# Patient Record
Sex: Female | Born: 1940
Health system: Southern US, Community
[De-identification: ages and names within clinical notes are randomized; demographics above are authoritative.]

## PROBLEM LIST (undated history)

## (undated) DIAGNOSIS — R3129 Other microscopic hematuria: Secondary | ICD-10-CM

## (undated) DIAGNOSIS — M353 Polymyalgia rheumatica: Secondary | ICD-10-CM

## (undated) DIAGNOSIS — K449 Diaphragmatic hernia without obstruction or gangrene: Secondary | ICD-10-CM

## (undated) DIAGNOSIS — R9431 Abnormal electrocardiogram [ECG] [EKG]: Secondary | ICD-10-CM

## (undated) DIAGNOSIS — K219 Gastro-esophageal reflux disease without esophagitis: Secondary | ICD-10-CM

## (undated) DIAGNOSIS — E559 Vitamin D deficiency, unspecified: Secondary | ICD-10-CM

## (undated) DIAGNOSIS — K760 Fatty (change of) liver, not elsewhere classified: Secondary | ICD-10-CM

## (undated) DIAGNOSIS — R9389 Abnormal findings on diagnostic imaging of other specified body structures: Secondary | ICD-10-CM

## (undated) DIAGNOSIS — R42 Dizziness and giddiness: Secondary | ICD-10-CM

## (undated) DIAGNOSIS — G8929 Other chronic pain: Secondary | ICD-10-CM

## (undated) DIAGNOSIS — Z1211 Encounter for screening for malignant neoplasm of colon: Secondary | ICD-10-CM

## (undated) DIAGNOSIS — M797 Fibromyalgia: Secondary | ICD-10-CM

## (undated) DIAGNOSIS — E538 Deficiency of other specified B group vitamins: Secondary | ICD-10-CM

## (undated) DIAGNOSIS — I10 Essential (primary) hypertension: Secondary | ICD-10-CM

## (undated) DIAGNOSIS — Z8719 Personal history of other diseases of the digestive system: Secondary | ICD-10-CM

## (undated) DIAGNOSIS — M47812 Spondylosis without myelopathy or radiculopathy, cervical region: Secondary | ICD-10-CM

## (undated) DIAGNOSIS — H269 Unspecified cataract: Secondary | ICD-10-CM

## (undated) DIAGNOSIS — M791 Myalgia, unspecified site: Secondary | ICD-10-CM

## (undated) DIAGNOSIS — M810 Age-related osteoporosis without current pathological fracture: Secondary | ICD-10-CM

## (undated) DIAGNOSIS — N8110 Cystocele, unspecified: Secondary | ICD-10-CM

## (undated) DIAGNOSIS — L501 Idiopathic urticaria: Secondary | ICD-10-CM

## (undated) DIAGNOSIS — M19012 Primary osteoarthritis, left shoulder: Secondary | ICD-10-CM

## (undated) DIAGNOSIS — Z87440 Personal history of urinary (tract) infections: Secondary | ICD-10-CM

## (undated) DIAGNOSIS — G629 Polyneuropathy, unspecified: Secondary | ICD-10-CM

## (undated) DIAGNOSIS — F419 Anxiety disorder, unspecified: Secondary | ICD-10-CM

## (undated) HISTORY — DX: Deficiency of other specified B group vitamins: E53.8

## (undated) HISTORY — DX: Age-related osteoporosis without current pathological fracture: M81.0

## (undated) HISTORY — DX: Gastro-esophageal reflux disease without esophagitis: K21.9

## (undated) HISTORY — DX: Polymyalgia rheumatica: M35.3

## (undated) HISTORY — DX: Vitamin D deficiency, unspecified: E55.9

## (undated) HISTORY — DX: Myalgia, unspecified site: M79.10

## (undated) HISTORY — DX: Polyneuropathy, unspecified: G62.9

## (undated) HISTORY — DX: Unspecified cataract: H26.9

## (undated) HISTORY — PX: BACK SURGERY: SHX140

## (undated) HISTORY — PX: EYE SURGERY: SHX253

## (undated) HISTORY — DX: Cystocele, unspecified: N81.10

## (undated) HISTORY — DX: Other microscopic hematuria: R31.29

## (undated) HISTORY — DX: Anxiety disorder, unspecified: F41.9

## (undated) HISTORY — PX: CHOLECYSTECTOMY: SHX55

## (undated) HISTORY — DX: Idiopathic urticaria: L50.1

## (undated) HISTORY — DX: Encounter for screening for malignant neoplasm of colon: Z12.11

## (undated) HISTORY — DX: Dizziness and giddiness: R42

## (undated) HISTORY — DX: Abnormal electrocardiogram (ECG) (EKG): R94.31

## (undated) HISTORY — DX: Other chronic pain: G89.29

## (undated) HISTORY — DX: Primary osteoarthritis, left shoulder: M19.012

## (undated) HISTORY — DX: Spondylosis without myelopathy or radiculopathy, cervical region: M47.812

## (undated) HISTORY — DX: Essential (primary) hypertension: I10

## (undated) HISTORY — PX: COLONOSCOPY: SHX174

---

## 2001-12-13 ENCOUNTER — Ambulatory Visit (HOSPITAL_COMMUNITY): Admission: RE | Admit: 2001-12-13 | Discharge: 2001-12-13 | Payer: Self-pay | Admitting: Family Medicine

## 2001-12-13 ENCOUNTER — Encounter: Payer: Self-pay | Admitting: Family Medicine

## 2002-01-03 ENCOUNTER — Emergency Department (HOSPITAL_COMMUNITY): Admission: EM | Admit: 2002-01-03 | Discharge: 2002-01-03 | Payer: Self-pay | Admitting: Emergency Medicine

## 2002-03-20 ENCOUNTER — Ambulatory Visit (HOSPITAL_COMMUNITY): Admission: RE | Admit: 2002-03-20 | Discharge: 2002-03-20 | Payer: Self-pay | Admitting: Internal Medicine

## 2002-03-20 ENCOUNTER — Encounter: Payer: Self-pay | Admitting: Internal Medicine

## 2002-12-05 ENCOUNTER — Encounter: Payer: Self-pay | Admitting: Family Medicine

## 2002-12-05 ENCOUNTER — Ambulatory Visit (HOSPITAL_COMMUNITY): Admission: RE | Admit: 2002-12-05 | Discharge: 2002-12-05 | Payer: Self-pay | Admitting: Family Medicine

## 2002-12-06 ENCOUNTER — Emergency Department (HOSPITAL_COMMUNITY): Admission: EM | Admit: 2002-12-06 | Discharge: 2002-12-06 | Payer: Self-pay | Admitting: Emergency Medicine

## 2003-07-10 ENCOUNTER — Ambulatory Visit (HOSPITAL_COMMUNITY): Admission: RE | Admit: 2003-07-10 | Discharge: 2003-07-10 | Payer: Self-pay | Admitting: Family Medicine

## 2003-07-10 ENCOUNTER — Encounter: Payer: Self-pay | Admitting: Family Medicine

## 2003-12-18 ENCOUNTER — Ambulatory Visit (HOSPITAL_COMMUNITY): Admission: RE | Admit: 2003-12-18 | Discharge: 2003-12-18 | Payer: Self-pay | Admitting: Family Medicine

## 2004-11-18 ENCOUNTER — Ambulatory Visit (HOSPITAL_COMMUNITY): Admission: RE | Admit: 2004-11-18 | Discharge: 2004-11-18 | Payer: Self-pay | Admitting: Family Medicine

## 2004-11-25 HISTORY — PX: HYSTEROSCOPY: SHX211

## 2004-12-16 ENCOUNTER — Ambulatory Visit (HOSPITAL_COMMUNITY): Admission: RE | Admit: 2004-12-16 | Discharge: 2004-12-16 | Payer: Self-pay | Admitting: Neurosurgery

## 2004-12-29 ENCOUNTER — Ambulatory Visit (HOSPITAL_COMMUNITY): Admission: RE | Admit: 2004-12-29 | Discharge: 2004-12-29 | Payer: Self-pay | Admitting: Family Medicine

## 2004-12-30 ENCOUNTER — Encounter (HOSPITAL_COMMUNITY): Admission: RE | Admit: 2004-12-30 | Discharge: 2005-01-29 | Payer: Self-pay | Admitting: Neurosurgery

## 2005-03-05 ENCOUNTER — Ambulatory Visit (HOSPITAL_COMMUNITY): Admission: RE | Admit: 2005-03-05 | Discharge: 2005-03-05 | Payer: Self-pay | Admitting: Neurological Surgery

## 2005-12-30 ENCOUNTER — Ambulatory Visit (HOSPITAL_COMMUNITY): Admission: RE | Admit: 2005-12-30 | Discharge: 2005-12-30 | Payer: Self-pay | Admitting: Family Medicine

## 2006-02-05 ENCOUNTER — Emergency Department (HOSPITAL_COMMUNITY): Admission: EM | Admit: 2006-02-05 | Discharge: 2006-02-05 | Payer: Self-pay | Admitting: Emergency Medicine

## 2006-06-30 ENCOUNTER — Encounter (HOSPITAL_COMMUNITY): Admission: RE | Admit: 2006-06-30 | Discharge: 2006-07-23 | Payer: Self-pay | Admitting: Podiatry

## 2007-01-17 ENCOUNTER — Ambulatory Visit (HOSPITAL_COMMUNITY): Admission: RE | Admit: 2007-01-17 | Discharge: 2007-01-17 | Payer: Self-pay | Admitting: Family Medicine

## 2007-01-20 ENCOUNTER — Ambulatory Visit (HOSPITAL_COMMUNITY): Admission: RE | Admit: 2007-01-20 | Discharge: 2007-01-20 | Payer: Self-pay | Admitting: Family Medicine

## 2007-08-03 ENCOUNTER — Ambulatory Visit (HOSPITAL_COMMUNITY): Admission: RE | Admit: 2007-08-03 | Discharge: 2007-08-03 | Payer: Self-pay | Admitting: Family Medicine

## 2007-12-18 ENCOUNTER — Ambulatory Visit (HOSPITAL_COMMUNITY): Admission: RE | Admit: 2007-12-18 | Discharge: 2007-12-18 | Payer: Self-pay | Admitting: Family Medicine

## 2008-05-02 ENCOUNTER — Emergency Department (HOSPITAL_COMMUNITY): Admission: EM | Admit: 2008-05-02 | Discharge: 2008-05-02 | Payer: Self-pay | Admitting: Emergency Medicine

## 2008-05-13 ENCOUNTER — Ambulatory Visit: Payer: Self-pay | Admitting: Orthopedic Surgery

## 2008-05-13 DIAGNOSIS — M12279 Villonodular synovitis (pigmented), unspecified ankle and foot: Secondary | ICD-10-CM | POA: Insufficient documentation

## 2008-05-13 DIAGNOSIS — M659 Synovitis and tenosynovitis, unspecified: Secondary | ICD-10-CM

## 2008-06-13 ENCOUNTER — Ambulatory Visit (HOSPITAL_COMMUNITY): Admission: RE | Admit: 2008-06-13 | Discharge: 2008-06-13 | Payer: Self-pay | Admitting: Pediatrics

## 2009-04-22 ENCOUNTER — Ambulatory Visit (HOSPITAL_COMMUNITY): Admission: RE | Admit: 2009-04-22 | Discharge: 2009-04-22 | Payer: Self-pay | Admitting: Pediatrics

## 2009-04-25 ENCOUNTER — Ambulatory Visit (HOSPITAL_COMMUNITY): Admission: RE | Admit: 2009-04-25 | Discharge: 2009-04-25 | Payer: Self-pay | Admitting: Pediatrics

## 2009-07-23 ENCOUNTER — Ambulatory Visit (HOSPITAL_COMMUNITY): Admission: RE | Admit: 2009-07-23 | Discharge: 2009-07-23 | Payer: Self-pay | Admitting: Pediatrics

## 2009-09-10 ENCOUNTER — Ambulatory Visit (HOSPITAL_COMMUNITY): Admission: RE | Admit: 2009-09-10 | Discharge: 2009-09-10 | Payer: Self-pay | Admitting: Pediatrics

## 2010-04-12 ENCOUNTER — Emergency Department (HOSPITAL_COMMUNITY): Admission: EM | Admit: 2010-04-12 | Discharge: 2010-04-12 | Payer: Self-pay | Admitting: Emergency Medicine

## 2010-04-30 ENCOUNTER — Emergency Department (HOSPITAL_COMMUNITY): Admission: EM | Admit: 2010-04-30 | Discharge: 2010-04-30 | Payer: Self-pay | Admitting: Emergency Medicine

## 2010-08-19 ENCOUNTER — Ambulatory Visit (HOSPITAL_COMMUNITY): Admission: RE | Admit: 2010-08-19 | Discharge: 2010-08-19 | Payer: Self-pay | Admitting: Family Medicine

## 2011-01-06 LAB — BASIC METABOLIC PANEL
Calcium: 9.5 mg/dL (ref 8.4–10.5)
Creatinine, Ser: 0.6 mg/dL (ref 0.4–1.2)
GFR calc Af Amer: >60 mL/min (ref >60)
GFR calc non Af Amer: >60 mL/min (ref >60)
Glucose, Bld: 89 mg/dL (ref 70–99)
Sodium: 139 mEq/L (ref 135–145)

## 2011-01-06 LAB — D-DIMER, QUANTITATIVE: D-Dimer, Quant: 0.37 ug/mL-FEU (ref 0.00–0.48)

## 2011-01-06 LAB — CBC
Hemoglobin: 13.1 g/dL (ref 12.0–15.0)
MCHC: 34.4 g/dL (ref 30.0–36.0)
Platelets: 213 10*3/uL (ref 150–400)
RBC: 4.08 MIL/uL (ref 3.87–5.11)

## 2011-01-10 LAB — URINE CULTURE: Colony Count: >=100000

## 2011-01-10 LAB — DIFFERENTIAL
Basophils Absolute: 0 10*3/uL (ref 0.0–0.1)
Basophils Relative: 1 % (ref 0–1)
Eosinophils Absolute: 0.3 10*3/uL (ref 0.0–0.7)
Monocytes Absolute: 0.7 10*3/uL (ref 0.1–1.0)
Neutro Abs: 3.2 10*3/uL (ref 1.7–7.7)

## 2011-01-10 LAB — CBC
MCV: 93.8 fL (ref 78.0–100.0)
Platelets: 223 10*3/uL (ref 150–400)
RBC: 3.86 MIL/uL — ABNORMAL LOW (ref 3.87–5.11)
RDW: 12.6 % (ref 11.5–15.5)
WBC: 6.4 10*3/uL (ref 4.0–10.5)

## 2011-01-10 LAB — BASIC METABOLIC PANEL
BUN: 9 mg/dL (ref 6–23)
Chloride: 107 mEq/L (ref 96–112)
Creatinine, Ser: 0.61 mg/dL (ref 0.4–1.2)
GFR calc Af Amer: >60 mL/min (ref >60)
GFR calc non Af Amer: >60 mL/min (ref >60)

## 2011-01-10 LAB — URINALYSIS, ROUTINE W REFLEX MICROSCOPIC
Bilirubin Urine: NEGATIVE
Ketones, ur: NEGATIVE mg/dL
Nitrite: POSITIVE — AB
Protein, ur: 100 mg/dL — AB
Specific Gravity, Urine: >1.03 — ABNORMAL HIGH (ref 1.005–1.030)
Urobilinogen, UA: 0.2 mg/dL (ref 0.0–1.0)

## 2011-10-15 ENCOUNTER — Emergency Department (HOSPITAL_COMMUNITY): Payer: Medicare Other

## 2011-10-15 ENCOUNTER — Encounter: Payer: Self-pay | Admitting: Emergency Medicine

## 2011-10-15 ENCOUNTER — Inpatient Hospital Stay (HOSPITAL_COMMUNITY)
Admission: EM | Admit: 2011-10-15 | Discharge: 2011-10-18 | DRG: 690 | Disposition: A | Discharge status: Home / Self Care | Payer: Medicare Other | Attending: Internal Medicine | Admitting: Internal Medicine

## 2011-10-15 DIAGNOSIS — R945 Abnormal results of liver function studies: Secondary | ICD-10-CM | POA: Diagnosis present

## 2011-10-15 DIAGNOSIS — M659 Synovitis and tenosynovitis, unspecified: Secondary | ICD-10-CM

## 2011-10-15 DIAGNOSIS — R9389 Abnormal findings on diagnostic imaging of other specified body structures: Secondary | ICD-10-CM

## 2011-10-15 DIAGNOSIS — N72 Inflammatory disease of cervix uteri: Secondary | ICD-10-CM | POA: Diagnosis present

## 2011-10-15 DIAGNOSIS — M12279 Villonodular synovitis (pigmented), unspecified ankle and foot: Secondary | ICD-10-CM

## 2011-10-15 DIAGNOSIS — R110 Nausea: Secondary | ICD-10-CM

## 2011-10-15 DIAGNOSIS — M797 Fibromyalgia: Secondary | ICD-10-CM

## 2011-10-15 DIAGNOSIS — D72829 Elevated white blood cell count, unspecified: Secondary | ICD-10-CM | POA: Diagnosis not present

## 2011-10-15 DIAGNOSIS — N10 Acute tubulo-interstitial nephritis: Principal | ICD-10-CM | POA: Diagnosis present

## 2011-10-15 DIAGNOSIS — N12 Tubulo-interstitial nephritis, not specified as acute or chronic: Secondary | ICD-10-CM

## 2011-10-15 DIAGNOSIS — D72819 Decreased white blood cell count, unspecified: Secondary | ICD-10-CM | POA: Diagnosis present

## 2011-10-15 DIAGNOSIS — E876 Hypokalemia: Secondary | ICD-10-CM

## 2011-10-15 DIAGNOSIS — IMO0001 Reserved for inherently not codable concepts without codable children: Secondary | ICD-10-CM | POA: Diagnosis present

## 2011-10-15 DIAGNOSIS — D696 Thrombocytopenia, unspecified: Secondary | ICD-10-CM

## 2011-10-15 HISTORY — DX: Fibromyalgia: M79.7

## 2011-10-15 HISTORY — DX: Abnormal findings on diagnostic imaging of other specified body structures: R93.89

## 2011-10-15 LAB — COMPREHENSIVE METABOLIC PANEL
AST: 23 U/L (ref 0–37)
Albumin: 3.8 g/dL (ref 3.5–5.2)
Alkaline Phosphatase: 119 U/L — ABNORMAL HIGH (ref 39–117)
Chloride: 102 mEq/L (ref 96–112)
Creatinine, Ser: 0.81 mg/dL (ref 0.50–1.10)
Potassium: 3.3 mEq/L — ABNORMAL LOW (ref 3.5–5.1)
Total Bilirubin: 0.5 mg/dL (ref 0.3–1.2)
Total Protein: 7.6 g/dL (ref 6.0–8.3)

## 2011-10-15 LAB — URINE MICROSCOPIC-ADD ON

## 2011-10-15 LAB — URINALYSIS, ROUTINE W REFLEX MICROSCOPIC
Glucose, UA: 100 mg/dL — AB
Ketones, ur: NEGATIVE mg/dL
Nitrite: POSITIVE — AB
Specific Gravity, Urine: <1.005 — ABNORMAL LOW (ref 1.005–1.030)
pH: 5 (ref 5.0–8.0)

## 2011-10-15 LAB — CBC
MCHC: 33.7 g/dL (ref 30.0–36.0)
Platelets: 177 10*3/uL (ref 150–400)
RDW: 12.7 % (ref 11.5–15.5)
WBC: 1.6 10*3/uL — ABNORMAL LOW (ref 4.0–10.5)

## 2011-10-15 LAB — DIFFERENTIAL
Basophils Absolute: 0 10*3/uL (ref 0.0–0.1)
Basophils Relative: 0 % (ref 0–1)
Lymphocytes Relative: 23 % (ref 12–46)
Neutro Abs: 1.2 10*3/uL — ABNORMAL LOW (ref 1.7–7.7)

## 2011-10-15 MED ORDER — ONDANSETRON HCL 4 MG/2ML IJ SOLN: 4.0000 mg | Freq: Three times a day (TID) | INTRAMUSCULAR | Status: DC | PRN

## 2011-10-15 MED ORDER — SODIUM CHLORIDE 0.9 % IV SOLN
Freq: Once | INTRAVENOUS | Status: AC
  Administered 2011-10-15: 1000 mL via INTRAVENOUS

## 2011-10-15 MED ORDER — ACETAMINOPHEN 325 MG PO TABS
650.0000 mg | ORAL_TABLET | Freq: Four times a day (QID) | ORAL | Status: DC | PRN
  Administered 2011-10-16: 650 mg via ORAL
  Filled 2011-10-15: qty 2

## 2011-10-15 MED ORDER — HYDROMORPHONE HCL PF 1 MG/ML IJ SOLN
1.0000 mg | INTRAMUSCULAR | Status: DC | PRN
Start: 2011-10-15 — End: 2011-10-17
  Administered 2011-10-15: 1 mg via INTRAVENOUS
  Filled 2011-10-15: qty 1

## 2011-10-15 MED ORDER — ACETAMINOPHEN 650 MG RE SUPP: 650.0000 mg | Freq: Four times a day (QID) | RECTAL | Status: DC | PRN

## 2011-10-15 MED ORDER — CIPROFLOXACIN IN D5W 400 MG/200ML IV SOLN
400.0000 mg | Freq: Once | INTRAVENOUS | Status: AC
  Administered 2011-10-15: 400 mg via INTRAVENOUS
  Filled 2011-10-15: qty 200

## 2011-10-15 MED ORDER — ONDANSETRON HCL 4 MG/2ML IJ SOLN
4.0000 mg | Freq: Once | INTRAMUSCULAR | Status: AC
  Administered 2011-10-15: 4 mg via INTRAVENOUS
  Filled 2011-10-15: qty 2

## 2011-10-15 MED ORDER — DULOXETINE HCL 60 MG PO CPEP
60.0000 mg | ORAL_CAPSULE | Freq: Every day | ORAL | Status: DC
  Administered 2011-10-16 – 2011-10-18 (×3): 60 mg via ORAL
  Filled 2011-10-15 (×3): qty 1

## 2011-10-15 MED ORDER — SODIUM CHLORIDE 0.9 % IV SOLN
INTRAVENOUS | Status: AC
  Administered 2011-10-15: 1000 mL via INTRAVENOUS

## 2011-10-15 MED ORDER — CIPROFLOXACIN IN D5W 400 MG/200ML IV SOLN
400.0000 mg | Freq: Two times a day (BID) | INTRAVENOUS | Status: DC
  Administered 2011-10-16 – 2011-10-17 (×3): 400 mg via INTRAVENOUS
  Filled 2011-10-15 (×5): qty 200

## 2011-10-15 MED ORDER — ALBUTEROL SULFATE (5 MG/ML) 0.5% IN NEBU: 2.5000 mg | INHALATION_SOLUTION | RESPIRATORY_TRACT | Status: DC | PRN

## 2011-10-15 MED ORDER — POTASSIUM CHLORIDE IN NACL 20-0.9 MEQ/L-% IV SOLN
INTRAVENOUS | Status: DC
  Administered 2011-10-15: 21:00:00 via INTRAVENOUS
  Administered 2011-10-16: 950 mL via INTRAVENOUS
  Administered 2011-10-17: 01:00:00 via INTRAVENOUS
  Administered 2011-10-17: 950 mL via INTRAVENOUS
  Administered 2011-10-18: 100 mL via INTRAVENOUS

## 2011-10-15 MED ORDER — HYDROMORPHONE HCL PF 1 MG/ML IJ SOLN
1.0000 mg | Freq: Once | INTRAMUSCULAR | Status: AC
  Administered 2011-10-15: 1 mg via INTRAVENOUS
  Filled 2011-10-15: qty 1

## 2011-10-15 MED ORDER — HYDROMORPHONE HCL PF 1 MG/ML IJ SOLN: 1.0000 mg | INTRAMUSCULAR | Status: DC | PRN

## 2011-10-15 MED ORDER — ONDANSETRON HCL 4 MG PO TABS: 4.0000 mg | ORAL_TABLET | Freq: Four times a day (QID) | ORAL | Status: DC | PRN

## 2011-10-15 MED ORDER — ONDANSETRON HCL 4 MG/2ML IJ SOLN
4.0000 mg | Freq: Four times a day (QID) | INTRAMUSCULAR | Status: DC | PRN
  Administered 2011-10-16 – 2011-10-17 (×2): 4 mg via INTRAVENOUS
  Filled 2011-10-15 (×2): qty 2

## 2011-10-15 MED ORDER — OXYCODONE HCL 5 MG PO TABS
5.0000 mg | ORAL_TABLET | ORAL | Status: DC | PRN
Start: 2011-10-15 — End: 2011-10-18

## 2011-10-15 MED ORDER — ENOXAPARIN SODIUM 40 MG/0.4ML ~~LOC~~ SOLN
40.0000 mg | SUBCUTANEOUS | Status: DC
  Administered 2011-10-15 – 2011-10-17 (×3): 40 mg via SUBCUTANEOUS
  Filled 2011-10-15 (×3): qty 0.4

## 2011-10-15 NOTE — ED Notes (Signed)
Pt had placed on bedpan and back off; clean gown given

## 2011-10-15 NOTE — H&P (Signed)
Karen Delacruz is an 70 y.o. female.    PCP: Dr. Milford Cage  Chief Complaint: Back pain and chills  HPI: This is a 70 year old, Caucasian female, with a past medical history of fibromyalgia, who was in her usual state of health till this morning when she went to the bathroom, had a lot of pressure-like sensation in the lower abdomen and could not urinate except for a few drops. Did not have any burning sensation, per se. Did not see any blood in the bladder in the urine. She also has some back pain, which she describes in the middle of the back the lower back. She went back to her bedrooms and sat on a chair. And, then she had sudden onset of chills. She felt very cold and started shaking all over. She had some nausea and episode of emesis. Denies taking her temperature at home. She tried taking one of the pyridium tablets with no relief. Denies any headache. Denies any diarrhea. She had her last urinary tract infection about a year ago. When her symptoms got worse today she called her husband and they decided to come in to the hospital. Had a Pap smear 2 years ago, which was normal. She does not have OB/GYN.   Prior to Admission medications   Medication Sig Start Date End Date Taking? Authorizing Provider  DULoxetine (CYMBALTA) 60 MG capsule Take 60 mg by mouth daily.     Yes Historical Provider, MD  naproxen sodium (ANAPROX) 220 MG tablet Take 220 mg by mouth every 8 (eight) hours as needed. For pain    Yes Historical Provider, MD    Allergies:  Allergies  Allergen Reactions  . Codeine Other (See Comments)    Feels funny  . Penicillins Rash  . Sulfonamide Derivatives Rash    Past Medical History  Diagnosis Date  . Fibromyalgia 10/15/2011    Past Surgical History  Procedure Date  . Back surgery   . Cholecystectomy     Social History:  reports that she has never smoked. She does not have any smokeless tobacco history on file. She reports that she does not drink alcohol or use illicit  drugs.  Family History:  Family History  Problem Relation Age of Onset  . Cancer Mother     Review of Systems - History obtained from the patient General ROS: positive for  - chills and fatigue Psychological ROS: negative Ophthalmic ROS: negative Allergy and Immunology ROS: negative Hematological and Lymphatic ROS: negative Endocrine ROS: negative Respiratory ROS: no cough, shortness of breath, or wheezing Cardiovascular ROS: negative Gastrointestinal ROS: positive for - nausea/vomiting Genito-Urinary ROS: as in hpi Musculoskeletal ROS: positive for - pain in back - both, both sides Neurological ROS: negative Dermatological ROS: negative  Physical Examination Blood pressure 104/50, pulse 93, temperature 99 F (37.2 C), temperature source Oral, resp. rate 18, height 5\' 8"  (1.727 m), weight 77.111 kg (170 lb), SpO2 94.00%.  General appearance: alert, cooperative, appears stated age and no distress Head: Normocephalic, without obvious abnormality, atraumatic Eyes: conjunctivae/corneas clear. PERRL, EOM's intact. Fundi benign. Throat: dry mm Neck: no adenopathy, no carotid bruit, no JVD, supple, symmetrical, trachea midline and thyroid not enlarged, symmetric, no tenderness/mass/nodules Back: Left CVA tenderness Resp: clear to auscultation bilaterally Cardio: regular rate and rhythm, S1, S2 normal, no murmur, click, rub or gallop GI: soft, mildly tender in lower abdomen, no rebound, normal BS, no masses or organomegaly Extremities: extremities normal, atraumatic, no cyanosis or edema Pulses: 2+ and symmetric Skin:  Skin color, texture, turgor normal. No rashes or lesions Lymph nodes: Cervical, supraclavicular, and axillary nodes normal. Neurologic: Alert and oriented X 3, normal strength and tone. Normal symmetric reflexes. Normal coordination and gait  Results for orders placed during the hospital encounter of 10/15/11 (from the past 48 hour(s))  CBC     Status: Abnormal    Collection Time   10/15/11  3:23 PM      Component Value Range Comment   WBC 1.6 (*) 4.0 - 10.5 (K/uL)    RBC 4.35  3.87 - 5.11 (MIL/uL)    Hemoglobin 13.7  12.0 - 15.0 (g/dL)    HCT 16.1  09.6 - 04.5 (%)    MCV 93.6  78.0 - 100.0 (fL)    MCH 31.5  26.0 - 34.0 (pg)    MCHC 33.7  30.0 - 36.0 (g/dL)    RDW 40.9  81.1 - 91.4 (%)    Platelets 177  150 - 400 (K/uL)   DIFFERENTIAL     Status: Abnormal   Collection Time   10/15/11  3:23 PM      Component Value Range Comment   Neutrophils Relative 77  43 - 77 (%)    Neutro Abs 1.2 (*) 1.7 - 7.7 (K/uL)    Lymphocytes Relative 23  12 - 46 (%)    Lymphs Abs 0.4 (*) 0.7 - 4.0 (K/uL)    Monocytes Relative 0 (*) 3 - 12 (%)    Monocytes Absolute 0.0 (*) 0.1 - 1.0 (K/uL)    Eosinophils Relative 1  0 - 5 (%)    Eosinophils Absolute 0.0  0.0 - 0.7 (K/uL)    Basophils Relative 0  0 - 1 (%)    Basophils Absolute 0.0  0.0 - 0.1 (K/uL)    WBC Morphology MILD LEFT SHIFT (1-5% METAS, OCC MYELO, OCC BANDS)     COMPREHENSIVE METABOLIC PANEL     Status: Abnormal   Collection Time   10/15/11  3:23 PM      Component Value Range Comment   Sodium 139  135 - 145 (mEq/L)    Potassium 3.3 (*) 3.5 - 5.1 (mEq/L)    Chloride 102  96 - 112 (mEq/L)    CO2 23  19 - 32 (mEq/L)    Glucose, Bld 84  70 - 99 (mg/dL)    BUN 14  6 - 23 (mg/dL)    Creatinine, Ser 7.82  0.50 - 1.10 (mg/dL)    Calcium 9.9  8.4 - 10.5 (mg/dL)    Total Protein 7.6  6.0 - 8.3 (g/dL)    Albumin 3.8  3.5 - 5.2 (g/dL)    AST 23  0 - 37 (U/L)    ALT 16  0 - 35 (U/L)    Alkaline Phosphatase 119 (*) 39 - 117 (U/L)    Total Bilirubin 0.5  0.3 - 1.2 (mg/dL)    GFR calc non Af Amer 72 (*) >90 (mL/min)    GFR calc Af Amer 84 (*) >90 (mL/min)   LIPASE, BLOOD     Status: Normal   Collection Time   10/15/11  3:23 PM      Component Value Range Comment   Lipase 44  11 - 59 (U/L)   URINALYSIS, ROUTINE W REFLEX MICROSCOPIC     Status: Abnormal   Collection Time   10/15/11  5:11 PM      Component  Value Range Comment   Color, Urine ORANGE (*) YELLOW  BIOCHEMICALS MAY BE AFFECTED  BY COLOR   APPearance HAZY (*) CLEAR     Specific Gravity, Urine <1.005 (*) 1.005 - 1.030     pH 5.0  5.0 - 8.0     Glucose, UA 100 (*) NEGATIVE (mg/dL)    Hgb urine dipstick TRACE (*) NEGATIVE     Bilirubin Urine SMALL (*) NEGATIVE     Ketones, ur NEGATIVE  NEGATIVE (mg/dL)    Protein, ur 30 (*) NEGATIVE (mg/dL)    Urobilinogen, UA 4.0 (*) 0.0 - 1.0 (mg/dL)    Nitrite POSITIVE (*) NEGATIVE     Leukocytes, UA MODERATE (*) NEGATIVE    URINE MICROSCOPIC-ADD ON     Status: Abnormal   Collection Time   10/15/11  5:11 PM      Component Value Range Comment   Squamous Epithelial / LPF FEW (*) RARE     WBC, UA TOO NUMEROUS TO COUNT  <3 (WBC/hpf)    RBC / HPF 0-2  <3 (RBC/hpf)    Bacteria, UA FEW (*) RARE     Ct Abdomen Pelvis Wo Contrast  10/15/2011  *RADIOLOGY REPORT*  Clinical Data: Back pain and urinary retention  CT ABDOMEN AND PELVIS WITHOUT CONTRAST  Technique:  Multidetector CT imaging of the abdomen and pelvis was performed following the standard protocol without intravenous contrast.  Comparison: April 25, 2009  Findings: No hydronephrosis or renal calculus.  No definite ureteral calculus.  Diffuse hepatic steatosis with relative sparing adjacent to the gallbladder fossa.  Post cholecystectomy.  Spleen, pancreas, kidneys, adrenal glands are within normal limits.  Stable large hiatal hernia.  Normal appendix.  Unremarkable bladder.  Adnexa are within normal limits. There is a thickened low density in the endometrial canal of the uterus towards the cervix of unknown significance.  No free fluid.  IMPRESSION: No evidence of urinary obstruction or urinary calculus.  Possible endometrial pathology in the lower uterus.  Ultrasound is recommended to further characterize.  Original Report Authenticated By: Donavan Burnet, M.D.     Assessment/Plan  Principal Problem:  *Acute pyelonephritis Active Problems:   Leukopenia  Hypokalemia  Fibromyalgia   #1 acute pyelonephritis with dehydration: This will be treated with ciprofloxacin intravenously. Urine cultures will be followed up on. Patient will be hydrated.  #2 abnormal uterus on CT: Pelvic ultrasound will be ordered. Patient does not report any kind of vaginal bleeding or discharge.  #3 leukopenia: This could be from the infection. Blood counts be followed up closely. TSH, B12 levels will be checked.  #4 mild hypokalemia: This will be repleted intravenously for now.  #5 history of fibromyalgia: Continue with Cymbalta.  DVT, prophylaxis will be initiated. Patient is a full code.  Further management decisions will depend on results of further testing and patient's response to treatment.  [facility]  [facility] Pager [phone]  10/15/2011, 7:39 PM

## 2011-10-15 NOTE — ED Provider Notes (Signed)
Scribed for Geoffery Lyons, MD, the patient was seen in room APA11/[facility]11 . This chart was scribed by Ellie Lunch.   CSN: [id number]  Arrival date & time 10/15/11  1401   First MD Initiated Contact with Patient 10/15/11 1500      Chief Complaint  Patient presents with  . Back Pain  . Urinary Retention  . Nausea    (Consider location/radiation/quality/duration/timing/severity/associated sxs/prior treatment) HPI Pt seen at 3:04 PM Karen Delacruz is a 70 y.o. female who presents to the Emergency Department complaining of 2 hours of sudden onset, severe, bilatral lower back pain. This problem is new. Pain does not radiate. Pt was sitting when pain began. Pt denies any injury or trauma. Pt denies any bowel or bladder incontinence. Pt denies a h/o similar sx. C/o associated chills. Pain worsens with movement. Pt has a h/o back pain and has had 2 prior surgeries, but denies the current pain is similar. No h/o of kidney stones.   PCP Dr. Anastasia Fiedler History reviewed. No pertinent past medical history.  Past Surgical History  Procedure Date  . Back surgery     History reviewed. No pertinent family history.  History  Substance Use Topics  . Smoking status: Not on file  . Smokeless tobacco: Not on file  . Alcohol Use: No     Review of Systems 10 Systems reviewed and are negative for acute change except as noted in the HPI.   Allergies  Codeine and Sulfonamide derivatives  Home Medications   Current Outpatient Rx  Name Route Sig Dispense Refill  . NAPROXEN SODIUM 220 MG PO TABS Oral Take 220 mg by mouth every 8 (eight) hours as needed. For pain       BP 147/103  Pulse 102  Temp(Src) 97.9 F (36.6 C) (Oral)  Resp 18  Ht 5\' 8"  (1.727 m)  Wt 170 lb (77.111 kg)  BMI 25.85 kg/m2  SpO2 95%  Physical Exam  Nursing note and vitals reviewed. Constitutional: She is oriented to person, place, and time. She appears well-developed and well-nourished.       Appears uncomfortable    HENT:  Head: Normocephalic and atraumatic.  Eyes: Conjunctivae are normal. No scleral icterus.  Neck: Neck supple.  Cardiovascular: Normal rate, regular rhythm and normal heart sounds.   Pulmonary/Chest: Effort normal and breath sounds normal. No respiratory distress.  Abdominal: Soft. There is tenderness. There is no rebound and no guarding.       abdominal tenderness present in all 4 quadrants with no rebound or gaurding. No CVA tenderness.  Musculoskeletal: Normal range of motion.        Tender lower lumbar spine.   Neurological: She is alert and oriented to person, place, and time. She has normal strength.  Reflex Scores:      Patellar reflexes are 1+ on the right side and 1+ on the left side.      Strength symmetrical and normal BLE  Skin: Skin is warm and dry.    ED Course  Procedures (including critical care time) DIAGNOSTIC STUDIES: Oxygen Saturation is 95% on RA, adequate by my interpretation.    COORDINATION OF CARE:   Labs Reviewed  CBC - Abnormal; Notable for the following:    WBC 1.6 (*)    All other components within normal limits  DIFFERENTIAL - Abnormal; Notable for the following:    Neutro Abs 1.2 (*)    Lymphs Abs 0.4 (*)    Monocytes Relative 0 (*)  Monocytes Absolute 0.0 (*)    All other components within normal limits  COMPREHENSIVE METABOLIC PANEL - Abnormal; Notable for the following:    Potassium 3.3 (*)    Alkaline Phosphatase 119 (*)    GFR calc non Af Amer 72 (*)    GFR calc Af Amer 84 (*)    All other components within normal limits  LIPASE, BLOOD  URINALYSIS, ROUTINE W REFLEX MICROSCOPIC   Ct Abdomen Pelvis Wo Contrast  10/15/2011  *RADIOLOGY REPORT*  Clinical Data: Back pain and urinary retention  CT ABDOMEN AND PELVIS WITHOUT CONTRAST  Technique:  Multidetector CT imaging of the abdomen and pelvis was performed following the standard protocol without intravenous contrast.  Comparison: April 25, 2009  Findings: No hydronephrosis or renal  calculus.  No definite ureteral calculus.  Diffuse hepatic steatosis with relative sparing adjacent to the gallbladder fossa.  Post cholecystectomy.  Spleen, pancreas, kidneys, adrenal glands are within normal limits.  Stable large hiatal hernia.  Normal appendix.  Unremarkable bladder.  Adnexa are within normal limits. There is a thickened low density in the endometrial canal of the uterus towards the cervix of unknown significance.  No free fluid.  IMPRESSION: No evidence of urinary obstruction or urinary calculus.  Possible endometrial pathology in the lower uterus.  Ultrasound is recommended to further characterize.  Original Report Authenticated By: Donavan Burnet, M.D.    3:08 PM Discussed plan to CT abdomen, run labs,  and treat pain.   ED MEDICATIONS Medications  HYDROmorphone (DILAUDID) injection 1 mg (1 mg Intravenous Given 10/15/11 1530)    No diagnosis found.    MDM  Patient appeared acutely ill upon arrival.  She is improving with meds and fluids.  Workup shows ct that is no acute process, WBC of 1.6, and UA showing uti.  Will consult medicine for admission.    I personally performed the services described in this documentation, which was scribed in my presence. The recorded information has been reviewed and considered.         Geoffery Lyons, MD 10/15/11 [phone]

## 2011-10-15 NOTE — ED Notes (Signed)
Pt c/o urinary retention, back pain and nausea.

## 2011-10-15 NOTE — ED Notes (Signed)
Pt c/o lower back pain that started 2 hours PTA and became unable to urinate.

## 2011-10-16 ENCOUNTER — Encounter (HOSPITAL_COMMUNITY): Payer: Self-pay | Admitting: Internal Medicine

## 2011-10-16 ENCOUNTER — Inpatient Hospital Stay (HOSPITAL_COMMUNITY): Payer: Medicare Other

## 2011-10-16 DIAGNOSIS — D72829 Elevated white blood cell count, unspecified: Secondary | ICD-10-CM | POA: Diagnosis not present

## 2011-10-16 DIAGNOSIS — D696 Thrombocytopenia, unspecified: Secondary | ICD-10-CM | POA: Diagnosis not present

## 2011-10-16 DIAGNOSIS — R9389 Abnormal findings on diagnostic imaging of other specified body structures: Secondary | ICD-10-CM | POA: Diagnosis present

## 2011-10-16 HISTORY — DX: Abnormal findings on diagnostic imaging of other specified body structures: R93.89

## 2011-10-16 LAB — CBC
Hemoglobin: 12.1 g/dL (ref 12.0–15.0)
MCV: 94.4 fL (ref 78.0–100.0)
Platelets: 138 10*3/uL — ABNORMAL LOW (ref 150–400)
RBC: 3.78 MIL/uL — ABNORMAL LOW (ref 3.87–5.11)
WBC: 15.9 10*3/uL — ABNORMAL HIGH (ref 4.0–10.5)

## 2011-10-16 LAB — COMPREHENSIVE METABOLIC PANEL
Albumin: 2.9 g/dL — ABNORMAL LOW (ref 3.5–5.2)
BUN: 16 mg/dL (ref 6–23)
Calcium: 8 mg/dL — ABNORMAL LOW (ref 8.4–10.5)
Chloride: 106 mEq/L (ref 96–112)
Creatinine, Ser: 0.72 mg/dL (ref 0.50–1.10)
GFR calc non Af Amer: 86 mL/min — ABNORMAL LOW (ref >90)
Total Bilirubin: 0.4 mg/dL (ref 0.3–1.2)

## 2011-10-16 LAB — VITAMIN B12: Vitamin B-12: 159 pg/mL — ABNORMAL LOW (ref 211–911)

## 2011-10-16 MED ORDER — CIPROFLOXACIN IN D5W 400 MG/200ML IV SOLN
INTRAVENOUS | Status: AC
  Filled 2011-10-16: qty 200

## 2011-10-16 MED ORDER — SODIUM CHLORIDE 0.9 % IJ SOLN
INTRAMUSCULAR | Status: AC
  Administered 2011-10-16: 10 mL
  Filled 2011-10-16: qty 3

## 2011-10-16 MED ORDER — PROMETHAZINE HCL 25 MG/ML IJ SOLN
12.5000 mg | Freq: Once | INTRAMUSCULAR | Status: AC
Start: 2011-10-16 — End: 2011-10-16
  Administered 2011-10-16: 12.5 mg via INTRAVENOUS
  Filled 2011-10-16: qty 1

## 2011-10-16 NOTE — Progress Notes (Signed)
Pt continue to complain of nausea despite Zofran given and crackers with ginger ale.  Writer made MD aware and received new orders and followed.  Pt made aware of new orders.

## 2011-10-16 NOTE — Progress Notes (Signed)
Subjective: Her nausea and vomiting have subsided. She has less low back pain.  Objective: Vital signs in last 24 hours: Filed Vitals:   10/15/11 1832 10/15/11 2101 10/16/11 0224 10/16/11 0548  BP: 104/50 112/70 104/65 103/66  Pulse: 93 85 97 87  Temp: 99 F (37.2 C) 98.9 F (37.2 C) 98.8 F (37.1 C) 98.5 F (36.9 C)  TempSrc: Oral Oral Oral Oral  Resp:  20 16 16   Height:  5\' 8"  (1.727 m)    Weight:  81 kg (178 lb 9.2 oz)  83 kg (182 lb 15.7 oz)  SpO2: 94% 92% 91% 94%    Intake/Output Summary (Last 24 hours) at 10/16/11 1039 Last data filed at 10/16/11 0252  Gross per 24 hour  Intake      0 ml  Output    275 ml  Net   -275 ml    Weight change:   Physical exam: Lungs: Clear to auscultation bilaterally. Heart: S1, S2, with no murmurs rubs or gallops. Abdomen: Obese, positive bowel sounds, nontender, nondistended. No flank tenderness. Extremities: No pedal edema.  Lab Results: Basic Metabolic Panel:  Basename 10/16/11 0645 10/15/11 1523  NA 137 139  K 4.1 3.3*  CL 106 102  CO2 25 23  GLUCOSE 135* 84  BUN 16 14  CREATININE 0.72 0.81  CALCIUM 8.0* 9.9  MG -- --  PHOS -- --   Liver Function Tests:  [facility] 10/16/11 0645 10/15/11 1523  AST 117* 23  ALT 81* 16  ALKPHOS 95 119*  BILITOT 0.4 0.5  PROT 6.2 7.6  ALBUMIN 2.9* 3.8    Basename 10/15/11 1523  LIPASE 44  AMYLASE --   No results found for this basename: AMMONIA:2 in the last 72 hours CBC:  Basename 10/16/11 0645 10/15/11 1523  WBC 15.9* 1.6*  NEUTROABS -- 1.2*  HGB 12.1 13.7  HCT 35.7* 40.7  MCV 94.4 93.6  PLT 138* 177   Cardiac Enzymes: No results found for this basename: CKTOTAL:3,CKMB:3,CKMBINDEX:3,TROPONINI:3 in the last 72 hours BNP: No components found with this basename: POCBNP:3 D-Dimer: No results found for this basename: DDIMER:2 in the last 72 hours CBG: No results found for this basename: GLUCAP:6 in the last 72 hours Hemoglobin A1C: No results found for this basename:  HGBA1C in the last 72 hours Fasting Lipid Panel: No results found for this basename: CHOL,HDL,LDLCALC,TRIG,CHOLHDL,LDLDIRECT in the last 72 hours Thyroid Function Tests: No results found for this basename: TSH,T4TOTAL,FREET4,T3FREE,THYROIDAB in the last 72 hours Anemia Panel: No results found for this basename: VITAMINB12,FOLATE,FERRITIN,TIBC,IRON,RETICCTPCT in the last 72 hours Coagulation: No results found for this basename: LABPROT:2,INR:2 in the last 72 hours Urine Drug Screen: Drugs of Abuse  No results found for this basename: labopia, cocainscrnur, labbenz, amphetmu, thcu, labbarb    Alcohol Level: No results found for this basename: ETH:2 in the last 72 hours   Micro: No results found for this or any previous visit (from the past 240 hour(s)).  Studies/Results: Ct Abdomen Pelvis Wo Contrast  10/15/2011  *RADIOLOGY REPORT*  Clinical Data: Back pain and urinary retention  CT ABDOMEN AND PELVIS WITHOUT CONTRAST  Technique:  Multidetector CT imaging of the abdomen and pelvis was performed following the standard protocol without intravenous contrast.  Comparison: April 25, 2009  Findings: No hydronephrosis or renal calculus.  No definite ureteral calculus.  Diffuse hepatic steatosis with relative sparing adjacent to the gallbladder fossa.  Post cholecystectomy.  Spleen, pancreas, kidneys, adrenal glands are within normal limits.  Stable large hiatal hernia.  Normal appendix.  Unremarkable bladder.  Adnexa are within normal limits. There is a thickened low density in the endometrial canal of the uterus towards the cervix of unknown significance.  No free fluid.  IMPRESSION: No evidence of urinary obstruction or urinary calculus.  Possible endometrial pathology in the lower uterus.  Ultrasound is recommended to further characterize.  Original Report Authenticated By: Donavan Burnet, M.D.    Medications: I have reviewed the patient's current medications.  Assessment: Principal Problem:   *Acute pyelonephritis Active Problems:  Leukopenia  Hypokalemia  Fibromyalgia  Leukocytosis  Thrombocytopenia  Elevated LFTs  Thickened endometrium  1. Acute pyelonephritis. Urine culture is pending. CT scan revealed no evidence of pyelonephritis, however, clinically she does have. She is on IV Cipro and she is feeling better.  Thickened endometrium seen on the CT scan. A pelvic ultrasound has been ordered and the result is pending.  Elevated liver transaminases. She has a history of cholecystectomy. Her liver transaminases will be followed. Of note, they were within normal limits on admission but have increased today. She has no right upper quadrant tenderness.  Initial leukopenia and now leukocytosis. These laboratory results are suspect. Her white blood cell count will be followed.  Thrombocytopenia. Her platelet count was within normal limits on admission and today it is lower. It will be followed.  Hypokalemia, repleted.  Plan:  Continue supportive treatment, IV fluids, etc.  We'll check the results of TSH, vitamin B 12, and RBC folate pending. We'll also check the results of the urine culture when completed.  Will order appropriate labs in the morning.   LOS: 1 day   Harriette Tovey 10/16/2011, 10:39 AM

## 2011-10-17 DIAGNOSIS — R110 Nausea: Secondary | ICD-10-CM | POA: Diagnosis present

## 2011-10-17 LAB — DIFFERENTIAL
Eosinophils Relative: 1 % (ref 0–5)
Lymphocytes Relative: 8 % — ABNORMAL LOW (ref 12–46)
Lymphs Abs: 0.9 10*3/uL (ref 0.7–4.0)
Monocytes Absolute: 0.5 10*3/uL (ref 0.1–1.0)
Monocytes Relative: 5 % (ref 3–12)

## 2011-10-17 LAB — HEPATIC FUNCTION PANEL
Albumin: 2.9 g/dL — ABNORMAL LOW (ref 3.5–5.2)
Indirect Bilirubin: 0.5 mg/dL (ref 0.3–0.9)
Total Protein: 6.4 g/dL (ref 6.0–8.3)

## 2011-10-17 LAB — CBC
HCT: 37.2 % (ref 36.0–46.0)
Hemoglobin: 12.8 g/dL (ref 12.0–15.0)
MCV: 93.7 fL (ref 78.0–100.0)
RBC: 3.97 MIL/uL (ref 3.87–5.11)
WBC: 11 10*3/uL — ABNORMAL HIGH (ref 4.0–10.5)

## 2011-10-17 LAB — BASIC METABOLIC PANEL
CO2: 23 mEq/L (ref 19–32)
Chloride: 104 mEq/L (ref 96–112)
Sodium: 134 mEq/L — ABNORMAL LOW (ref 135–145)

## 2011-10-17 MED ORDER — HYDROMORPHONE HCL PF 1 MG/ML IJ SOLN
0.5000 mg | INTRAMUSCULAR | Status: DC | PRN
Start: 2011-10-17 — End: 2011-10-18

## 2011-10-17 MED ORDER — DEXTROSE 5 % IV SOLN
1.0000 g | INTRAVENOUS | Status: DC
  Administered 2011-10-17 – 2011-10-18 (×2): 1 g via INTRAVENOUS
  Filled 2011-10-17 (×4): qty 10

## 2011-10-17 NOTE — Progress Notes (Signed)
Writer called lab regarding urine culture and was told to call [facility] where urine cultures are sent and read.  Per lab tech there was no record of urine culture done and stated they don't understand why it was not completed.  Md made aware.

## 2011-10-17 NOTE — Progress Notes (Signed)
Chart reviewed.  Subjective: Still nauseated. Flank pain improved. No other complaints.  Objective: Vital signs in last 24 hours: Filed Vitals:   10/16/11 2227 10/17/11 0203 10/17/11 0500 10/17/11 0800  BP: 148/90 114/71 170/107 142/88  Pulse: 81 79 83 73  Temp: 97.9 F (36.6 C) 98.5 F (36.9 C) 98.2 F (36.8 C)   TempSrc: Oral Oral Oral   Resp: 20 16 16 18   Height:      Weight:   84 kg (185 lb 3 oz)   SpO2: 92% 92% 92%    Weight change: 6.888 kg (15 lb 3 oz)  Intake/Output Summary (Last 24 hours) at 10/17/11 1055 Last data filed at 10/17/11 0900  Gross per 24 hour  Intake 3142.83 ml  Output   1050 ml  Net 2092.83 ml   Physical Exam: General: Weak appearing. HEENT dry mucous membranes Lungs clear to auscultation bilaterally without wheeze rhonchi or rales Cardiovascular regular rate rhythm without murmurs gallops rubs Abdomen soft nontender nondistended Back minimal left CVA tenderness Extremities no clubbing cyanosis or edema  Lab Results: Basic Metabolic Panel:  Lab 10/17/11 4098 10/16/11 0645  NA 134* 137  K 3.4* 4.1  CL 104 106  CO2 23 25  GLUCOSE 115* 135*  BUN 11 16  CREATININE 0.61 0.72  CALCIUM 8.6 8.0*  MG -- --  PHOS -- --   Liver Function Tests:  Lab 10/17/11 0630 10/16/11 0645  AST 58* 117*  ALT 62* 81*  ALKPHOS 90 95  BILITOT 0.6 0.4  PROT 6.4 6.2  ALBUMIN 2.9* 2.9*    Lab 10/15/11 1523  LIPASE 44  AMYLASE --   No results found for this basename: AMMONIA:2 in the last 168 hours CBC:  Lab 10/17/11 0630 10/16/11 0645 10/15/11 1523  WBC 11.0* 15.9* --  NEUTROABS 9.6* -- 1.2*  HGB 12.8 12.1 --  HCT 37.2 35.7* --  MCV 93.7 94.4 --  PLT 126* 138* --   Cardiac Enzymes: No results found for this basename: CKTOTAL:3,CKMB:3,CKMBINDEX:3,TROPONINI:3 in the last 168 hours BNP: No components found with this basename: POCBNP:3 D-Dimer: No results found for this basename: DDIMER:2 in the last 168 hours CBG: No results found for this  basename: GLUCAP:6 in the last 168 hours Hemoglobin A1C: No results found for this basename: HGBA1C in the last 168 hours Fasting Lipid Panel: No results found for this basename: CHOL,HDL,LDLCALC,TRIG,CHOLHDL,LDLDIRECT in the last 119 hours Thyroid Function Tests:  Lab 10/16/11 0645  TSH 0.665  T4TOTAL --  FREET4 --  T3FREE --  THYROIDAB --   Coagulation: No results found for this basename: LABPROT:4,INR:4 in the last 168 hours Anemia Panel:  Lab 10/16/11 0645  VITAMINB12 159*  FOLATE --  FERRITIN --  TIBC --  IRON --  RETICCTPCT --    Micro Results: Pending  Studies/Results: Ct Abdomen Pelvis Wo Contrast  10/15/2011  *RADIOLOGY REPORT*  Clinical Data: Back pain and urinary retention  CT ABDOMEN AND PELVIS WITHOUT CONTRAST  Technique:  Multidetector CT imaging of the abdomen and pelvis was performed following the standard protocol without intravenous contrast.  Comparison: April 25, 2009  Findings: No hydronephrosis or renal calculus.  No definite ureteral calculus.  Diffuse hepatic steatosis with relative sparing adjacent to the gallbladder fossa.  Post cholecystectomy.  Spleen, pancreas, kidneys, adrenal glands are within normal limits.  Stable large hiatal hernia.  Normal appendix.  Unremarkable bladder.  Adnexa are within normal limits. There is a thickened low density in the endometrial canal of the uterus  towards the cervix of unknown significance.  No free fluid.  IMPRESSION: No evidence of urinary obstruction or urinary calculus.  Possible endometrial pathology in the lower uterus.  Ultrasound is recommended to further characterize.  Original Report Authenticated By: Donavan Burnet, M.D.   US Transvaginal Non-ob  10/16/2011  *RADIOLOGY REPORT*  Clinical Data: Possible endometrial pathology is suggested on previous CT  TRANSABDOMINAL AND TRANSVAGINAL ULTRASOUND OF PELVIS Technique:  Both transabdominal and transvaginal ultrasound examinations of the pelvis were performed.  Transabdominal technique was performed for global imaging of the pelvis including uterus, ovaries, adnexal regions, and pelvic cul-de-sac.  Comparison: CT 10/15/2011   It was necessary to proceed with endovaginal exam following the transabdominal exam to visualize the endometrium to advantage.  Findings:  Uterus: 2.7 x 4.8 x 5.3 cm.  There is an 8 mm Nabothian cyst.  Endometrium: 4.2 mm in thickness, unremarkable  Right ovary:  Not visualized  Left ovary: Not visualized  Other findings: There is a trace amount of free pelvic fluid, probably physiologic.  IMPRESSION:  1.  8 mm Nabothian cyst which may account for the low density region seen on prior CT. 2.  No evidence of endometrial pathology. 3.  Ovaries not visualized.  Original Report Authenticated By: Osa Craver, M.D.   US Pelvis Complete  10/16/2011  *RADIOLOGY REPORT*  Clinical Data: Possible endometrial pathology is suggested on previous CT  TRANSABDOMINAL AND TRANSVAGINAL ULTRASOUND OF PELVIS Technique:  Both transabdominal and transvaginal ultrasound examinations of the pelvis were performed. Transabdominal technique was performed for global imaging of the pelvis including uterus, ovaries, adnexal regions, and pelvic cul-de-sac.  Comparison: CT 10/15/2011   It was necessary to proceed with endovaginal exam following the transabdominal exam to visualize the endometrium to advantage.  Findings:  Uterus: 2.7 x 4.8 x 5.3 cm.  There is an 8 mm Nabothian cyst.  Endometrium: 4.2 mm in thickness, unremarkable  Right ovary:  Not visualized  Left ovary: Not visualized  Other findings: There is a trace amount of free pelvic fluid, probably physiologic.  IMPRESSION:  1.  8 mm Nabothian cyst which may account for the low density region seen on prior CT. 2.  No evidence of endometrial pathology. 3.  Ovaries not visualized.  Original Report Authenticated By: Thora Lance III, M.D.   Scheduled Meds:   . cefTRIAXone (ROCEPHIN)  IV  1 g Intravenous  Q24H  . DULoxetine  60 mg Oral Daily  . enoxaparin  40 mg Subcutaneous Q24H  . promethazine  12.5 mg Intravenous Once  . sodium chloride      . DISCONTD: ciprofloxacin  400 mg Intravenous Q12H   Continuous Infusions:   . 0.9 % NaCl with KCl 20 mEq / L 70 mL/hr at 10/17/11 0121   PRN Meds:.acetaminophen, albuterol, HYDROmorphone, ondansetron (ZOFRAN) IV, ondansetron, oxyCODONE, DISCONTD: acetaminophen, DISCONTD: HYDROmorphone Assessment/Plan: Principal Problem:  *Acute pyelonephritis Active Problems:  Hypokalemia  Nausea  Leukopenia  Fibromyalgia  Thrombocytopenia  Elevated LFTs, improved, and do to acute illness. Abnormal CT of the pelvis is from a nabothian cyst, not endometrial thickening.  Patient reports that her nausea started after she was admitted. It is possible that Cipro was causing her nausea. Urine culture results are still pending. I've asked the nurse to check on this. I will change her to Rocephin to see if her nausea abates. She appears dehydrated on physical examination. Increase IV fluids. Replete potassium IV.   LOS: 2 days   Evyn Putzier L 10/17/2011, 10:55  AM

## 2011-10-18 LAB — FOLATE RBC: RBC Folate: 435 ng/mL (ref >=366)

## 2011-10-18 MED ORDER — CEFUROXIME AXETIL 500 MG PO TABS: 500.0000 mg | ORAL_TABLET | Freq: Two times a day (BID) | ORAL | Status: AC

## 2011-10-18 MED ORDER — DULOXETINE HCL 60 MG PO CPEP: 60.0000 mg | ORAL_CAPSULE | Freq: Every day | ORAL | Status: DC

## 2011-10-18 NOTE — Progress Notes (Signed)
The patient is alert and oriented and condition stable. Discharged to home with husband. Iv discontinued, taught re medication and disease process, appointments, and ways to prevent disease.  Escorted via wheel chair and verbalized understanding.

## 2011-10-18 NOTE — Discharge Summary (Signed)
Physician Discharge Summary  Patient ID: Karen Delacruz MRN: [id number] DOB/AGE: [birth year] 70 y.o.  Admit date: 10/15/2011 Discharge date: 10/18/2011  Discharge Diagnoses:  Principal Problem:  *Acute pyelonephritis Active Problems:  Hypokalemia  Nausea  Leukopenia, resolved  Fibromyalgia  Thrombocytopenia due to infection  Elevated LFTs due to infection  Nabothian cyst   Current Discharge Medication List    START taking these medications   Details  cefUROXime (CEFTIN) 500 MG tablet Take 1 tablet (500 mg total) by mouth 2 (two) times daily. Qty: 20 tablet, Refills: 0      CONTINUE these medications which have CHANGED   Details  DULoxetine (CYMBALTA) 60 MG capsule Take 1 capsule (60 mg total) by mouth daily. Qty: 30 capsule, Refills: 0      CONTINUE these medications which have NOT CHANGED   Details  naproxen sodium (ANAPROX) 220 MG tablet Take 220 mg by mouth every 8 (eight) hours as needed. For pain         Discharge Orders    Future Orders Please Complete By Expires   Diet general      Increase activity slowly      Discharge instructions      Comments:   Drink plenty of liquids.      Follow-up Information    Follow up with Ara Kussmaul, MD. (If symptoms worsen)          Disposition:  home  Discharged Condition: Stable  Consults:   none  Labs:   Results for orders placed during the hospital encounter of 10/15/11 (from the past 48 hour(s))  HEPATIC FUNCTION PANEL     Status: Abnormal   Collection Time   10/17/11  6:30 AM      Component Value Range Comment   Total Protein 6.4  6.0 - 8.3 (g/dL)    Albumin 2.9 (*) 3.5 - 5.2 (g/dL)    AST 58 (*) 0 - 37 (U/L)    ALT 62 (*) 0 - 35 (U/L)    Alkaline Phosphatase 90  39 - 117 (U/L)    Total Bilirubin 0.6  0.3 - 1.2 (mg/dL)    Bilirubin, Direct 0.1  0.0 - 0.3 (mg/dL)    Indirect Bilirubin 0.5  0.3 - 0.9 (mg/dL)   BASIC METABOLIC PANEL     Status: Abnormal   Collection Time   10/17/11  6:30 AM    Component Value Range Comment   Sodium 134 (*) 135 - 145 (mEq/L)    Potassium 3.4 (*) 3.5 - 5.1 (mEq/L) DELTA CHECK NOTED   Chloride 104  96 - 112 (mEq/L)    CO2 23  19 - 32 (mEq/L)    Glucose, Bld 115 (*) 70 - 99 (mg/dL)    BUN 11  6 - 23 (mg/dL)    Creatinine, Ser 7.82  0.50 - 1.10 (mg/dL)    Calcium 8.6  8.4 - 10.5 (mg/dL)    GFR calc non Af Amer >90  >90 (mL/min)    GFR calc Af Amer >90  >90 (mL/min)   CBC     Status: Abnormal   Collection Time   10/17/11  6:30 AM      Component Value Range Comment   WBC 11.0 (*) 4.0 - 10.5 (K/uL)    RBC 3.97  3.87 - 5.11 (MIL/uL)    Hemoglobin 12.8  12.0 - 15.0 (g/dL)    HCT 95.6  21.3 - 08.6 (%)    MCV 93.7  78.0 - 100.0 (fL)  MCH 32.2  26.0 - 34.0 (pg)    MCHC 34.4  30.0 - 36.0 (g/dL)    RDW 40.9  81.1 - 91.4 (%)    Platelets 126 (*) 150 - 400 (K/uL)   DIFFERENTIAL     Status: Abnormal   Collection Time   10/17/11  6:30 AM      Component Value Range Comment   Neutrophils Relative 87 (*) 43 - 77 (%)    Neutro Abs 9.6 (*) 1.7 - 7.7 (K/uL)    Lymphocytes Relative 8 (*) 12 - 46 (%)    Lymphs Abs 0.9  0.7 - 4.0 (K/uL)    Monocytes Relative 5  3 - 12 (%)    Monocytes Absolute 0.5  0.1 - 1.0 (K/uL)    Eosinophils Relative 1  0 - 5 (%)    Eosinophils Absolute 0.1  0.0 - 0.7 (K/uL)    Basophils Relative 0  0 - 1 (%)    Basophils Absolute 0.0  0.0 - 0.1 (K/uL)    Urine culture pending  Diagnostics:  Ct Abdomen Pelvis Wo Contrast  10/15/2011  *RADIOLOGY REPORT*  Clinical Data: Back pain and urinary retention  CT ABDOMEN AND PELVIS WITHOUT CONTRAST  Technique:  Multidetector CT imaging of the abdomen and pelvis was performed following the standard protocol without intravenous contrast.  Comparison: April 25, 2009  Findings: No hydronephrosis or renal calculus.  No definite ureteral calculus.  Diffuse hepatic steatosis with relative sparing adjacent to the gallbladder fossa.  Post cholecystectomy.  Spleen, pancreas, kidneys, adrenal glands are  within normal limits.  Stable large hiatal hernia.  Normal appendix.  Unremarkable bladder.  Adnexa are within normal limits. There is a thickened low density in the endometrial canal of the uterus towards the cervix of unknown significance.  No free fluid.  IMPRESSION: No evidence of urinary obstruction or urinary calculus.  Possible endometrial pathology in the lower uterus.  Ultrasound is recommended to further characterize.  Original Report Authenticated By: Donavan Burnet, M.D.   US Transvaginal Non-ob  10/16/2011  *RADIOLOGY REPORT*  Clinical Data: Possible endometrial pathology is suggested on previous CT  TRANSABDOMINAL AND TRANSVAGINAL ULTRASOUND OF PELVIS Technique:  Both transabdominal and transvaginal ultrasound examinations of the pelvis were performed. Transabdominal technique was performed for global imaging of the pelvis including uterus, ovaries, adnexal regions, and pelvic cul-de-sac.  Comparison: CT 10/15/2011   It was necessary to proceed with endovaginal exam following the transabdominal exam to visualize the endometrium to advantage.  Findings:  Uterus: 2.7 x 4.8 x 5.3 cm.  There is an 8 mm Nabothian cyst.  Endometrium: 4.2 mm in thickness, unremarkable  Right ovary:  Not visualized  Left ovary: Not visualized  Other findings: There is a trace amount of free pelvic fluid, probably physiologic.  IMPRESSION:  1.  8 mm Nabothian cyst which may account for the low density region seen on prior CT. 2.  No evidence of endometrial pathology. 3.  Ovaries not visualized.  Original Report Authenticated By: Osa Craver, M.D.   US Pelvis Complete  10/16/2011  *RADIOLOGY REPORT*  Clinical Data: Possible endometrial pathology is suggested on previous CT  TRANSABDOMINAL AND TRANSVAGINAL ULTRASOUND OF PELVIS Technique:  Both transabdominal and transvaginal ultrasound examinations of the pelvis were performed. Transabdominal technique was performed for global imaging of the pelvis including  uterus, ovaries, adnexal regions, and pelvic cul-de-sac.  Comparison: CT 10/15/2011   It was necessary to proceed with endovaginal exam following the transabdominal exam to  visualize the endometrium to advantage.  Findings:  Uterus: 2.7 x 4.8 x 5.3 cm.  There is an 8 mm Nabothian cyst.  Endometrium: 4.2 mm in thickness, unremarkable  Right ovary:  Not visualized  Left ovary: Not visualized  Other findings: There is a trace amount of free pelvic fluid, probably physiologic.  IMPRESSION:  1.  8 mm Nabothian cyst which may account for the low density region seen on prior CT. 2.  No evidence of endometrial pathology. 3.  Ovaries not visualized.  Original Report Authenticated By: Osa Craver, M.D.   Full Code   Hospital Course: See H&P for complete admission details. The patient is a pleasant 70 year old white female who presented to the emergency room with flank pain and chills. She had nausea vomiting. In the emergency room, she had a blood pressure of 104/50. Pulse 93. Temperature 90.9 degrees. She had dry mucous membranes. She had left CVA tenderness. Her abdomen was soft with mild lower abdominal tenderness. Her white blood cell count was 1600. With a mild left shift. Urinalysis was consistent with infection. Culture was ordered on admission but is still pending. CT of the abdomen and pelvis done in the emergency room showed no evidence of urinary obstruction or calculus. Possible endometrial pathology in the lower uterus. The patient was admitted and started on Cipro and IV fluids for pyelonephritis. She continued to have nausea. Her antibiotics were switched to ceftriaxone and her nausea resolved. I suspect her persistent nausea was somewhat related to the Cipro. At the time of discharge, she was euvolemic. Her back pain had resolved. She had no further nausea and she was tolerating a regular diet. Her platelet count, white blood cell count, and liver function tests were abnormal related to the  infection. These should normalize and have already started improving.  Discharge Exam:  Blood pressure 153/92, pulse 75, temperature 97.9 F (36.6 C), temperature source Oral, resp. rate 20, height 5\' 8"  (1.727 m), weight 84 kg (185 lb 3 oz), SpO2 93.00%.  General: Appears much more comfortable. HEENT moist mucous membranes Lungs clear to auscultation bilaterally without wheeze rhonchi or rales Cardiovascular regular rate rhythm without murmurs gallops rubs Abdomen soft nontender nondistended Extremities no clubbing cyanosis or edema   Signed: Issiah Huffaker L 10/18/2011, 12:45 PM

## 2011-10-19 LAB — URINE CULTURE: Culture  Setup Time: 201212230310

## 2011-11-15 ENCOUNTER — Other Ambulatory Visit (HOSPITAL_COMMUNITY): Payer: Self-pay | Admitting: Obstetrics and Gynecology

## 2011-11-15 DIAGNOSIS — Z139 Encounter for screening, unspecified: Secondary | ICD-10-CM

## 2011-11-18 ENCOUNTER — Ambulatory Visit (HOSPITAL_COMMUNITY)
Admission: RE | Admit: 2011-11-18 | Discharge: 2011-11-18 | Disposition: A | Discharge status: Home / Self Care | Payer: Medicare Other | Source: Ambulatory Visit | Attending: Obstetrics and Gynecology | Admitting: Obstetrics and Gynecology

## 2011-11-18 DIAGNOSIS — Z139 Encounter for screening, unspecified: Secondary | ICD-10-CM

## 2011-11-18 DIAGNOSIS — Z1231 Encounter for screening mammogram for malignant neoplasm of breast: Secondary | ICD-10-CM | POA: Insufficient documentation

## 2011-11-23 ENCOUNTER — Other Ambulatory Visit: Payer: Self-pay | Admitting: Obstetrics and Gynecology

## 2011-11-23 DIAGNOSIS — R928 Other abnormal and inconclusive findings on diagnostic imaging of breast: Secondary | ICD-10-CM

## 2011-12-08 ENCOUNTER — Ambulatory Visit (HOSPITAL_COMMUNITY)
Admission: RE | Admit: 2011-12-08 | Discharge: 2011-12-08 | Payer: Medicare Other | Source: Ambulatory Visit | Attending: Obstetrics and Gynecology | Admitting: Obstetrics and Gynecology

## 2011-12-15 ENCOUNTER — Ambulatory Visit (HOSPITAL_COMMUNITY)
Admission: RE | Admit: 2011-12-15 | Discharge: 2011-12-15 | Disposition: A | Discharge status: Home / Self Care | Payer: Medicare Other | Source: Ambulatory Visit | Attending: Obstetrics and Gynecology | Admitting: Obstetrics and Gynecology

## 2011-12-15 DIAGNOSIS — R928 Other abnormal and inconclusive findings on diagnostic imaging of breast: Secondary | ICD-10-CM | POA: Insufficient documentation

## 2012-02-01 ENCOUNTER — Other Ambulatory Visit: Payer: Self-pay | Admitting: Urology

## 2012-02-21 ENCOUNTER — Encounter (HOSPITAL_COMMUNITY): Payer: Self-pay | Admitting: Pharmacy Technician

## 2012-02-23 ENCOUNTER — Encounter (HOSPITAL_COMMUNITY): Payer: Self-pay

## 2012-02-23 ENCOUNTER — Encounter (HOSPITAL_COMMUNITY)
Admission: RE | Admit: 2012-02-23 | Discharge: 2012-02-23 | Disposition: A | Discharge status: Home / Self Care | Payer: Medicare Other | Source: Ambulatory Visit | Attending: Urology | Admitting: Urology

## 2012-02-23 DIAGNOSIS — R3129 Other microscopic hematuria: Secondary | ICD-10-CM

## 2012-02-23 HISTORY — DX: Other microscopic hematuria: R31.29

## 2012-02-23 HISTORY — DX: Personal history of urinary (tract) infections: Z87.440

## 2012-02-23 LAB — CBC
HCT: 38 % (ref 36.0–46.0)
MCHC: 33.7 g/dL (ref 30.0–36.0)
MCV: 93.6 fL (ref 78.0–100.0)
RDW: 12.9 % (ref 11.5–15.5)

## 2012-02-23 NOTE — Patient Instructions (Signed)
20 Karen Delacruz  02/23/2012   Your procedure is scheduled on:  03/01/12  Report to SHORT STAY DEPT  at 12:45 PM.  Call this number if you have problems the morning of surgery: [phone]   Remember:   Do not eat food or drink liquids AFTER MIDNIGHT  May have clear liquids UNTIL 6 HOURS BEFORE SURGERY (9:15 AM )  Clear liquids include soda, tea, black coffee, apple or grape juice, broth.  Take these medicines the morning of surgery with A SIP OF WATER: CYMBALTA   Do not wear jewelry, make-up or nail polish.  Do not wear lotions, powders, or perfumes.   Do not shave legs or underarms 48 hrs. before surgery (men may shave face)  Do not bring valuables to the hospital.  Contacts, dentures or bridgework may not be worn into surgery.  Leave suitcase in the car. After surgery it may be brought to your room.  For patients admitted to the hospital, checkout time is 11:00 AM the day of discharge.   Patients discharged the day of surgery will not be allowed to drive home.    Special Instructions:   Please read over the following fact sheets that you were given: MRSA  Information               SHOWER WITH BETASEPT THE NIGHT BEFORE SURGERY AND THE MORNING OF SURGERY

## 2012-02-24 NOTE — Pre-Procedure Instructions (Signed)
RECIEVED CALL FROM [facility] AT OFFICE CONCERNING PT'S ALLERGY TO PENICILLIN DR WOODRUFF WOULD LIKE CIPRO 400 MG IV PREOP AND TO DISCONTINUED ORDER FOR ANCEF.

## 2012-03-01 ENCOUNTER — Encounter (HOSPITAL_COMMUNITY): Payer: Self-pay | Admitting: *Deleted

## 2012-03-01 ENCOUNTER — Ambulatory Visit (HOSPITAL_COMMUNITY)
Admission: RE | Admit: 2012-03-01 | Discharge: 2012-03-01 | Disposition: A | Discharge status: Home / Self Care | Payer: Medicare Other | Source: Ambulatory Visit | Attending: Urology | Admitting: Urology

## 2012-03-01 ENCOUNTER — Ambulatory Visit (HOSPITAL_COMMUNITY): Payer: Medicare Other | Admitting: *Deleted

## 2012-03-01 ENCOUNTER — Encounter (HOSPITAL_COMMUNITY)
Admission: RE | Disposition: A | Discharge status: Home / Self Care | Payer: Self-pay | Source: Ambulatory Visit | Attending: Urology

## 2012-03-01 DIAGNOSIS — Z01812 Encounter for preprocedural laboratory examination: Secondary | ICD-10-CM | POA: Insufficient documentation

## 2012-03-01 DIAGNOSIS — N329 Bladder disorder, unspecified: Secondary | ICD-10-CM | POA: Insufficient documentation

## 2012-03-01 DIAGNOSIS — R300 Dysuria: Secondary | ICD-10-CM | POA: Insufficient documentation

## 2012-03-01 DIAGNOSIS — IMO0001 Reserved for inherently not codable concepts without codable children: Secondary | ICD-10-CM | POA: Insufficient documentation

## 2012-03-01 SURGERY — CYSTOURETEROSCOPY, WITH RETROGRADE PYELOGRAM AND STENT INSERTION
Anesthesia: General | Site: Ureter | Laterality: Left | Wound class: Clean Contaminated

## 2012-03-01 MED ORDER — BELLADONNA ALKALOIDS-OPIUM 16.2-60 MG RE SUPP
RECTAL | Status: AC
  Filled 2012-03-01: qty 1

## 2012-03-01 MED ORDER — LIDOCAINE HCL 2 % EX GEL
CUTANEOUS | Status: AC
  Filled 2012-03-01: qty 10

## 2012-03-01 MED ORDER — OXYCHLOROSENE SODIUM POWD
Status: AC
  Filled 2012-03-01: qty 2

## 2012-03-01 MED ORDER — FENTANYL CITRATE 0.05 MG/ML IJ SOLN
INTRAMUSCULAR | Status: DC | PRN
  Administered 2012-03-01: 25 ug via INTRAVENOUS
  Administered 2012-03-01: 50 ug via INTRAVENOUS

## 2012-03-01 MED ORDER — HYDROCODONE-ACETAMINOPHEN 5-325 MG PO TABS: 1.0000 | ORAL_TABLET | ORAL | Status: AC | PRN

## 2012-03-01 MED ORDER — LIDOCAINE HCL 2 % EX GEL
CUTANEOUS | Status: DC | PRN
  Administered 2012-03-01: 1 via URETHRAL

## 2012-03-01 MED ORDER — INDIGOTINDISULFONATE SODIUM 8 MG/ML IJ SOLN
INTRAMUSCULAR | Status: AC
  Filled 2012-03-01: qty 5

## 2012-03-01 MED ORDER — FENTANYL CITRATE 0.05 MG/ML IJ SOLN: 25.0000 ug | INTRAMUSCULAR | Status: DC | PRN

## 2012-03-01 MED ORDER — PROPOFOL 10 MG/ML IV EMUL
INTRAVENOUS | Status: DC | PRN
  Administered 2012-03-01: 150 mg via INTRAVENOUS

## 2012-03-01 MED ORDER — IOHEXOL 300 MG/ML  SOLN
INTRAMUSCULAR | Status: AC
  Filled 2012-03-01: qty 1

## 2012-03-01 MED ORDER — LACTATED RINGERS IV SOLN
INTRAVENOUS | Status: DC | PRN
  Administered 2012-03-01: 18:00:00 via INTRAVENOUS

## 2012-03-01 MED ORDER — HYOSCYAMINE SULFATE 0.125 MG PO TABS: 0.1250 mg | ORAL_TABLET | ORAL | Status: DC | PRN

## 2012-03-01 MED ORDER — LACTATED RINGERS IV SOLN: INTRAVENOUS | Status: DC

## 2012-03-01 MED ORDER — PROMETHAZINE HCL 25 MG/ML IJ SOLN: 6.2500 mg | INTRAMUSCULAR | Status: DC | PRN

## 2012-03-01 MED ORDER — CIPROFLOXACIN IN D5W 400 MG/200ML IV SOLN
400.0000 mg | Freq: Two times a day (BID) | INTRAVENOUS | Status: DC
  Administered 2012-03-01: 400 mg via INTRAVENOUS

## 2012-03-01 MED ORDER — PHENAZOPYRIDINE HCL 100 MG PO TABS: 100.0000 mg | ORAL_TABLET | Freq: Three times a day (TID) | ORAL | Status: AC | PRN

## 2012-03-01 MED ORDER — IOHEXOL 300 MG/ML  SOLN
INTRAMUSCULAR | Status: DC | PRN
  Administered 2012-03-01: 20 mL via INTRAVENOUS

## 2012-03-01 MED ORDER — CIPROFLOXACIN IN D5W 400 MG/200ML IV SOLN
INTRAVENOUS | Status: AC
  Filled 2012-03-01: qty 200

## 2012-03-01 MED ORDER — SENNOSIDES-DOCUSATE SODIUM 8.6-50 MG PO TABS: 1.0000 | ORAL_TABLET | Freq: Two times a day (BID) | ORAL | Status: DC

## 2012-03-01 MED ORDER — LIDOCAINE HCL 1 % IJ SOLN
INTRAMUSCULAR | Status: DC | PRN
  Administered 2012-03-01: 50 mg via INTRADERMAL

## 2012-03-01 MED ORDER — SODIUM CHLORIDE 0.9 % IR SOLN
Status: DC | PRN
  Administered 2012-03-01: 1000 mL

## 2012-03-01 MED ORDER — ONDANSETRON HCL 4 MG/2ML IJ SOLN
INTRAMUSCULAR | Status: DC | PRN
  Administered 2012-03-01: 4 mg via INTRAVENOUS

## 2012-03-01 MED ORDER — BELLADONNA ALKALOIDS-OPIUM 16.2-60 MG RE SUPP
RECTAL | Status: DC | PRN
  Administered 2012-03-01: 1 via RECTAL

## 2012-03-01 SURGICAL SUPPLY — 27 items
BAG URINE DRAINAGE (UROLOGICAL SUPPLIES) ×2 IMPLANT
BAG URO CATCHER STRL LF (DRAPE) ×3 IMPLANT
CATH FOLEY 2WAY SLVR  5CC 18FR (CATHETERS) ×1
CATH FOLEY 2WAY SLVR 5CC 18FR (CATHETERS) ×1 IMPLANT
CATH ROBINSON RED A/P 16FR (CATHETERS) IMPLANT
CATH URET 5FR 28IN OPEN ENDED (CATHETERS) ×2 IMPLANT
CLOTH BEACON ORANGE TIMEOUT ST (SAFETY) ×3 IMPLANT
DRAPE CAMERA CLOSED 9X96 (DRAPES) ×3 IMPLANT
ELECT REM PT RETURN 9FT ADLT (ELECTROSURGICAL) ×3
ELECTRODE REM PT RTRN 9FT ADLT (ELECTROSURGICAL) ×2 IMPLANT
GLOVE BIOGEL PI IND STRL 7.5 (GLOVE) ×2 IMPLANT
GLOVE BIOGEL PI INDICATOR 7.5 (GLOVE) ×1
GLOVE ECLIPSE 7.5 STRL STRAW (GLOVE) ×3 IMPLANT
GOWN PREVENTION PLUS XLARGE (GOWN DISPOSABLE) ×6 IMPLANT
GOWN STRL NON-REIN LRG LVL3 (GOWN DISPOSABLE) ×3 IMPLANT
GUIDEWIRE ANG ZIPWIRE 038X150 (WIRE) ×2 IMPLANT
GUIDEWIRE STR DUAL SENSOR (WIRE) ×4 IMPLANT
IV NS 1000ML (IV SOLUTION) ×3
IV NS 1000ML BAXH (IV SOLUTION) ×1 IMPLANT
MANIFOLD NEPTUNE II (INSTRUMENTS) ×3 IMPLANT
NDL SAFETY ECLIPSE 18X1.5 (NEEDLE) IMPLANT
NEEDLE HYPO 18GX1.5 SHARP (NEEDLE)
NEEDLE HYPO 22GX1.5 SAFETY (NEEDLE) IMPLANT
PACK CYSTO (CUSTOM PROCEDURE TRAY) ×3 IMPLANT
STENT CONTOUR 6FRX26X.038 (STENTS) ×2 IMPLANT
TUBING CONNECTING 10 (TUBING) ×3 IMPLANT
WATER STERILE IRR 3000ML UROMA (IV SOLUTION) ×3 IMPLANT

## 2012-03-01 NOTE — Anesthesia Preprocedure Evaluation (Signed)
Anesthesia Evaluation  Patient identified by MRN, date of birth, ID band Patient awake    Reviewed: Allergy & Precautions, H&P , NPO status , Patient's Chart, lab work & pertinent test results  History of Anesthesia Complications Negative for: history of anesthetic complications  Airway Mallampati: II TM Distance: >3 FB Neck ROM: Full    Dental  (+) Edentulous Upper and Edentulous Lower   Pulmonary neg pulmonary ROS,  breath sounds clear to auscultation  Pulmonary exam normal       Cardiovascular negative cardio ROS  Rhythm:Regular Rate:Normal     Neuro/Psych negative neurological ROS  negative psych ROS   GI/Hepatic negative GI ROS, Neg liver ROS,   Endo/Other  negative endocrine ROS  Renal/GU negative Renal ROS  negative genitourinary   Musculoskeletal negative musculoskeletal ROS (+) Fibromyalgia -  Abdominal   Peds negative pediatric ROS (+)  Hematology negative hematology ROS (+)   Anesthesia Other Findings   Reproductive/Obstetrics negative OB ROS                           Anesthesia Physical Anesthesia Plan  ASA: II  Anesthesia Plan: General   Post-op Pain Management:    Induction: Intravenous  Airway Management Planned: LMA  Additional Equipment:   Intra-op Plan:   Post-operative Plan: Extubation in OR  Informed Consent: I have reviewed the patients History and Physical, chart, labs and discussed the procedure including the risks, benefits and alternatives for the proposed anesthesia with the patient or authorized representative who has indicated his/her understanding and acceptance.   Dental advisory given  Plan Discussed with: CRNA  Anesthesia Plan Comments:         Anesthesia Quick Evaluation

## 2012-03-01 NOTE — H&P (Signed)
Urology History and Physical Exam  CC: Bladder lesion  HPI: 71 year old female presented with dysuria. This was associated with UTI and gross. CT abd/pelvis non-contrast 10/15/11 which showed no urolithiasis, hydronephrosis, or other signs of urinary obstruction. She has a history of second hand smoke exposure for 50 years. Due to her risk factors and gross hematuria she had a cystoscopy which revealed a velvety red patch on her left bladder wall and bladder dome consistent with CIS. She presents today for cystoscopy, bladder biopsy, and bilateral retrograde pyelograms. We have discussed the risks, benefits, alternatives, and likelihood of achieving goals. She had an E. coli UTI 01/28/12 which has been treated and is sensitive to ancef.   PMH: Past Medical History  Diagnosis Date  . Fibromyalgia 10/15/2011  . Thickened endometrium 10/16/2011  . Hx: UTI (urinary tract infection)     recurrent    PSH: Past Surgical History  Procedure Date  . Back surgery   . Cholecystectomy     Allergies: Allergies  Allergen Reactions  . Ciprofloxacin Nausea And Vomiting  . Codeine Other (See Comments)    Feels funny  . Penicillins Rash  . Sulfonamide Derivatives Rash    Medications: No prescriptions prior to admission     Social History: History   Social History  . Marital Status: Married    Spouse Name: N/A    Number of Children: N/A  . Years of Education: N/A   Occupational History  . Not on file.   Social History Main Topics  . Smoking status: Never Smoker   . Smokeless tobacco: Never Used  . Alcohol Use: No  . Drug Use: No  . Sexually Active:    Other Topics Concern  . Not on file   Social History Narrative  . No narrative on file    Family History: Family History  Problem Relation Age of Onset  . Cancer Mother     Review of Systems: Positive: None. Negative: Fever, chest pain, SOB.  A further 10 point review of systems was negative except what is listed in  the HPI.  Physical Exam:  General: No acute distress.  Awake. Head:  Normocephalic.  Atraumatic. ENT:  EOMI.  Mucous membranes moist Neck:  Supple.  No lymphadenopathy. CV:  S1 present. S2 present. Regular rate. Pulmonary: Equal effort bilaterally.  Clear to auscultation bilaterally. Abdomen: Soft.  Non- tender to palpation. Skin:  Normal turgor.  No visible rash. Extremity: No gross deformity of bilateral upper extremities.  No gross deformity of    bilateral lower extremities. Neurologic: Alert. Appropriate mood.   Studies:  No results found for this basename: HGB:2,WBC:2,PLT:2 in the last 72 hours  No results found for this basename: NA:2,K:2,CL:2,CO2:2,BUN:2,CREATININE:2,CALCIUM:2,MAGNESIUM:2,GFRNONAA:2,GFRAA:2 in the last 72 hours   No results found for this basename: PT:2,INR:2,APTT:2 in the last 72 hours   No components found with this basename: ABG:2    Assessment:  Bladder lesion  Plan: -To OR for cystoscopy, bladder biopsy, bilateral retrograde pyelogram.

## 2012-03-01 NOTE — Progress Notes (Signed)
Pt and family informed that surgery delayed. They verbalize understanding. Emotional support given.

## 2012-03-01 NOTE — Brief Op Note (Signed)
03/01/2012  7:24 PM  PATIENT:  Karen Delacruz  71 y.o. female  PRE-OPERATIVE DIAGNOSIS:  Bladder Lesion  POST-OPERATIVE DIAGNOSIS:  Bladder lesion.   PROCEDURE:  Procedure(s) (LRB): CYSTOSCOPY  Bilateral RETROGRADE PYELOGRAM Left URETEROSCOPY  Left ureter STENT PLACEMENT (Left)  SURGEON:  Surgeon(s) and Role:    * Milford Cage, MD - Primary  PHYSICIAN ASSISTANT:   ASSISTANTS: none   ANESTHESIA:   general  EBL:     BLOOD ADMINISTERED:none  DRAINS: Urinary Catheter (Foley)   LOCAL MEDICATIONS USED:  LIDOCAINE  and Amount: 10 ml urethral jelly.  SPECIMEN:  Source of Specimen:  bladder.  DISPOSITION OF SPECIMEN:  PATHOLOGY  COUNTS:  YES  TOURNIQUET:  * No tourniquets in log *  DICTATION: .Other Dictation: Dictation Number [id number]  PLAN OF CARE: Discharge to home after PACU  PATIENT DISPOSITION:  PACU - hemodynamically stable.   Delay start of Pharmacological VTE agent (>24hrs) due to surgical blood loss or risk of bleeding: no

## 2012-03-01 NOTE — Transfer of Care (Signed)
Immediate Anesthesia Transfer of Care Note  Patient: Karen Delacruz  Procedure(s) Performed: Procedure(s) (LRB): CYSTOSCOPY WITH RETROGRADE PYELOGRAM, URETEROSCOPY AND STENT PLACEMENT (Left)  Patient Location: PACU  Anesthesia Type: General  Level of Consciousness: awake and alert   Airway & Oxygen Therapy: Patient Spontanous Breathing and Patient connected to face mask oxygen  Post-op Assessment: Report given to PACU RN and Post -op Vital signs reviewed and stable  Post vital signs: Reviewed and stable  Complications: No apparent anesthesia complications

## 2012-03-01 NOTE — Anesthesia Postprocedure Evaluation (Signed)
Anesthesia Post Note  Patient: Karen Delacruz  Procedure(s) Performed: Procedure(s) (LRB): CYSTOSCOPY WITH RETROGRADE PYELOGRAM, URETEROSCOPY AND STENT PLACEMENT (Left)  Anesthesia type: General  Patient location: PACU  Post pain: Pain level controlled  Post assessment: Post-op Vital signs reviewed  Last Vitals:  Filed Vitals:   03/01/12 1915  BP: 140/78  Pulse: 78  Temp: 36.4 C  Resp: 15    Post vital signs: Reviewed  Level of consciousness: sedated  Complications: No apparent anesthesia complications

## 2012-03-01 NOTE — Discharge Instructions (Signed)
DISCHARGE INSTRUCTIONS FOR URETERAL STENT  MEDICATIONS:   1. Resume all your other meds from home - except do not take any other pain meds that you may have at home.  ACTIVITY 1. No strenuous activity x 1week 2. No driving while on narcotic pain medications 3. Drink plenty of water 4. Continue to walk at home - you can still get blood clots when you are at home, so keep active, but don't over do it. 5. May return to work in 3 days.  BATHING 1. You can shower.   SIGNS/SYMPTOMS TO CALL: 1. Please call us if you have a fever greater than 101.5, uncontrolled  nausea/vomiting, uncontrolled pain, dizziness, unable to urinate, chest pain, shortness of breath, leg swelling, leg pain, redness around wound, drainage from wound, or any other concerns or questions.  You can reach [place] at [phone].  FOLLOW-UP 1. You will have your catheter removed in 1 week with Ashlyn.  You will have your stent removed in 2 weeks with Dr. Margarita Grizzle.

## 2012-03-02 NOTE — Op Note (Signed)
NAME:  Dowis, Sherilee                ACCOUNT NO.:  [id number]  MEDICAL RECORD NO.:  [id number]  LOCATION:  [facility]                         FACILITY:  [facility]  PHYSICIAN:  Natalia Leatherwood, MD    DATE OF BIRTH:  10/17/1941  DATE OF PROCEDURE:  03/01/2012 DATE OF DISCHARGE:  03/01/2012                              OPERATIVE REPORT   SURGEON:  Natalia Leatherwood, MD  ASSISTANT:  None.  PREOPERATIVE DIAGNOSIS:  Bladder lesion.  POSTOPERATIVE DIAGNOSIS:  Bladder lesion.  PROCEDURES: 1. Cystoscopy, panendoscopy. 2. Bilateral retrograde pyelogram. 3. Left ureteroscopy. 4. Left ureteral stent placement. 5. Fluoroscopy less than 1 hour with interpretation.  FINDINGS:  Erythematous area on the left posterior bladder dome, sent for biopsy.  There are no filling defects in the right collecting system or ureteral system.  In the left side, there was some extravasation with a retrograde pyelogram initially, which was concerning for possible ureteral obstruction.  Therefore ureteroscopy was performed; however, this showed no ureteral obstruction or tumor.  COMPLICATIONS:  None.  HISTORY OF PRESENT ILLNESS:  This is a 71 year old female who presented with lower urinary tract symptoms, had microscopic hematuria.  Because of a history of smoke exposure, she underwent microscopic hematuria workup.  Cystoscopy in the office revealed a bladder lesion and therefore she elected to proceed for bladder biopsy and bilateral retrograde pyelogram.  She presents today for that procedure.  PROCEDURE IN DETAIL:  Informed was obtained.  The patient was taken to the operating room.  She was placed in supine position.  IV antibiotics were infused and general anesthesia was induced.  She was then placed in dorsal lithotomy position making sure to pad all pertinent neurovascular pressure points appropriately.  Following this, a time-out was performed by which the correct patient, surgical site, and procedure were  all identified and agreed upon by the team.  Her genitals were prepped and draped in usual sterile fashion.  A 12-degree cystoscope was advanced through the urethra into the bladder.  The bladder was evaluated as well as the entirety of the internal portion of the bladder was distended and evaluated with a 12-degree and 70-degree lens.  The entirety of the entire of the bladder was visualized.  There was an erythematous area in the left posterior bladder dome.  Next attention turned left ureter.  It was cannulated with a 5-French open-ended ureteral catheter.  It was placed with ease and a retrograde pyelogram was obtained, which showed possible filling defect with obstruction.  Therefore a angled-tip Glidewire was placed through the ureteral catheter up into the renal pelvis.  The 5-French catheter was then placed past this area of possible obstruction up into the mid ureter and retrograde pyelogram was obtained, and pulled back down and there was no filling defect in the left renal pelvis, collecting system, or ureter.  The Glidewire was exchanged for sensor-tip wire and this was secured to the drape and a semi-rigid ureteroscope was placed into the left distal ureter.  It was noted that there was a small false passage, and this accounted for the unusual retrograde pyelogram. There was no tumor noted and the ureter was healthy other than this one small area.  It was felt that a ureteral stent needed to be left in place and therefore, the semirigid ureteroscope was removed and a 6 x 26 double-J ureteral stent was placed without the strings in place up over the wire with a good curl noted in the left renal pelvis and a curl in the bladder.  Attention was then turned to right ureteral orifice.  It was cannulated with a sensor tip wire and a 5-French open-ended ureteral catheter was placed and retrograde pyelogram was obtained when I injected contrast. There was no filling defects in the  collecting system or ureter on this right side.  Next, a cold-cup biopsy forceps were used to biopsy the area on the posterior bladder dome and the area was fulgurated with Bugbee electrode as well as the mucosa around this area.  Due to the thinness of her bladder in this area, it was felt that a Foley catheter should be placed.  There was no fat visible, but her bladder was very thin. Therefore 10 mL of lidocaine jelly was placed into her urethra.  An 11- French catheter was placed with 10 mL sterile water in the balloon, and a B&O suppository was placed into the rectum. This completed the procedure.  She has placed back in supine position.  Anesthesia was reversed.  She was taken to PACU in stable condition.  She will go home with a bladder catheter in place.  This will be removed in 1 week and her ureteral stent to be removed in 2 weeks.          ______________________________ Natalia Leatherwood, MD     DW/MEDQ  D:  03/01/2012  T:  03/02/2012  Job:  [id number]

## 2012-07-28 ENCOUNTER — Ambulatory Visit: Payer: Medicare Other | Admitting: Family Medicine

## 2012-07-31 ENCOUNTER — Encounter: Payer: Self-pay | Admitting: Family Medicine

## 2012-07-31 ENCOUNTER — Ambulatory Visit (INDEPENDENT_AMBULATORY_CARE_PROVIDER_SITE_OTHER): Payer: Medicare Other | Admitting: Family Medicine

## 2012-07-31 VITALS — BP 130/78 | HR 73 | Temp 98.5°F | Ht 68.0 in | Wt 167.0 lb

## 2012-07-31 DIAGNOSIS — M542 Cervicalgia: Secondary | ICD-10-CM

## 2012-07-31 DIAGNOSIS — IMO0001 Reserved for inherently not codable concepts without codable children: Secondary | ICD-10-CM

## 2012-07-31 DIAGNOSIS — M791 Myalgia, unspecified site: Secondary | ICD-10-CM

## 2012-07-31 DIAGNOSIS — Z23 Encounter for immunization: Secondary | ICD-10-CM

## 2012-07-31 MED ORDER — METHYLPREDNISOLONE ACETATE 40 MG/ML IJ SUSP
20.0000 mg | Freq: Once | INTRAMUSCULAR | Status: AC
  Administered 2012-07-31: 20 mg via INTRALESIONAL

## 2012-07-31 NOTE — Progress Notes (Signed)
Office Note 07/31/2012  CC:  Chief Complaint  Patient presents with  . Establish Care    HPI:  Karen Delacruz is a 71 y.o. White female who is here to establish care. Patient's most recent primary MD: [facility] in [place], [place]. GYN: [facility].   Old records in EPIC/[facility] EMR were reviewed prior to or during today's visit.  Fibromyalgia pains usually symmetric back of neck/shoulders, arms.  Last few days she's had some isolated right side of neck to trapezius area pain that has persisted, 6-7/10, OTC pain meds help some.      Past Medical History  Diagnosis Date  . Fibromyalgia   . Thickened endometrium 10/16/2011    on CT.  F/u pelvic u/s showed nabothian cyst to account for this..  No enometrial pathology noted on u/s, no ovaries visualized.  Marland Kitchen Hx: UTI (urinary tract infection)     recurrent.  Pyelo hospitalization 2012  . GERD (gastroesophageal reflux disease)   . Microhematuria 02/2012    Cystoscopy with bladder biopsy and cystouretograms--pathology benign.  . Hypertension   . Vertigo   Chronic left lower extremity numbness and swelling: u/s showed no clot.  Right side with much milder sx's.  (? Chronic LE venous insuff).  Procedure (?) offered but she declined for now (eval was done 2011).  Past Surgical History  Procedure Date  . Back surgery 1990s x 2    Dr. Jeannine Kitten in [place]  . Cholecystectomy   . Hysteroscopy 11/2004    Cervical hystogram   . Colonoscopy approx 2002    Normal per pt report ([facility])    Family History  Problem Relation Age of Onset  . Cancer Mother     bladder    History   Social History  . Marital Status: Married    Spouse Name: N/A    Number of Children: N/A  . Years of Education: N/A   Occupational History  . Not on file.   Social History Main Topics  . Smoking status: Never Smoker   . Smokeless tobacco: Never Used  . Alcohol Use: No  . Drug Use: No  . Sexually Active:     Other Topics Concern  . Not on file   Social History Narrative   Married, lives with husband in [place].  Has two grown children.Retired from [employer], then un-retired to go back to same job--full time.No t/a/d.No formal exercise.Normal american diet.    MEDS: ASA 81mg , Venlafaxine 150mg  ER qd  Allergies  Allergen Reactions  . Ciprofloxacin Nausea And Vomiting  . Codeine Other (See Comments)    Feels funny  . Celebrex (Celecoxib) Rash  . Penicillins Rash    ROS Review of Systems  Constitutional: Negative for fever.  HENT: Positive for neck pain (see hpi).   Respiratory: Negative for cough and wheezing.   Cardiovascular: Negative for leg swelling.  Gastrointestinal: Negative for abdominal pain.  Genitourinary: Negative for dysuria, urgency and flank pain.  Musculoskeletal: Negative for joint swelling.  Skin: Negative for rash.  Neurological: Negative for dizziness, tremors, weakness, numbness and headaches.  Psychiatric/Behavioral: Negative for confusion.    PE; Blood pressure 130/78, pulse 73, temperature 98.5 F (36.9 C), temperature source Temporal, height 5\' 8"  (1.727 m), weight 167 lb (75.751 kg), SpO2 96.00%. Gen: Alert, well appearing.  Patient is oriented to person, place, time, and situation. ENT:  Eyes: no injection, icteris, swelling, or exudate.  EOMI, PERRLA. Nose: no drainage or turbinate edema/swelling.  No injection or focal lesion.  Mouth: lips without lesion/swelling.  Oral mucosa pink and moist.    Oropharynx without erythema, exudate, or swelling.  Neck: right posterior neck soft tissue TTP lateral to facet joints, with tenderness extending down into trapezius region on right/rhomboids region on right.  +Trigger point noted.  ROM of neck limited by pain, esp in ext/rotation/lateral bending. Spurling's negative for radicular pain or paresthesias. CV: RRR, no m/r/g.   LUNGS: CTA bilat, nonlabored resps, good aeration in all lung fields. ABD:  soft, NT, ND, BS normal.  No hepatospenomegaly or mass.  No bruits. EXT: 1+ pitting edema bilat, with multiple varicosities bilat, no erythema.  Pertinent labs:  None today  ASSESSMENT AND PLAN:   Transfer pt from [facility]: obtain old records.  Myalgia I'm skeptical of her dx of fibromyalgia if in fact she did not develop her symptoms until her 39s. Will continue venlafaxine as it seems to be helping, also some prn doses of NSAIDs or Tylenol which seems to be working adequately.   Trigger point with neck pain Injected this trigger point in right superior trapezius region today after reviewing benefits and risks.   Depo-medrol 20mg  + 1/2 ml of 2% lidocaine injected into muscle/soft tissue trigger point today without problem.  No immediate complications, pt tolerated procedure well.  Discussed heat application tonight.  ROM exercises of neck and shoulders discussed.   Tdap and flu vaccine given IM today.  An After Visit Summary was printed and given to the patient.  Return for f/u in office for morning appt fasting o/v in 2-3 wks--recheck muscle pain.

## 2012-07-31 NOTE — Assessment & Plan Note (Signed)
I'm skeptical of her dx of fibromyalgia if in fact she did not develop her symptoms until her 66s. Will continue venlafaxine as it seems to be helping, also some prn doses of NSAIDs or Tylenol which seems to be working adequately.

## 2012-07-31 NOTE — Assessment & Plan Note (Signed)
Injected this trigger point in right superior trapezius region today after reviewing benefits and risks.   Depo-medrol 20mg  + 1/2 ml of 2% lidocaine injected into muscle/soft tissue trigger point today without problem.  No immediate complications, pt tolerated procedure well.  Discussed heat application tonight.  ROM exercises of neck and shoulders discussed.

## 2012-08-17 ENCOUNTER — Ambulatory Visit (HOSPITAL_COMMUNITY)
Admission: RE | Admit: 2012-08-17 | Discharge: 2012-08-17 | Disposition: A | Discharge status: Home / Self Care | Payer: Medicare Other | Source: Ambulatory Visit | Attending: Family Medicine | Admitting: Family Medicine

## 2012-08-17 ENCOUNTER — Encounter: Payer: Self-pay | Admitting: Family Medicine

## 2012-08-17 ENCOUNTER — Ambulatory Visit (INDEPENDENT_AMBULATORY_CARE_PROVIDER_SITE_OTHER): Payer: Medicare Other | Admitting: Family Medicine

## 2012-08-17 VITALS — BP 120/78 | HR 75 | Ht 68.0 in | Wt 167.0 lb

## 2012-08-17 DIAGNOSIS — M79609 Pain in unspecified limb: Secondary | ICD-10-CM

## 2012-08-17 DIAGNOSIS — E785 Hyperlipidemia, unspecified: Secondary | ICD-10-CM

## 2012-08-17 DIAGNOSIS — M25569 Pain in unspecified knee: Secondary | ICD-10-CM

## 2012-08-17 DIAGNOSIS — M542 Cervicalgia: Secondary | ICD-10-CM

## 2012-08-17 DIAGNOSIS — R5383 Other fatigue: Secondary | ICD-10-CM

## 2012-08-17 DIAGNOSIS — M25561 Pain in right knee: Secondary | ICD-10-CM | POA: Insufficient documentation

## 2012-08-17 DIAGNOSIS — Z0000 Encounter for general adult medical examination without abnormal findings: Secondary | ICD-10-CM

## 2012-08-17 LAB — CBC WITH DIFFERENTIAL/PLATELET
Basophils Absolute: 0.1 10*3/uL (ref 0.0–0.1)
Lymphocytes Relative: 31.8 % (ref 12.0–46.0)
Monocytes Relative: 10.2 % (ref 3.0–12.0)
Neutrophils Relative %: 49.6 % (ref 43.0–77.0)
Platelets: 234 10*3/uL (ref 150.0–400.0)
RDW: 13.2 % (ref 11.5–14.6)

## 2012-08-17 NOTE — Assessment & Plan Note (Signed)
Lots of varicosities in the region of tenderness, which is likely the reason for the pain/tenderness. However, will check venous doppler to r/o DVT.

## 2012-08-17 NOTE — Assessment & Plan Note (Signed)
Resolved

## 2012-08-17 NOTE — Progress Notes (Signed)
OFFICE NOTE  08/17/2012  CC:  Chief Complaint  Patient presents with  . Follow-up    shoulder pain, improved     HPI: Patient is a 71 y.o. Caucasian female who is here for 2 week f/u of right lateral neck/trapezius/rhomboid muscle pain with trigger point--injection of depo medrol was done in the trigger point.  She says the injection made the pain go completely away after a few hours.   About 1-2 mo of right knee pain, posterolateral area is where she points.  She notes no swelling.  Constantly present, worse when she tries to get up after prolonged sitting.  Feels like it is going to give way when she walkes some, wears a knee sleave and this helps a little.     Pertinent PMH:  Past Medical History  Diagnosis Date  . Fibromyalgia   . Thickened endometrium 10/16/2011    on CT.  F/u pelvic u/s showed nabothian cyst to account for this..  No enometrial pathology noted on u/s, no ovaries visualized.  Marland Kitchen Hx: UTI (urinary tract infection)     recurrent.  Pyelo hospitalization 2012  . GERD (gastroesophageal reflux disease)   . Microhematuria 02/2012    Cystoscopy with bladder biopsy and cystouretograms--pathology benign.  . Hypertension   . Vertigo     MEDS:  Outpatient Prescriptions Prior to Visit  Medication Sig Dispense Refill  . aspirin EC 81 MG tablet Take 81 mg by mouth daily.      . Multiple Vitamin (MULTIVITAMIN) tablet Take 1 tablet by mouth daily.      . Venlafaxine HCl 150 MG TB24 Take 1 tablet by mouth daily.        PE: Blood pressure 120/78, pulse 75, height 5\' 8"  (1.727 m), weight 167 lb (75.751 kg). Gen: Alert, well appearing.  Patient is oriented to person, place, time, and situation. Right side of neck and right trapezius area is nontender. Right knee: no erythema or swelling or warmth.  Flexion and extenson without pain. Focal TTP in soft tissues of posterolateral portion of knee.  No joint line tenderness.    IMPRESSION AND PLAN:  Right knee pain Lots of  varicosities in the region of tenderness, which is likely the reason for the pain/tenderness. However, will check venous doppler to r/o DVT.  Trigger point with neck pain Resolved.   FOLLOW UP: 4 wks

## 2012-08-18 ENCOUNTER — Other Ambulatory Visit: Payer: Medicare Other

## 2012-08-18 LAB — BASIC METABOLIC PANEL
CO2: 30 mEq/L (ref 19–32)
Calcium: 9.1 mg/dL (ref 8.4–10.5)
Glucose, Bld: 86 mg/dL (ref 70–99)
Sodium: 137 mEq/L (ref 135–145)

## 2012-08-18 LAB — LIPID PANEL
HDL: 60.9 mg/dL (ref >39.00)
Triglycerides: 123 mg/dL (ref 0.0–149.0)

## 2012-08-18 LAB — TSH: TSH: 2.08 u[IU]/mL (ref 0.35–5.50)

## 2012-09-04 ENCOUNTER — Ambulatory Visit (INDEPENDENT_AMBULATORY_CARE_PROVIDER_SITE_OTHER): Payer: Medicare Other | Admitting: Family Medicine

## 2012-09-04 ENCOUNTER — Encounter: Payer: Self-pay | Admitting: Family Medicine

## 2012-09-04 VITALS — BP 133/79 | HR 66 | Ht 68.0 in | Wt 168.0 lb

## 2012-09-04 DIAGNOSIS — I8390 Asymptomatic varicose veins of unspecified lower extremity: Secondary | ICD-10-CM

## 2012-09-04 NOTE — Assessment & Plan Note (Signed)
Focal inflammation symptoms now resolved. Reassured.

## 2012-09-04 NOTE — Progress Notes (Signed)
OFFICE NOTE  09/04/2012  CC:  Chief Complaint  Patient presents with  . Follow-up    leg pain     HPI: Patient is a 71 y.o. Caucasian female who is here for 2+ wk f/u of right lower leg pain. Last visit we did a u/s to r/o DVT and this was negative.  She does have extensive varicosities in both LE's, and some of these were painful and tender in right popliteal fossa region when I saw her last--no skin changes assoc with these.   She reports that one day recently her sx's spontaneously resolved.  She can now walk/bend leg without pain.  Pertinent PMH:  Past Medical History  Diagnosis Date  . Myalgia     Hx of diffuse myalgias, onset in her 55s  . Thickened endometrium 10/16/2011    on CT.  F/u pelvic u/s showed nabothian cyst to account for this..  No enometrial pathology noted on u/s, no ovaries visualized.  Marland Kitchen Hx: UTI (urinary tract infection)     recurrent.  Pyelo hospitalization 2012  . GERD (gastroesophageal reflux disease)   . Microhematuria 02/2012    Cystoscopy with bladder biopsy and cystouretograms--pathology benign.  . Hypertension   . Vertigo     MEDS:  Outpatient Prescriptions Prior to Visit  Medication Sig Dispense Refill  . aspirin EC 81 MG tablet Take 81 mg by mouth daily.      . Multiple Vitamin (MULTIVITAMIN) tablet Take 1 tablet by mouth daily.      . Venlafaxine HCl 150 MG TB24 Take 1 tablet by mouth daily.        PE: Blood pressure 133/79, pulse 66, height 5\' 8"  (1.727 m), weight 168 lb (76.204 kg). Gen: Alert, well appearing.  Patient is oriented to person, place, time, and situation. LE's: no pitting edema.  Diffuse varicosities without erythema or tenderness.  IMPRESSION AND PLAN:  Varicose veins Focal inflammation symptoms now resolved. Reassured.   An After Visit Summary was printed and given to the patient.   FOLLOW UP: prn

## 2012-10-11 ENCOUNTER — Ambulatory Visit (INDEPENDENT_AMBULATORY_CARE_PROVIDER_SITE_OTHER): Payer: Medicare Other | Admitting: Family Medicine

## 2012-10-11 ENCOUNTER — Encounter: Payer: Self-pay | Admitting: Family Medicine

## 2012-10-11 VITALS — BP 124/82 | HR 86 | Temp 98.9°F | Ht 68.0 in | Wt 170.0 lb

## 2012-10-11 DIAGNOSIS — J019 Acute sinusitis, unspecified: Secondary | ICD-10-CM | POA: Insufficient documentation

## 2012-10-11 MED ORDER — CEFUROXIME AXETIL 500 MG PO TABS: 500.0000 mg | ORAL_TABLET | Freq: Two times a day (BID) | ORAL | Status: DC

## 2012-10-11 NOTE — Progress Notes (Signed)
OFFICE NOTE  10/11/2012  CC:  Chief Complaint  Patient presents with  . Cough    sinus drainage, cough, HA x 2.5 weeks     HPI: Patient is a 71 y.o. Caucasian female who is here for respiratory complaints. Pt presents complaining of respiratory symptoms for 2 wks.  Primary symptoms are: nasal drainage, sinus congestion, PND, cough, HA in frontal/orbital areas, irritated throat and stomach from mucous.  No SOB, chest tightness, or wheezing.  No fevers.  Worst symptoms seems to be the nasal congestion and drainage.  Lately the symptoms seem to be staying the same. Pertinent negatives: No fevers, no wheezing, and no SOB.  No pain in face or teeth.  ST mild at most.   Symptoms made worse by nothing.  Symptoms improved by nothing. Smoker? no Recent sick contact? Yes--several in family with recent viral syndromes Muscle or joint aches? no Flu shot this season at least 2 wks ago? yes  Additional ROS: no n/v/d or abdominal pain.  No rash.  No neck stiffness.   +Mild fatigue.  +Mild appetite loss.   Pertinent PMH:  Past Medical History  Diagnosis Date  . Myalgia     Hx of diffuse myalgias, onset in her 38s  . Thickened endometrium 10/16/2011    on CT.  F/u pelvic u/s showed nabothian cyst to account for this..  No enometrial pathology noted on u/s, no ovaries visualized.  Marland Kitchen Hx: UTI (urinary tract infection)     recurrent.  Pyelo hospitalization 2012  . GERD (gastroesophageal reflux disease)   . Microhematuria 02/2012    Cystoscopy with bladder biopsy and cystouretograms--pathology benign.  . Hypertension   . Vertigo    Past surgical, social, and family history reviewed and no changes noted since last office visit.  MEDS: Alka seltzer cold formula x 1 dose Outpatient Prescriptions Prior to Visit  Medication Sig Dispense Refill  . aspirin EC 81 MG tablet Take 81 mg by mouth daily.      . Multiple Vitamin (MULTIVITAMIN) tablet Take 1 tablet by mouth daily.      . Venlafaxine HCl  150 MG TB24 Take 1 tablet by mouth daily.       Last reviewed on 10/11/2012  3:58 PM by Jeoffrey Massed, MD  PE: Blood pressure 124/82, pulse 86, temperature 98.9 F (37.2 C), temperature source Temporal, height 5\' 8"  (1.727 m), weight 170 lb (77.111 kg), SpO2 97.00%. VS: noted--normal. Gen: alert, NAD, NONTOXIC APPEARING. HEENT: eyes without injection, drainage, or swelling.  Ears: EACs clear, TMs with normal light reflex and landmarks.  Nose: Clear rhinorrhea, with some dried, crusty exudate adherent to mildly injected mucosa.  No purulent d/c.  No paranasal sinus TTP.  No facial swelling.  Throat and mouth without focal lesion.  No pharyngial swelling, erythema, or exudate.   Neck: supple, no LAD.   LUNGS: CTA bilat, nonlabored resps.   CV: RRR, no m/r/g. EXT: no c/c/e SKIN: no rash  IMPRESSION AND PLAN:  Sinusitis, acute Ceftin 500mg  bid x 10d.  Nonsedating antihistamine+decongestant combo OTC. Saline irrigation discussed.   An After Visit Summary was printed and given to the patient.  FOLLOW UP: prn

## 2012-10-11 NOTE — Patient Instructions (Signed)
Buy a small box of generic zyrtec D, allegra D, or claritin D and take as directed. Use nasal saline to irrigate nasal passages 1-3 times per day. Rest and drink lots of clear liquids. Take 1000 mg of tylenol every 6-8h as needed for pain.

## 2012-10-11 NOTE — Assessment & Plan Note (Signed)
Ceftin 500mg  bid x 10d.  Nonsedating antihistamine+decongestant combo OTC. Saline irrigation discussed.

## 2012-10-13 ENCOUNTER — Telehealth: Payer: Self-pay | Admitting: Family Medicine

## 2012-10-13 NOTE — Telephone Encounter (Signed)
Ingredient list for Cactus juice pt was speaking of at her last OV.

## 2012-10-13 NOTE — Telephone Encounter (Signed)
Organic Nopal, superfruit antioxidant supplement. Ingredients: nopal juice, acai, mango steen juice, seabuckthorn juice, aloe vera juice, tart cherry juice, mango puree, papaya puree, turmeric, apple juice concentrate, pomegrate juice, ginger puree & beet root juice. All of these ingriendents are organic certified.

## 2012-12-22 ENCOUNTER — Ambulatory Visit (INDEPENDENT_AMBULATORY_CARE_PROVIDER_SITE_OTHER): Payer: Medicare Other | Admitting: Family Medicine

## 2012-12-22 ENCOUNTER — Encounter: Payer: Self-pay | Admitting: Family Medicine

## 2012-12-22 VITALS — BP 140/84 | HR 71 | Ht 68.0 in | Wt 177.0 lb

## 2012-12-22 DIAGNOSIS — IMO0001 Reserved for inherently not codable concepts without codable children: Secondary | ICD-10-CM

## 2012-12-22 DIAGNOSIS — M791 Myalgia, unspecified site: Secondary | ICD-10-CM

## 2012-12-22 DIAGNOSIS — R202 Paresthesia of skin: Secondary | ICD-10-CM

## 2012-12-22 DIAGNOSIS — R209 Unspecified disturbances of skin sensation: Secondary | ICD-10-CM

## 2012-12-22 MED ORDER — METHYLPREDNISOLONE ACETATE 40 MG/ML IJ SUSP
20.0000 mg | Freq: Once | INTRAMUSCULAR | Status: AC
  Administered 2012-12-22: 20 mg via INTRAMUSCULAR

## 2012-12-22 NOTE — Progress Notes (Signed)
OFFICE NOTE  12/22/2012  CC:  Chief Complaint  Patient presents with  . Neck Pain     HPI: Patient is a 72 y.o. Caucasian female who is here for neck pain.   Onset about 1 wk ago in right upper trapezius region where it intersects with lower neck.  Hurts constantly but worse when moves neck to the right.  Intensity 8/10, alleve helps some for about an hour.  Rubbing OTC icy/hot type substance helped for about 1 hour.   Has no radiation of the pain down arm and no paresthesias down neck/arm.  She does have some wrist and hand (entire) tingling/numbness depending on how she holds things.  This has been going on a couple of months or so.  After last trigger point injection about 4 mo ago she had no pain until this current episode.  No recent trauma or overuse.  Pertinent PMH:  Past Medical History  Diagnosis Date  . Myalgia     Hx of diffuse myalgias, onset in her 82s  . Thickened endometrium 10/16/2011    on CT.  F/u pelvic u/s showed nabothian cyst to account for this..  No enometrial pathology noted on u/s, no ovaries visualized.  Marland Kitchen Hx: UTI (urinary tract infection)     recurrent.  Pyelo hospitalization 2012  . GERD (gastroesophageal reflux disease)   . Microhematuria 02/2012    Cystoscopy with bladder biopsy and cystouretograms--pathology benign.  . Hypertension   . Vertigo    Past Surgical History  Procedure Laterality Date  . Back surgery  1990s x 2    Dr. Jeannine Kitten in [place]  . Cholecystectomy    . Hysteroscopy  11/2004    Cervical hystogram   . Colonoscopy  approx 2002    Normal per pt report ([facility])    MEDS:  Outpatient Prescriptions Prior to Visit  Medication Sig Dispense Refill  . aspirin EC 81 MG tablet Take 81 mg by mouth daily.      . Multiple Vitamin (MULTIVITAMIN) tablet Take 1 tablet by mouth daily.      . Venlafaxine HCl 150 MG TB24 Take 1 tablet by mouth daily.      . cefUROXime (CEFTIN) 500 MG tablet Take 1 tablet (500 mg total) by mouth 2  (two) times daily.  20 tablet  0   No facility-administered medications prior to visit.    PE: Blood pressure 140/84, pulse 71, height 5\' 8"  (1.727 m), weight 177 lb (80.287 kg). Gen: Alert, well appearing.  Patient is oriented to person, place, time, and situation. Sits with head tilted to the left slightly. Neck ROM significantly limited in right lateral flexion and rotation.  Forward flexion is intact but painful on right side of neck at base.  Rotation to the left is fine, extension is fine.  She has tenderness along right base of neck soft tissues, extending down and laterally across the upper trapezius region, with a focal "trigger point" of tenderness that makes her jump when palpated.  ROM of right shoulder brings pain in the same region of tenderness noted previously.  UE strength 5/5 prox and dist bilat.  She has tingling and diminished sensation in right hand diffusely, made worse by both phalen's/tinels maneuvers and by percussion over ulnar nerve in ulnar groove.  Hand color normal, radial pulses normal.  No hand muscle atrophy, hands appear symmetric.  Spurling maneuver with head tilted to the left caused pain in her area of complaint in right base of neck  and trapezius area.  Pain/stiffness prevented spurling testing with head tilted to the right.  IMPRESSION AND PLAN:  Paresthesias in right hand I think she has a component of ulnar compression syndrome at the elbow and median nerve compression at wrist. Encouraged wrist splint nightly (she has one at home) and wrap a towel in crook of elbow while sleeping to prevent hyperflexion of elbow. I told her if these measures don't help over the next month then the next step would be to obtain a NCS/EMG.  Trigger point of right side of body Right upper trapezius region.  This has responded nicely to trigger point injection in the past. I did another today: 1/2 cc of 40mg /ml depo medrol + 1/2 cc of 2% lidocaine without epi injected into  trigger point without problem. No immediate complications.  Pt tolerated procedure well.  Advised pt to do gentle ROM of right shoulder and of neck over the next few days. Ice the area of tenderness tonight.   An After Visit Summary was printed and given to the patient.  FOLLOW UP: prn

## 2012-12-22 NOTE — Assessment & Plan Note (Signed)
I think she has a component of ulnar compression syndrome at the elbow and median nerve compression at wrist. Encouraged wrist splint nightly (she has one at home) and wrap a towel in crook of elbow while sleeping to prevent hyperflexion of elbow. I told her if these measures don't help over the next month then the next step would be to obtain a NCS/EMG.

## 2012-12-22 NOTE — Assessment & Plan Note (Signed)
Right upper trapezius region.  This has responded nicely to trigger point injection in the past. I did another today: 1/2 cc of 40mg /ml depo medrol + 1/2 cc of 2% lidocaine without epi injected into trigger point without problem. No immediate complications.  Pt tolerated procedure well.  Advised pt to do gentle ROM of right shoulder and of neck over the next few days. Ice the area of tenderness tonight.

## 2013-02-08 ENCOUNTER — Encounter: Payer: Self-pay | Admitting: Family Medicine

## 2013-02-08 ENCOUNTER — Ambulatory Visit (INDEPENDENT_AMBULATORY_CARE_PROVIDER_SITE_OTHER): Payer: Medicare PPO | Admitting: Family Medicine

## 2013-02-08 VITALS — BP 110/70 | HR 73 | Temp 98.1°F | Ht 68.0 in | Wt 180.5 lb

## 2013-02-08 DIAGNOSIS — N390 Urinary tract infection, site not specified: Secondary | ICD-10-CM

## 2013-02-08 LAB — POCT URINALYSIS DIPSTICK
Glucose, UA: NEGATIVE
Spec Grav, UA: >=1.03
Urobilinogen, UA: 0.2

## 2013-02-08 MED ORDER — NITROFURANTOIN MONOHYD MACRO 100 MG PO CAPS: 100.0000 mg | ORAL_CAPSULE | Freq: Two times a day (BID) | ORAL | Status: DC

## 2013-02-08 NOTE — Progress Notes (Signed)
OFFICE NOTE  02/08/2013  CC:  Chief Complaint  Patient presents with  . Urinary Tract Infection     HPI: Patient is a 72 y.o. Caucasian female who is here for suspicion of UTI. Onset last night: dysuria, urgency, frequency, diffuse pain in low back region.  Mild nausea, mild HA.  No fever, no vomiting, no gross hematuria, no groin or flank pain.  Pertinent PMH:  Past Medical History  Diagnosis Date  . Myalgia     Hx of diffuse myalgias, onset in her 52s  . Thickened endometrium 10/16/2011    on CT.  F/u pelvic u/s showed nabothian cyst to account for this..  No enometrial pathology noted on u/s, no ovaries visualized.  Marland Kitchen Hx: UTI (urinary tract infection)     recurrent.  Pyelo hospitalization 2012  . GERD (gastroesophageal reflux disease)   . Microhematuria 02/2012    Cystoscopy with bladder biopsy and cystouretograms--pathology benign.  . Hypertension   . Vertigo     MEDS:  Outpatient Prescriptions Prior to Visit  Medication Sig Dispense Refill  . aspirin EC 81 MG tablet Take 81 mg by mouth daily.      . Multiple Vitamin (MULTIVITAMIN) tablet Take 1 tablet by mouth daily.      . Venlafaxine HCl 150 MG TB24 Take 1 tablet by mouth daily.       No facility-administered medications prior to visit.    PE: Blood pressure 110/70, pulse 73, temperature 98.1 F (36.7 C), temperature source Oral, height 5\' 8"  (1.727 m), weight 180 lb 8 oz (81.874 kg), SpO2 95.00%. Gen: Alert, well appearing.  Patient is oriented to person, place, time, and situation. CV: RRR, no m/r/g.   LUNGS: CTA bilat, nonlabored resps, good aeration in all lung fields. BACK: no CVA tenderness. She has some mild diffuse tenderness in lumbar soft tissues.   ABD: soft, NT  LAB: CC UA showed + blood and LEU, otherwise normal.  IMPRESSION AND PLAN:  UTI, lower.  Hx of pyelo. Sent urine for c/s, started macrobid 100mg  bid x 5d. Signs/symptoms to call or return for were reviewed and pt expressed  understanding.   FOLLOW UP: prn  ADDENDUM 02/11/13: urine clx grew only 15,000 colonies of single bacterial strain, but showed intermediate sensitivity to macrobid. Talked to pt's daughter on phone and she is feeling a little better, definitely not worse.  Will go ahead and change to bactrim--called rx in to her local pharmacy.--PM

## 2013-02-10 ENCOUNTER — Other Ambulatory Visit: Payer: Self-pay | Admitting: Family Medicine

## 2013-02-10 LAB — URINE CULTURE: Colony Count: 15000

## 2013-02-10 MED ORDER — SULFAMETHOXAZOLE-TRIMETHOPRIM 800-160 MG PO TABS: 1.0000 | ORAL_TABLET | Freq: Two times a day (BID) | ORAL | Status: DC

## 2013-02-11 ENCOUNTER — Encounter: Payer: Self-pay | Admitting: Family Medicine

## 2013-03-08 ENCOUNTER — Telehealth: Payer: Self-pay | Admitting: *Deleted

## 2013-03-08 NOTE — Telephone Encounter (Signed)
Patient confirmed that she is currently taking Effexor 150 mg/SLS

## 2013-03-08 NOTE — Telephone Encounter (Signed)
Patient states she previously took Cymbalta for Fibromyalgia and request to resume medication d/t "aching"/SLS Please advise.

## 2013-03-08 NOTE — Telephone Encounter (Signed)
Review of med list shows she is currently on effexor.  Pls ask patient if this is accurate, then I can give my recommendation regarding the cymbalta.  Thx

## 2013-03-08 NOTE — Telephone Encounter (Signed)
LMOM with contact name and number for return call RE: medication request and further provider instructions/SLS  

## 2013-03-09 MED ORDER — DULOXETINE HCL 30 MG PO CPEP: 30.0000 mg | ORAL_CAPSULE | Freq: Every day | ORAL | Status: DC

## 2013-03-09 NOTE — Telephone Encounter (Signed)
Pt's daughter called RE: pt restarting Cymbalta; explained previous communication with patient & what was stated RE: medications. Caller states that pt is "in pain and has not taken the Effexor in a long time because it didn't help with the pain"; informed caller that this was contradictory to pt's statement on 05.15.14. Informed will call pt to clarify once more/SLS  Spoke with patient & she states that she has not taken since beginning to see Dr. Milinda Cave [10.2013], "it did not help"; pt states that provider did not feel as though "it was Fibromyalgia" when she was seen in office. Informed pt that provider will authorize new Rx for Cymbalta with a scheduled f/u visit w/i one month; pt scheduled for Mon, 05.19.14 at 1:30p for medication management/SLS Please advise on instructions for Cymbalta Rx.

## 2013-03-09 NOTE — Telephone Encounter (Signed)
rx done

## 2013-03-09 NOTE — Telephone Encounter (Signed)
Will do cymbalta rx to pharmacy. Please reschedule o/v for med mgmt in 3 weeks instead of 3d from now.--thx

## 2013-03-09 NOTE — Telephone Encounter (Signed)
LMOM with contact name and number for return call RE: rescheduling of Mon, 05.19.14 appointment for 3-wks from start of medication per provider instructions; will try to reach pt again on Monday morning , if no return call/SLS

## 2013-03-12 ENCOUNTER — Ambulatory Visit: Payer: Medicare PPO | Admitting: Family Medicine

## 2013-03-12 NOTE — Telephone Encounter (Signed)
Spoke w/pt's daughter, who states that pt received message & she is going to make f/u appointment today when she comes into office w/spouse [1:30p OV], provider informed/SLS

## 2013-04-06 ENCOUNTER — Encounter: Payer: Self-pay | Admitting: Family Medicine

## 2013-04-06 ENCOUNTER — Ambulatory Visit (INDEPENDENT_AMBULATORY_CARE_PROVIDER_SITE_OTHER): Payer: Medicare Other | Admitting: Family Medicine

## 2013-04-06 ENCOUNTER — Telehealth: Payer: Self-pay | Admitting: Family Medicine

## 2013-04-06 VITALS — BP 124/85 | HR 69 | Temp 97.9°F | Resp 14 | Wt 175.5 lb

## 2013-04-06 DIAGNOSIS — M791 Myalgia, unspecified site: Secondary | ICD-10-CM

## 2013-04-06 DIAGNOSIS — Z1211 Encounter for screening for malignant neoplasm of colon: Secondary | ICD-10-CM | POA: Insufficient documentation

## 2013-04-06 DIAGNOSIS — IMO0001 Reserved for inherently not codable concepts without codable children: Secondary | ICD-10-CM

## 2013-04-06 MED ORDER — DULOXETINE HCL 30 MG PO CPEP: 30.0000 mg | ORAL_CAPSULE | Freq: Every day | ORAL | Status: DC

## 2013-04-06 NOTE — Assessment & Plan Note (Signed)
Question of whether it is time for repeat colonoscopy. Will contact [facility] and find out exactly when the last one was done.

## 2013-04-06 NOTE — Progress Notes (Signed)
OFFICE NOTE  04/06/2013  CC:  Chief Complaint  Patient presents with  . Follow-up    Medication Management-Cymbalta     HPI: Patient is a 72 y.o. Caucasian female who is here for med f/u--cymbalta started about 1 mo ago for her myalgias. Of note, she is not on effexor anymore--no help.  She says she feels normal now, very good, no pain.  Her mood is normal. She denies side effects from med.  Pertinent PMH:  Past Medical History  Diagnosis Date  . Myalgia     Hx of diffuse myalgias, onset in her 75s  . Thickened endometrium 10/16/2011    on CT.  F/u pelvic u/s showed nabothian cyst to account for this..  No enometrial pathology noted on u/s, no ovaries visualized.  Marland Kitchen Hx: UTI (urinary tract infection)     recurrent.  Pyelo hospitalization 2012  . GERD (gastroesophageal reflux disease)   . Microhematuria 02/2012    Cystoscopy with bladder biopsy and cystouretograms--pathology benign.  . Hypertension   . Vertigo    Past surgical, social, and family history reviewed and no changes noted since last office visit.  MEDS:  Cymbalta 30 mg qd, ASA 81 mg qd  PE: Blood pressure 124/85, pulse 69, temperature 97.9 F (36.6 C), temperature source Oral, resp. rate 14, weight 175 lb 8 oz (79.606 kg), SpO2 95.00%. Gen: Alert, well appearing.  Patient is oriented to person, place, time, and situation. CV: RRR, no m/r/g.   LUNGS: CTA bilat, nonlabored resps, good aeration in all lung fields. MUSC: palpation of all soft tissue areas of shoulders, back, arms, hips, and legs revealed no areas of tenderness.    IMPRESSION AND PLAN:  Myalgia Her myalgias are much improved/essentially resolved with cymbalta 30mg  qd. Continue this med, RF's given today.   Colon cancer screening Question of whether it is time for repeat colonoscopy. Will contact [facility] and find out exactly when the last one was done.   An After Visit Summary was printed and given to the patient.  FOLLOW  UP: 81mo

## 2013-04-06 NOTE — Telephone Encounter (Signed)
Spoke w/Ginger at [facility], stated in computer "looks like roughly about 2002" for last colonoscopy for patient at their facility, she checked for paper chart to determine when pt was due again but was unable to locate; will research and return call/SLS

## 2013-04-06 NOTE — Telephone Encounter (Signed)
[facility]s call [facility] [place] and check when her last colonoscopy was, see if she is due for repeat (patient doesn't have this info)-thx

## 2013-04-06 NOTE — Assessment & Plan Note (Signed)
Her myalgias are much improved/essentially resolved with cymbalta 30mg  qd. Continue this med, RF's given today.

## 2013-04-11 ENCOUNTER — Telehealth: Payer: Self-pay

## 2013-04-11 NOTE — Telephone Encounter (Signed)
Jasmine December from Dr [facility] Dada office called for you this afternoon. You had spoke to her earlier about finding out the last date of TCS and when the next one would be due. I could not find anything in epic or echart where we have seen this patient. Could you call Jasmine December back to confirm [phone]

## 2013-04-11 NOTE — Telephone Encounter (Signed)
Ginger out of office for the day; will have her return call tomorrow/SLS

## 2013-04-12 ENCOUNTER — Encounter: Payer: Self-pay | Admitting: Family Medicine

## 2013-04-12 NOTE — Telephone Encounter (Signed)
I called Jasmine December and told her that I was not able to find anything but I will call the hospital and check

## 2013-04-12 NOTE — Telephone Encounter (Signed)
Ginger returned call to inform that there is no date on their records as to when pt's colonoscopy was done & she is going to call [facility] to check their records prior to office going electronic to attempt to find date/SLS

## 2013-04-12 NOTE — Telephone Encounter (Signed)
Ginger returned call after researching with [facility] and the only thing in pt's record is [2001]: diverticulosis & hemorrhoids; she states she would be due since it has been well over [10] years/SLS Please advise.

## 2013-04-12 NOTE — Telephone Encounter (Signed)
Pls notify pt that she is due for repeat screening colonoscopy.  She needs to just call [facility] in [place] where she had her first colonoscopy and tell them she needs f/u for this.---thx

## 2013-04-13 NOTE — Telephone Encounter (Signed)
lmom for pt to return call. °

## 2013-04-13 NOTE — Telephone Encounter (Signed)
Patient aware and will call to schedule colonoscopy.

## 2013-04-16 NOTE — Telephone Encounter (Signed)
The hospital was able to find a TCS that was done in 04/2000. I faxed it to [facility] and I am also going to scan it into Epic.

## 2013-10-05 ENCOUNTER — Ambulatory Visit: Payer: Medicare Other | Admitting: Family Medicine

## 2013-12-31 ENCOUNTER — Ambulatory Visit: Payer: Medicare Other | Admitting: Family Medicine

## 2013-12-31 ENCOUNTER — Encounter: Payer: Self-pay | Admitting: Family Medicine

## 2013-12-31 ENCOUNTER — Ambulatory Visit (INDEPENDENT_AMBULATORY_CARE_PROVIDER_SITE_OTHER): Payer: Medicare PPO | Admitting: Family Medicine

## 2013-12-31 VITALS — BP 124/84 | HR 79 | Temp 98.0°F | Resp 18 | Ht 67.0 in | Wt 176.0 lb

## 2013-12-31 DIAGNOSIS — Z1239 Encounter for other screening for malignant neoplasm of breast: Secondary | ICD-10-CM | POA: Insufficient documentation

## 2013-12-31 DIAGNOSIS — M25512 Pain in left shoulder: Secondary | ICD-10-CM | POA: Insufficient documentation

## 2013-12-31 DIAGNOSIS — Z780 Asymptomatic menopausal state: Secondary | ICD-10-CM

## 2013-12-31 DIAGNOSIS — M25519 Pain in unspecified shoulder: Secondary | ICD-10-CM

## 2013-12-31 NOTE — Patient Instructions (Addendum)
Follow up as needed

## 2013-12-31 NOTE — Assessment & Plan Note (Signed)
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 However, some sx's are suggestive of cervical DDD with left arm radiculopathy.  Plan is to get left shoulder x-ray and C-spine x-ray as first step. No meds rx'd today. May ultimately offer subacromial steroid injection vs further w/u for c-spine spondylosis.

## 2013-12-31 NOTE — Progress Notes (Signed)
OFFICE NOTE  12/31/2013  CC:  Chief Complaint  Patient presents with  . Place on arm    left arm     HPI: Patient is a 73 y.o. Caucasian female who is here for "place on left arm". Describes several months of worsening pain in left shoulder, extends down into left upper arm down to level of elbow.  Hurts at rest and inhibits sleep at night but much worse pain with reaching out or doing shoulder ROM, worse after active day, esp lifting dishes during the day. Describes hx of left shoulder x-ray approx 7 yrs ago showing "bone on bone" arthritis. Also says she has vague feeling of tingling/numbness in same region of pain in left arm/shoulder.  Question of feeling same occ in left hand.  She is not sure if neck movements make any of her sx's come on or get worse.  Pertinent PMH:  Past medical, surgical, social, and family history reviewed and no changes are noted since last office visit.  MEDS:  Outpatient Prescriptions Prior to Visit  Medication Sig Dispense Refill  . aspirin EC 81 MG tablet Take 81 mg by mouth daily.      . DULoxetine (CYMBALTA) 30 MG capsule Take 1 capsule (30 mg total) by mouth daily.  90 capsule  1  . Multiple Vitamin (MULTIVITAMIN) tablet Take 1 tablet by mouth daily.       No facility-administered medications prior to visit.    PE: Blood pressure 124/84, pulse 79, temperature 98 F (36.7 C), temperature source Oral, resp. rate 18, SpO2 92.00%. Gen: Alert, well appearing.  Patient is oriented to person, place, time, and situation. Mild TTP in left C-spine soft tissues, left upper trapezius region, and around left acromion and over left deltoid region.  No midline cervical tenderness.  No tenderness over AC joint.  Marked pain with left shoulder ROM, particularly aBduction (she gets it up to 80 deg only) but also with ER and IR.  +Impingement signs.  Negative drop arm sign.  UE strength 5/5 prox and dist bilat. No sensory deficits.  No color or temperature  abnormalities.   Spurling's testing is positive on left: leads to pain down into left shoulder and upper arm with vague tingling/numbness in same region.    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 However, some sx's are suggestive of cervical DDD with left arm radiculopathy.  Plan is to get left shoulder x-ray and C-spine x-ray as first step. No meds rx'd today. May ultimately offer subacromial steroid injection vs further w/u for c-spine spondylosis.  Breast cancer screening Overdue for screening mammogram. Ordered this today to be done at [facility] in [place].   An After Visit Summary was printed and given to the patient.  FOLLOW UP: To be determined based on results of pending workup.

## 2013-12-31 NOTE — Progress Notes (Signed)
Pre visit review using our clinic review tool, if applicable. No additional management support is needed unless otherwise documented below in the visit note. 

## 2013-12-31 NOTE — Assessment & Plan Note (Signed)
Overdue for screening mammogram. Ordered this today to be done at [facility] in [place].

## 2014-01-01 ENCOUNTER — Other Ambulatory Visit: Payer: Self-pay | Admitting: Family Medicine

## 2014-01-01 DIAGNOSIS — Z1231 Encounter for screening mammogram for malignant neoplasm of breast: Secondary | ICD-10-CM

## 2014-01-01 NOTE — Addendum Note (Signed)
Addended by: Jannette Spanner on: 01/01/2014 11:42 AM   Modules accepted: Orders

## 2014-01-01 NOTE — Addendum Note (Signed)
Addended by: Tammi Sou on: 01/01/2014 11:53 AM   Modules accepted: Orders

## 2014-01-08 ENCOUNTER — Ambulatory Visit (HOSPITAL_COMMUNITY)
Admission: RE | Admit: 2014-01-08 | Discharge: 2014-01-08 | Disposition: A | Discharge status: Home / Self Care | Payer: Medicare PPO | Source: Ambulatory Visit | Attending: Family Medicine | Admitting: Family Medicine

## 2014-01-08 ENCOUNTER — Encounter: Payer: Self-pay | Admitting: Family Medicine

## 2014-01-08 DIAGNOSIS — Z1231 Encounter for screening mammogram for malignant neoplasm of breast: Secondary | ICD-10-CM | POA: Insufficient documentation

## 2014-01-08 DIAGNOSIS — M25519 Pain in unspecified shoulder: Secondary | ICD-10-CM

## 2014-01-11 ENCOUNTER — Encounter: Payer: Self-pay | Admitting: Family Medicine

## 2014-01-11 ENCOUNTER — Ambulatory Visit (INDEPENDENT_AMBULATORY_CARE_PROVIDER_SITE_OTHER): Payer: Medicare PPO | Admitting: Family Medicine

## 2014-01-11 VITALS — BP 136/87 | HR 80 | Temp 98.2°F | Resp 18 | Ht 67.0 in | Wt 167.0 lb

## 2014-01-11 DIAGNOSIS — M25512 Pain in left shoulder: Secondary | ICD-10-CM

## 2014-01-11 DIAGNOSIS — M25519 Pain in unspecified shoulder: Secondary | ICD-10-CM

## 2014-01-11 MED ORDER — METHYLPREDNISOLONE ACETATE 40 MG/ML IJ SUSP
40.0000 mg | Freq: Once | INTRAMUSCULAR | Status: AC
  Administered 2014-01-11: 40 mg via INTRA_ARTICULAR

## 2014-01-11 NOTE — Progress Notes (Signed)
OFFICE NOTE  01/11/2014  CC:  Chief Complaint  Patient presents with  . Injections    Left shoulder injection     HPI: Patient is a 73 y.o. Caucasian female who is here for steroid injection in left shoulder. Review note from 12/31/13.  Pertinent PMH:  Past medical, surgical, social, and family history reviewed and no changes are noted since last office visit.  MEDS:  Outpatient Prescriptions Prior to Visit  Medication Sig Dispense Refill  . aspirin EC 81 MG tablet Take 81 mg by mouth daily.      . DULoxetine (CYMBALTA) 30 MG capsule Take 1 capsule (30 mg total) by mouth daily.  90 capsule  1  . Multiple Vitamin (MULTIVITAMIN) tablet Take 1 tablet by mouth daily.       No facility-administered medications prior to visit.    PE: Blood pressure 136/87, pulse 80, temperature 98.2 F (36.8 C), temperature source Oral, resp. rate 18, height 5\' 7"  (1.702 m), weight 167 lb (75.751 kg), SpO2 94.00%. Left shoulder with painful ROM, decreased ROM (abduction particularly). Mild peri-acromial TTP in left shoulder.   IMPRESSION AND PLAN:  Left shoulder pain: RC tendonitis/subacromial bursitis VS C spine arthritis with radiculopathy.  Therapeutic/diagnostic lef shoulder subacromial injection today.  Procedure: Therapeutic/diagnostic left shoulder injection.  The patient's clinical condition is marked by substantial pain and/or significant functional disability.  Other conservative therapy has not provided relief, is contraindicated, or not appropriate.  There is a reasonable likelihood that injection will significantly improve the patient's pain and/or functional disability. Cleaned skin with alcohol swab, used posterolateral approach, Injected 2ml of 40mg /ml depo medrol + 2 ml of 1% lidocaine w/out epi into subacromial space without resistance.  No immediate complications.  Patient tolerated procedure well.  Post-injection care discussed, including 20 min of icing 1-2 times in the next 4-8  hours, frequent non weight-bearing ROM exercises over the next few days, and general pain medication management.  An After Visit Summary was printed and given to the patient.  FOLLOW UP: prn

## 2014-02-19 ENCOUNTER — Ambulatory Visit (INDEPENDENT_AMBULATORY_CARE_PROVIDER_SITE_OTHER): Payer: Medicare PPO | Admitting: Family Medicine

## 2014-02-19 ENCOUNTER — Encounter: Payer: Self-pay | Admitting: Family Medicine

## 2014-02-19 VITALS — BP 124/81 | HR 73 | Temp 97.4°F | Resp 18 | Ht 67.0 in | Wt 175.0 lb

## 2014-02-19 DIAGNOSIS — J069 Acute upper respiratory infection, unspecified: Secondary | ICD-10-CM | POA: Insufficient documentation

## 2014-02-19 NOTE — Progress Notes (Signed)
OFFICE NOTE  02/19/2014  CC:  Chief Complaint  Patient presents with  . Sinusitis    3 days  . Cough  . Headache     HPI: Patient is a 73 y.o. Caucasian female who is here for cough. Onset 3-4 d ago cough, ST, ears hurt, nose runny/stuffy, PND, feels fatigued.  No fever.  Head pounding in temples and forehead.  No pain in face or upper teeth. Took benadryl last night and didn't help any: "I coughed all night".  No wheezing, chest tightness, or SOB.   Pertinent PMH:  Past medical, surgical, social, and family history reviewed and no changes are noted since last office visit.  MEDS:  Outpatient Prescriptions Prior to Visit  Medication Sig Dispense Refill  . aspirin EC 81 MG tablet Take 81 mg by mouth daily.      . DULoxetine (CYMBALTA) 30 MG capsule Take 1 capsule (30 mg total) by mouth daily.  90 capsule  1  . Multiple Vitamin (MULTIVITAMIN) tablet Take 1 tablet by mouth daily.       No facility-administered medications prior to visit.    PE: Blood pressure 124/81, pulse 73, temperature 97.4 F (36.3 C), temperature source Temporal, resp. rate 18, height 5\' 7"  (1.702 m), weight 175 lb (79.379 kg), SpO2 97.00%. VS: noted--normal. Gen: alert, NAD, NONTOXIC APPEARING. HEENT: eyes without injection, drainage, or swelling.  Ears: EACs clear, TMs with normal light reflex and landmarks.  Nose: Clear rhinorrhea, with some dried, crusty exudate adherent to mildly injected mucosa.  No purulent d/c.  No paranasal sinus TTP.  No facial swelling.  Throat and mouth without focal lesion.  No pharyngial swelling, erythema, or exudate.   Neck: supple, no LAD.   LUNGS: CTA bilat, nonlabored resps.   CV: RRR, no m/r/g. EXT: no c/c/e SKIN: no rash  LAB: none  IMPRESSION AND PLAN:  Viral URI. Trial of mucinex DM or robitussin DM otc as directed on the box. May use OTC nasal saline spray or irrigation solution bid. OTC nonsedating antihistamines prn discussed.  Decongestant use  discussed--ok if tolerated in the past w/out side effect and if pt has no hx of HTN.  An After Visit Summary was printed and given to the patient.  FOLLOW UP: prn

## 2014-02-19 NOTE — Patient Instructions (Signed)
Trial of mucinex DM or robitussin DM otc as directed on the box. May use OTC nasal saline spray or irrigation solution bid. OTC nonsedating antihistamines prn discussed (allegra 60mg  twice a day--generic).

## 2014-02-19 NOTE — Progress Notes (Signed)
Pre visit review using our clinic review tool, if applicable. No additional management support is needed unless otherwise documented below in the visit note. 

## 2014-07-17 ENCOUNTER — Encounter: Payer: Self-pay | Admitting: Nurse Practitioner

## 2014-07-17 ENCOUNTER — Ambulatory Visit: Payer: Medicare PPO | Admitting: Family Medicine

## 2014-07-17 ENCOUNTER — Ambulatory Visit (INDEPENDENT_AMBULATORY_CARE_PROVIDER_SITE_OTHER): Payer: Medicare PPO | Admitting: Nurse Practitioner

## 2014-07-17 ENCOUNTER — Telehealth: Payer: Self-pay

## 2014-07-17 VITALS — BP 138/86 | HR 71 | Temp 98.1°F | Resp 18 | Ht 67.0 in | Wt 177.0 lb

## 2014-07-17 DIAGNOSIS — R300 Dysuria: Secondary | ICD-10-CM

## 2014-07-17 DIAGNOSIS — N949 Unspecified condition associated with female genital organs and menstrual cycle: Secondary | ICD-10-CM

## 2014-07-17 DIAGNOSIS — N9489 Other specified conditions associated with female genital organs and menstrual cycle: Secondary | ICD-10-CM

## 2014-07-17 DIAGNOSIS — Z23 Encounter for immunization: Secondary | ICD-10-CM

## 2014-07-17 LAB — POCT URINALYSIS DIPSTICK
Blood, UA: NEGATIVE
GLUCOSE UA: NEGATIVE
NITRITE UA: NEGATIVE
Protein, UA: 30
Spec Grav, UA: >=1.03
Urobilinogen, UA: 0.2
pH, UA: 5.5

## 2014-07-17 LAB — POCT WET PREP WITH KOH
CLUE CELLS WET PREP PER HPF POC: NEGATIVE
KOH Prep POC: NEGATIVE
RBC Wet Prep HPF POC: NEGATIVE
Trichomonas, UA: NEGATIVE
Yeast Wet Prep HPF POC: NEGATIVE

## 2014-07-17 MED ORDER — NITROFURANTOIN MONOHYD MACRO 100 MG PO CAPS: 100.0000 mg | ORAL_CAPSULE | Freq: Two times a day (BID) | ORAL | Status: DC

## 2014-07-17 NOTE — Progress Notes (Signed)
   Subjective:    Patient ID: Karen Delacruz, female    DOB: 1941-02-17, 73 y.o.   MRN: [id number]  Dysuria  This is a new problem. The current episode started yesterday. The problem occurs every urination. The problem has been gradually worsening. The quality of the pain is described as burning. The pain is mild. There has been no fever. She is sexually active. Associated symptoms include a discharge (clear vaginal d/c) and flank pain (bilateral). Pertinent negatives include no chills, frequency, hematuria, hesitancy, nausea, sweats, urgency or vomiting. Associated symptoms comments: "raw feeling" in vagina after intercourse, occasional odor.. She has tried nothing for the symptoms.      Review of Systems  Constitutional: Negative for fever, chills and fatigue.  Respiratory: Negative for cough.   Gastrointestinal: Negative for nausea, vomiting, abdominal pain and diarrhea.  Genitourinary: Positive for dysuria, flank pain (bilateral), vaginal discharge and vaginal pain. Negative for hesitancy, urgency, frequency and hematuria.  Musculoskeletal: Positive for back pain.       Objective:   Physical Exam  Vitals reviewed. Constitutional: She is oriented to person, place, and time. She appears well-developed and well-nourished. No distress.  Eyes: Conjunctivae are normal. Right eye exhibits no discharge. Left eye exhibits no discharge.  Cardiovascular: Normal rate, regular rhythm and normal heart sounds.   No murmur heard. Pulmonary/Chest: Effort normal and breath sounds normal. No respiratory distress. She has no wheezes. She has no rales.  Abdominal: Soft. She exhibits no distension (suprapubic discomfort) and no mass. There is tenderness. There is no rebound and no guarding.  Genitourinary: Vagina normal. No vaginal discharge found.  Prolapsed bladder. Vaginal atrophy. O/w nml.  Musculoskeletal: She exhibits tenderness (bilat CVA tenderness).  Neurological: She is alert and oriented to  person, place, and time.  Skin: Skin is warm and dry.  Psychiatric: She has a normal mood and affect. Her behavior is normal. Thought content normal.          Assessment & Plan:  1. Dysuria - Urine culture - POCT urinalysis dipstick-leuks, pro, blood. - nitrofurantoin, macrocrystal-monohydrate, (MACROBID) 100 MG capsule; Take 1 capsule (100 mg total) by mouth 2 (two) times daily.  Dispense: 10 capsule; Refill: 0 F/u 4 weeks  2. Vaginal burning Likely due to atrophic vaginitis - POCT Wet Prep with KOH-neg for clue & yeast Use water soluble lubricant during intercourse  3. Need for prophylactic vaccination and inoculation against influenza  Needs CPE.

## 2014-07-17 NOTE — Patient Instructions (Signed)
Start antibiotic. Our office will call if we need to change the antibiotic. Sip hydrating fluids (water, juice, colorless soda, decaff tea) every hour to flush kidneys.  Use a water soluble lubricant when having intercourse, like K-Y jelly.  Return in 1 month to recheck urine or sooner if symptoms do not improve or you feel worse.  Urinary Tract Infection Urinary tract infections (UTIs) can develop anywhere along your urinary tract. Your urinary tract is your body's drainage system for removing wastes and extra water. Your urinary tract includes two kidneys, two ureters, a bladder, and a urethra. Your kidneys are a pair of bean-shaped organs. Each kidney is about the size of your fist. They are located below your ribs, one on each side of your spine. CAUSES Infections are caused by microbes, which are microscopic organisms, including fungi, viruses, and bacteria. These organisms are so small that they can only be seen through a microscope. Bacteria are the microbes that most commonly cause UTIs. SYMPTOMS  Symptoms of UTIs may vary by age and gender of the patient and by the location of the infection. Symptoms in young women typically include a frequent and intense urge to urinate and a painful, burning feeling in the bladder or urethra during urination. Older women and men are more likely to be tired, shaky, and weak and have muscle aches and abdominal pain. A fever may mean the infection is in your kidneys. Other symptoms of a kidney infection include pain in your back or sides below the ribs, nausea, and vomiting. DIAGNOSIS To diagnose a UTI, your caregiver will ask you about your symptoms. Your caregiver also will ask to provide a urine sample. The urine sample will be tested for bacteria and white blood cells. White blood cells are made by your body to help fight infection. TREATMENT  Typically, UTIs can be treated with medication. Because most UTIs are caused by a bacterial infection, they usually  can be treated with the use of antibiotics. The choice of antibiotic and length of treatment depend on your symptoms and the type of bacteria causing your infection. HOME CARE INSTRUCTIONS  If you were prescribed antibiotics, take them exactly as your caregiver instructs you. Finish the medication even if you feel better after you have only taken some of the medication.  Drink enough water and fluids to keep your urine clear or pale yellow.  Avoid caffeine, tea, and carbonated beverages. They tend to irritate your bladder.  Empty your bladder often. Avoid holding urine for long periods of time.  Empty your bladder before and after sexual intercourse.  After a bowel movement, women should cleanse from front to back. Use each tissue only once. SEEK MEDICAL CARE IF:   You have back pain.  You develop a fever.  Your symptoms do not begin to resolve within 3 days. SEEK IMMEDIATE MEDICAL CARE IF:   You have severe back pain or lower abdominal pain.  You develop chills.  You have nausea or vomiting.  You have continued burning or discomfort with urination. MAKE SURE YOU:   Understand these instructions.  Will watch your condition.  Will get help right away if you are not doing well or get worse. Document Released: 07/21/2005 Document Revised: 04/11/2012 Document Reviewed: 11/19/2011 [facility] Patient Information 2014 [employer].

## 2014-07-17 NOTE — Progress Notes (Signed)
Pre visit review using our clinic review tool, if applicable. No additional management support is needed unless otherwise documented below in the visit note. 

## 2014-07-17 NOTE — Telephone Encounter (Signed)
Pt was here to see Layne and would like a refill on her Cymbalta. Please advise.

## 2014-07-18 MED ORDER — DULOXETINE HCL 30 MG PO CPEP: 30.0000 mg | ORAL_CAPSULE | Freq: Every day | ORAL | Status: DC

## 2014-07-18 NOTE — Telephone Encounter (Signed)
Pt.notified

## 2014-07-18 NOTE — Telephone Encounter (Signed)
Cymbalta RF done as per pt request.

## 2014-07-19 ENCOUNTER — Telehealth: Payer: Self-pay | Admitting: Nurse Practitioner

## 2014-07-19 DIAGNOSIS — N3001 Acute cystitis with hematuria: Secondary | ICD-10-CM

## 2014-07-19 LAB — URINE CULTURE: Colony Count: >=100000

## 2014-07-19 MED ORDER — CEPHALEXIN 250 MG PO CAPS: 250.0000 mg | ORAL_CAPSULE | Freq: Four times a day (QID) | ORAL | Status: DC

## 2014-07-19 NOTE — Telephone Encounter (Signed)
pls call pt: Advise ABX needs to be changed. She will stop nitrofurantoin & start cephalexin. She will take new abx 4 times daily for 5 days.

## 2014-07-19 NOTE — Telephone Encounter (Signed)
Spoke with pt, advised to change Rx. Advised pt new Rx sent to pharmacy.

## 2014-08-12 ENCOUNTER — Other Ambulatory Visit (INDEPENDENT_AMBULATORY_CARE_PROVIDER_SITE_OTHER): Payer: Medicare PPO

## 2014-08-12 DIAGNOSIS — E785 Hyperlipidemia, unspecified: Secondary | ICD-10-CM

## 2014-08-12 DIAGNOSIS — Z0000 Encounter for general adult medical examination without abnormal findings: Secondary | ICD-10-CM

## 2014-08-12 LAB — COMPREHENSIVE METABOLIC PANEL
ALT: 13 U/L (ref 0–35)
AST: 20 U/L (ref 0–37)
Albumin: 3.4 g/dL — ABNORMAL LOW (ref 3.5–5.2)
Alkaline Phosphatase: 93 U/L (ref 39–117)
BUN: 14 mg/dL (ref 6–23)
CALCIUM: 9.1 mg/dL (ref 8.4–10.5)
CHLORIDE: 103 meq/L (ref 96–112)
CO2: 26 meq/L (ref 19–32)
CREATININE: 0.6 mg/dL (ref 0.4–1.2)
GFR: 108.44 mL/min (ref >60.00)
GLUCOSE: 89 mg/dL (ref 70–99)
Potassium: 4.4 mEq/L (ref 3.5–5.1)
Sodium: 141 mEq/L (ref 135–145)
Total Bilirubin: 0.6 mg/dL (ref 0.2–1.2)
Total Protein: 7.4 g/dL (ref 6.0–8.3)

## 2014-08-12 LAB — CBC WITH DIFFERENTIAL/PLATELET
BASOS PCT: 1.7 % (ref 0.0–3.0)
Basophils Absolute: 0.1 10*3/uL (ref 0.0–0.1)
EOS ABS: 0.4 10*3/uL (ref 0.0–0.7)
Eosinophils Relative: 5.8 % — ABNORMAL HIGH (ref 0.0–5.0)
HCT: 43.2 % (ref 36.0–46.0)
Hemoglobin: 14.3 g/dL (ref 12.0–15.0)
LYMPHS PCT: 35.8 % (ref 12.0–46.0)
Lymphs Abs: 2.2 10*3/uL (ref 0.7–4.0)
MCHC: 33 g/dL (ref 30.0–36.0)
MCV: 96.7 fl (ref 78.0–100.0)
Monocytes Absolute: 0.6 10*3/uL (ref 0.1–1.0)
Monocytes Relative: 9.4 % (ref 3.0–12.0)
NEUTROS ABS: 2.9 10*3/uL (ref 1.4–7.7)
Neutrophils Relative %: 47.3 % (ref 43.0–77.0)
Platelets: 226 10*3/uL (ref 150.0–400.0)
RBC: 4.47 Mil/uL (ref 3.87–5.11)
RDW: 13 % (ref 11.5–15.5)
WBC: 6.2 10*3/uL (ref 4.0–10.5)

## 2014-08-12 LAB — LIPID PANEL
CHOLESTEROL: 179 mg/dL (ref 0–200)
HDL: 40 mg/dL (ref >39.00)
LDL Cholesterol: 107 mg/dL — ABNORMAL HIGH (ref 0–99)
NonHDL: 139
Total CHOL/HDL Ratio: 4
Triglycerides: 158 mg/dL — ABNORMAL HIGH (ref 0.0–149.0)
VLDL: 31.6 mg/dL (ref 0.0–40.0)

## 2014-08-12 LAB — TSH: TSH: 2.07 u[IU]/mL (ref 0.35–4.50)

## 2014-08-21 ENCOUNTER — Ambulatory Visit: Payer: Medicare PPO | Admitting: Family Medicine

## 2014-08-21 ENCOUNTER — Encounter: Payer: Self-pay | Admitting: Family Medicine

## 2014-08-21 VITALS — BP 119/80 | HR 74 | Temp 98.9°F | Resp 18 | Ht 67.0 in | Wt 177.0 lb

## 2014-08-21 DIAGNOSIS — Z23 Encounter for immunization: Secondary | ICD-10-CM

## 2014-08-21 DIAGNOSIS — K219 Gastro-esophageal reflux disease without esophagitis: Secondary | ICD-10-CM | POA: Insufficient documentation

## 2014-08-21 DIAGNOSIS — E785 Hyperlipidemia, unspecified: Secondary | ICD-10-CM | POA: Insufficient documentation

## 2014-08-21 DIAGNOSIS — Z1211 Encounter for screening for malignant neoplasm of colon: Secondary | ICD-10-CM

## 2014-08-21 DIAGNOSIS — M797 Fibromyalgia: Secondary | ICD-10-CM | POA: Insufficient documentation

## 2014-08-21 DIAGNOSIS — Z0000 Encounter for general adult medical examination without abnormal findings: Secondary | ICD-10-CM | POA: Insufficient documentation

## 2014-08-21 NOTE — Assessment & Plan Note (Signed)
She calculates out to a 10.4% 10 yr CV/CVA risk. Discussed starting statin but decided on at least 1 yr trial of TLC. Discussed exercise requirements necessary for CV health and wt loss, gave DASH diet handout.

## 2014-08-21 NOTE — Progress Notes (Signed)
Office Note 08/21/2014  CC:  Routine chronic illness f/u  HPI:  Karen Delacruz is a 73 y.o. White female who is here for f/u hx of HTN--on no meds, GERD, fibromyalgia, and DDD of C spine.  She feels like the cymbalta is helping with her fibro pain as well as possibly helping chronic anxiety.   GERD: no regular meds required. No urine sx's since last UTI 06/2014. She does not monitor her bp. She is active at work 6 hours a day, but no formal exercise regimen.  Diet: "I just eat what I want". She gets annual GYN visit and this is planned for 10/2014.  Cataract surgery for right eye planned for next month.  We reviewed recent CBC, CMET, TSH, and FLP results in detail today.   Past Medical History  Diagnosis Date  . Myalgia     Hx of diffuse myalgias, onset in her 41s  . Thickened endometrium 10/16/2011    on CT.  F/u pelvic u/s showed nabothian cyst to account for this..  No enometrial pathology noted on u/s, no ovaries visualized.  Marland Kitchen Hx: UTI (urinary tract infection)     recurrent.  Pyelo hospitalization 2012  . GERD (gastroesophageal reflux disease)   . Microhematuria 02/2012    Cystoscopy with bladder biopsy and cystouretograms--pathology benign.  . Hypertension   . Vertigo   . Degenerative arthritis of cervical spine   . Cataract     right    Past Surgical History  Procedure Laterality Date  . Back surgery  1990s x 2    Dr. Leeanne Deed in [place]  . Cholecystectomy    . Hysteroscopy  11/2004    Cervical hystogram   . Colonoscopy  approx 2002    Diverticulosis, int hemorrhoids ([facility])    Family History  Problem Relation Age of Onset  . Cancer Mother     bladder    History   Social History  . Marital Status: Married    Spouse Name: N/A    Number of Children: N/A  . Years of Education: N/A   Occupational History  . Not on file.   Social History Main Topics  . Smoking status: Never Smoker   . Smokeless tobacco: Never Used  . Alcohol Use: No  . Drug  Use: No  . Sexual Activity:    Other Topics Concern  . Not on file   Social History Narrative   Married, lives with husband in [place].  Has two grown children.   Retired from [employer], then un-retired to go back to same job--full time.   No t/a/d.   No formal exercise.   Normal american diet.   Meds: not taking keflex listed below Outpatient Prescriptions Prior to Visit  Medication Sig Dispense Refill  . aspirin EC 81 MG tablet Take 81 mg by mouth daily.      . DULoxetine (CYMBALTA) 30 MG capsule Take 1 capsule (30 mg total) by mouth daily.  90 capsule  1  . Multiple Vitamin (MULTIVITAMIN) tablet Take 1 tablet by mouth daily.      . cephALEXin (KEFLEX) 250 MG capsule Take 1 capsule (250 mg total) by mouth 4 (four) times daily.  20 capsule  0   No facility-administered medications prior to visit.    Allergies  Allergen Reactions  . Ciprofloxacin Nausea And Vomiting  . Codeine Other (See Comments)    Feels funny  . Celebrex [Celecoxib] Rash  . Penicillins Rash    ROS Review of  Systems  Constitutional: Negative for fever, chills, appetite change and fatigue.  HENT: Negative for congestion, dental problem, ear pain and sore throat.   Eyes: Negative for discharge, redness and visual disturbance.  Respiratory: Negative for cough, chest tightness, shortness of breath and wheezing.   Cardiovascular: Negative for chest pain, palpitations and leg swelling.  Gastrointestinal: Negative for nausea, vomiting, abdominal pain, diarrhea and blood in stool.  Genitourinary: Negative for dysuria, urgency, frequency, hematuria, flank pain and difficulty urinating.  Musculoskeletal: Negative for arthralgias, back pain, joint swelling, myalgias and neck stiffness.  Skin: Negative for pallor and rash.  Neurological: Negative for dizziness, speech difficulty, weakness and headaches.  Hematological: Negative for adenopathy. Does not bruise/bleed easily.  Psychiatric/Behavioral:  Negative for confusion and sleep disturbance. The patient is not nervous/anxious.     PE; Blood pressure 119/80, pulse 74, temperature 98.9 F (37.2 C), temperature source Temporal, resp. rate 18, height 5\' 7"  (1.702 m), weight 177 lb (80.287 kg), SpO2 95.00%. Gen: Alert, well appearing.  Patient is oriented to person, place, time, and situation. AFFECT: pleasant, lucid thought and speech. ENT: Ears: EACs clear, normal epithelium.  TMs with good light reflex and landmarks bilaterally.  Eyes: no injection, icteris, swelling, or exudate.  EOMI, PERRLA. Nose: no drainage or turbinate edema/swelling.  No injection or focal lesion.  Mouth: lips without lesion/swelling.  Oral mucosa pink and moist.  Dentition intact and without obvious caries or gingival swelling.  Oropharynx without erythema, exudate, or swelling.  Neck: supple/nontender.  No LAD, mass, or TM.  Carotid pulses 2+ bilaterally, without bruits. CV: RRR, no m/r/g.   LUNGS: CTA bilat, nonlabored resps, good aeration in all lung fields. ABD: soft, NT, ND, BS normal.  No hepatospenomegaly or mass.  No bruits. EXT: no clubbing, cyanosis, or edema.  Musculoskeletal: no joint swelling, erythema, warmth, or tenderness.  ROM of all joints intact.  No soft tissue tenderness over shoulders or scapulae or neck. Skin - no sores or suspicious lesions or rashes or color changes   Pertinent labs:  Lab Results  Component Value Date   WBC 6.2 08/12/2014   HGB 14.3 08/12/2014   HCT 43.2 08/12/2014   MCV 96.7 08/12/2014   PLT 226.0 08/12/2014   Lab Results  Component Value Date   CHOL 179 08/12/2014   HDL 40.00 08/12/2014   LDLCALC 107* 08/12/2014   LDLDIRECT 140.1 08/17/2012   TRIG 158.0* 08/12/2014   CHOLHDL 4 08/12/2014   Lab Results  Component Value Date   TSH 2.07 08/12/2014     Chemistry      Component Value Date/Time   NA 141 08/12/2014 0808   K 4.4 08/12/2014 0808   CL 103 08/12/2014 0808   CO2 26 08/12/2014 0808   BUN 14  08/12/2014 0808   CREATININE 0.6 08/12/2014 0808      Component Value Date/Time   CALCIUM 9.1 08/12/2014 0808   ALKPHOS 93 08/12/2014 0808   AST 20 08/12/2014 0808   ALT 13 08/12/2014 0808   BILITOT 0.6 08/12/2014 0808       ASSESSMENT AND PLAN:   Fibromyalgia Doing well. Continue cymbalta.  Hyperlipidemia She calculates out to a 10.4% 10 yr CV/CVA risk. Discussed starting statin but decided on at least 1 yr trial of TLC. Discussed exercise requirements necessary for CV health and wt loss, gave DASH diet handout.  Colon cancer screening iFOB given today.   GERD: stable on prn meds only.  Prev health care: Gets cervical and breast ca screening  via her GYN: planned next for 10/2014. Recommended 1500 mg calcium + 800 IU vit D daily for osteoporosis prevention. She has had flu vaccine this season. Prevnar 13 IM today.  Pneumovax at next f/u in 1 yr.  An After Visit Summary was printed and given to the patient.  FOLLOW UP:  Return in about 1 year (around 08/22/2015) for routine chronic illness f/u--fasting.

## 2014-08-21 NOTE — Assessment & Plan Note (Signed)
Doing well.  Continue cymbalta.  

## 2014-08-21 NOTE — Assessment & Plan Note (Signed)
iFOB given today.

## 2014-08-21 NOTE — Progress Notes (Signed)
Pre visit review using our clinic review tool, if applicable. No additional management support is needed unless otherwise documented below in the visit note. 

## 2014-08-26 LAB — FECAL OCCULT BLOOD, IMMUNOCHEMICAL: Fecal Occult Bld: NEGATIVE

## 2014-11-27 ENCOUNTER — Ambulatory Visit (INDEPENDENT_AMBULATORY_CARE_PROVIDER_SITE_OTHER): Payer: Medicare PPO | Admitting: Family Medicine

## 2014-11-27 ENCOUNTER — Encounter: Payer: Self-pay | Admitting: Family Medicine

## 2014-11-27 VITALS — BP 120/79 | HR 72 | Temp 97.7°F | Resp 16 | Ht 67.0 in | Wt 178.0 lb

## 2014-11-27 DIAGNOSIS — K5732 Diverticulitis of large intestine without perforation or abscess without bleeding: Secondary | ICD-10-CM

## 2014-11-27 DIAGNOSIS — R1032 Left lower quadrant pain: Secondary | ICD-10-CM

## 2014-11-27 DIAGNOSIS — R8290 Unspecified abnormal findings in urine: Secondary | ICD-10-CM

## 2014-11-27 LAB — POCT URINALYSIS DIPSTICK
Blood, UA: NEGATIVE
GLUCOSE UA: NEGATIVE
Ketones, UA: NEGATIVE
Nitrite, UA: NEGATIVE
PH UA: 5.5
Protein, UA: NEGATIVE
SPEC GRAV UA: 1.025
Urobilinogen, UA: 1

## 2014-11-27 LAB — POC HEMOCCULT BLD/STL (OFFICE/1-CARD/DIAGNOSTIC): Fecal Occult Blood, POC: NEGATIVE

## 2014-11-27 MED ORDER — SULFAMETHOXAZOLE-TRIMETHOPRIM 800-160 MG PO TABS: 1.0000 | ORAL_TABLET | Freq: Two times a day (BID) | ORAL | Status: DC

## 2014-11-27 MED ORDER — METRONIDAZOLE 500 MG PO TABS: 500.0000 mg | ORAL_TABLET | Freq: Three times a day (TID) | ORAL | Status: DC

## 2014-11-27 NOTE — Progress Notes (Signed)
Pre visit review using our clinic review tool, if applicable. No additional management support is needed unless otherwise documented below in the visit note. 

## 2014-11-27 NOTE — Progress Notes (Signed)
OFFICE VISIT  11/27/2014   CC:  Chief Complaint  Patient presents with  . Abdominal Pain    left sided.    HPI:    Patient is a 74 y.o. Caucasian female who presents for pain in left lower quadrant of abdomen for about the last 2 mo. Sharp, radiates around left side and into left low back.  Intermittent, getting more frequent, usually lasts a few minutes. No associated sx's such as nausea, urinary complaints, or constipation.  Having regular BMs, soft/formed.  No fevers. Says she has never had this kind of pain before.  No vag bleeding or d/c.  Appetite normal.  Has been trying to focus on being well hydrated.  No new meds.    ROS: no night sweats, unexplained fevers, or weight loss.  No gross hematuria, no melena, no hematochezia. No change in stool habits.   Past Medical History  Diagnosis Date  . Myalgia     Hx of diffuse myalgias, onset in her 3s  . Thickened endometrium 10/16/2011    on CT.  F/u pelvic u/s showed nabothian cyst to account for this..  No enometrial pathology noted on u/s, no ovaries visualized.  Marland Kitchen Hx: UTI (urinary tract infection)     recurrent.  Pyelo hospitalization 2012  . GERD (gastroesophageal reflux disease)   . Microhematuria 02/2012    Cystoscopy with bladder biopsy and cystouretograms--pathology benign.  . Hypertension   . Vertigo   . Degenerative arthritis of cervical spine   . Cataract     right    Past Surgical History  Procedure Laterality Date  . Back surgery  1990s x 2    Dr. Leeanne Deed in [place]  . Cholecystectomy    . Hysteroscopy  11/2004    Cervical hystogram   . Colonoscopy  approx 2002    Diverticulosis, int hemorrhoids ([facility])    Outpatient Prescriptions Prior to Visit  Medication Sig Dispense Refill  . aspirin EC 81 MG tablet Take 81 mg by mouth daily.    . DULoxetine (CYMBALTA) 30 MG capsule Take 1 capsule (30 mg total) by mouth daily. 90 capsule 1  . Multiple Vitamin (MULTIVITAMIN) tablet Take 1 tablet by mouth daily.      No facility-administered medications prior to visit.    Allergies  Allergen Reactions  . Ciprofloxacin Nausea And Vomiting  . Codeine Other (See Comments)    Feels funny  . Celebrex [Celecoxib] Rash  . Penicillins Rash    ROS As per HPI  PE: Blood pressure 120/79, pulse 72, temperature 97.7 F (36.5 C), temperature source Temporal, resp. rate 16, height 5\' 7"  (1.702 m), weight 178 lb (80.74 kg), SpO2 97 %. Gen: Alert, well appearing.  Patient is oriented to person, place, time, and situation. GXQ:JJHE: no injection, icteris, swelling, or exudate.  EOMI, PERRLA. Mouth: lips without lesion/swelling.  Oral mucosa pink and moist. Oropharynx without erythema, exudate, or swelling.  Neck - No masses or thyromegaly or limitation in range of motion CV: RRR, no m/r/g.   LUNGS: CTA bilat, nonlabored resps, good aeration in all lung fields. ABD: soft, nondistended, mod LLQ TTP and mild suprapubic TTP.  Minimal discomfort to palpation along left side of torso below rib cage.  No skin rash.  Doing a crunch while I palpated the area of concern did not change the sensation of tenderness. BS normal.  No HSM, mass, or bruit.  No rebound tenderness.  LABS:  POC hemoccult neg in office today CC UA today: normal  except trace LEU  IMPRESSION AND PLAN:  LLQ pain x 2 mo, but seems this was mild and very intermittent to begin with and is now getting more severe/persistent. Will treat empirically for acute diverticulitis since I have no red flags for alternate dx at this time. Bactrim DS 1 bid x 14d, metronidazole 500mg  tid x 14d. If not improved at f/u in 2 wks, will check CBC, CMET, and obtain CT abd/pelv with contrast. She will also need to get another colonoscopy soon, regardless of the outcome of her current pain, as she had her initial screening colonoscopy approximately 14 yrs ago and is in good health and should be screened again.  She expressed understanding/agreement with plan.  An  After Visit Summary was printed and given to the patient.  FOLLOW UP: No Follow-up on file.

## 2014-11-28 LAB — URINE CULTURE: Colony Count: >=100000

## 2014-12-12 ENCOUNTER — Ambulatory Visit: Payer: Medicare PPO | Admitting: Family Medicine

## 2015-01-13 ENCOUNTER — Other Ambulatory Visit: Payer: Self-pay | Admitting: Family Medicine

## 2015-01-13 DIAGNOSIS — Z1231 Encounter for screening mammogram for malignant neoplasm of breast: Secondary | ICD-10-CM

## 2015-01-22 ENCOUNTER — Ambulatory Visit (HOSPITAL_COMMUNITY): Payer: Medicare PPO

## 2015-01-23 ENCOUNTER — Ambulatory Visit (INDEPENDENT_AMBULATORY_CARE_PROVIDER_SITE_OTHER): Payer: Medicare PPO | Admitting: Family Medicine

## 2015-01-23 ENCOUNTER — Encounter: Payer: Self-pay | Admitting: Family Medicine

## 2015-01-23 VITALS — BP 110/70 | HR 75 | Temp 98.5°F | Ht 67.0 in | Wt 178.0 lb

## 2015-01-23 DIAGNOSIS — J069 Acute upper respiratory infection, unspecified: Secondary | ICD-10-CM | POA: Diagnosis not present

## 2015-01-23 MED ORDER — FEXOFENADINE HCL 60 MG PO TABS: 60.0000 mg | ORAL_TABLET | Freq: Two times a day (BID) | ORAL | Status: DC

## 2015-01-23 MED ORDER — FLUTICASONE PROPIONATE 50 MCG/ACT NA SUSP: 2.0000 | Freq: Every day | NASAL | Status: DC

## 2015-01-23 NOTE — Progress Notes (Signed)
OFFICE NOTE  01/23/2015  CC:  Chief Complaint  Patient presents with  . Sinusitis   HPI: Patient is a 74 y.o. Caucasian female who is here for "sinus issues". Onset 4 d/a, nasal congestion/runny nose, head feels congested, some PND and cough. Hurting around ears some.  No fever.  No SOB, wheeze, or chest tightness.  +HA.  Throat feels a bit irritated but not sore.  Nasal cong impairing sleep. OTC "sinus med" x 1 night no help.    Pertinent PMH: Past medical, surgical, social, and family history reviewed and no changes are noted since last office visit.  MEDS:  Duloxetine qd, MVI qd  PE: Blood pressure 110/70, pulse 75, temperature 98.5 F (36.9 C), temperature source Oral, height 5\' 7"  (1.702 m), weight 178 lb (80.74 kg), SpO2 96 %. VS: noted--normal. Gen: alert, NAD, NONTOXIC APPEARING. HEENT: eyes without injection, drainage, or swelling.  Ears: EACs clear, TMs with normal light reflex and landmarks.  Nose: Clear rhinorrhea, with some dried, crusty exudate adherent to mildly injected mucosa.  No purulent d/c.  No paranasal sinus TTP.  No facial swelling.  Throat and mouth without focal lesion.  No pharyngial swelling, erythema, or exudate.   Neck: supple, no LAD.   LUNGS: CTA bilat, nonlabored resps.   CV: RRR, no m/r/g. EXT: no c/c/e SKIN: no rash    IMPRESSION AND PLAN:  Viral URI, no sign of bacterial infection. Trial of mucinex DM or robitussin DM otc as directed on the box. May use OTC nasal saline spray or irrigation solution bid. OTC nonsedating antihistamines prn discussed.  Decongestant use discussed--ok if tolerated in the past w/out side effect and if pt has no hx of HTN.  An After Visit Summary was printed and given to the patient.  FOLLOW UP: prn

## 2015-01-23 NOTE — Patient Instructions (Signed)
Buy OTC generic saline nasal spray and use 2-3 sprays each nostril several times per day.

## 2015-01-23 NOTE — Progress Notes (Signed)
Pre visit review using our clinic review tool, if applicable. No additional management support is needed unless otherwise documented below in the visit note. 

## 2015-01-27 ENCOUNTER — Ambulatory Visit (HOSPITAL_COMMUNITY)
Admission: RE | Admit: 2015-01-27 | Discharge: 2015-01-27 | Disposition: A | Discharge status: Home / Self Care | Payer: Medicare PPO | Source: Ambulatory Visit | Attending: Family Medicine | Admitting: Family Medicine

## 2015-01-27 DIAGNOSIS — Z1231 Encounter for screening mammogram for malignant neoplasm of breast: Secondary | ICD-10-CM | POA: Diagnosis not present

## 2015-01-30 ENCOUNTER — Encounter: Payer: Self-pay | Admitting: Family Medicine

## 2015-01-30 ENCOUNTER — Ambulatory Visit (INDEPENDENT_AMBULATORY_CARE_PROVIDER_SITE_OTHER): Payer: Medicare PPO | Admitting: Family Medicine

## 2015-01-30 VITALS — BP 120/70 | HR 71 | Temp 98.0°F | Ht 67.0 in | Wt 178.0 lb

## 2015-01-30 DIAGNOSIS — Z87440 Personal history of urinary (tract) infections: Secondary | ICD-10-CM

## 2015-01-30 DIAGNOSIS — N3090 Cystitis, unspecified without hematuria: Secondary | ICD-10-CM

## 2015-01-30 DIAGNOSIS — Z87448 Personal history of other diseases of urinary system: Secondary | ICD-10-CM

## 2015-01-30 MED ORDER — CEFDINIR 300 MG PO CAPS: 300.0000 mg | ORAL_CAPSULE | Freq: Two times a day (BID) | ORAL | Status: DC

## 2015-01-30 NOTE — Progress Notes (Signed)
Pre visit review using our clinic review tool, if applicable. No additional management support is needed unless otherwise documented below in the visit note. 

## 2015-01-30 NOTE — Progress Notes (Signed)
OFFICE NOTE  01/30/2015  CC: Dysuria  HPI: Patient is a 74 y.o. Caucasian female who is here for onset of dysuria 2d/a. +urgency and frequency, no gross hematuria.  Diffuse LB pain and "headache, which I always get when I get a bladder infection".  Slight vag d/c yesterday and today, no vag itching.  +Urine with foul odor last couple of days.  Mild suprapubic area discomfort.  No n/v or fevers. The URI sx's I saw her for on 01/23/15 has essentially resolved. Took AZO x 2 doses, most recently yesterday morning.  Pertinent PMH:  +Hx of recurrent UTI, hx of pyelo hospitalization 2012. Microhematuria w/u 2013 NEG  MEDS:  Outpatient Prescriptions Prior to Visit  Medication Sig Dispense Refill  . aspirin EC 81 MG tablet Take 81 mg by mouth daily.    . DULoxetine (CYMBALTA) 30 MG capsule Take 1 capsule (30 mg total) by mouth daily. 90 capsule 1  . fexofenadine (ALLEGRA) 60 MG tablet Take 1 tablet (60 mg total) by mouth 2 (two) times daily. 60 tablet 6  . fluticasone (FLONASE) 50 MCG/ACT nasal spray Place 2 sprays into both nostrils daily. 16 g 6  . Multiple Vitamin (MULTIVITAMIN) tablet Take 1 tablet by mouth daily.     No facility-administered medications prior to visit.    PE: Blood pressure 120/70, pulse 71, temperature 98 F (36.7 C), temperature source Oral, height 5\' 7"  (1.702 m), weight 178 lb (80.74 kg), SpO2 96 %. Gen: Alert, well appearing.  Patient is oriented to person, place, time, and situation. CV: RRR, no m/r/g.   LUNGS: CTA bilat, nonlabored resps, good aeration in all lung fields. CVA region: percussion in this region on each side elicits some LB pain but no tenderness in the actual CVA on either side.  Mild lower abd/suprapubic TTP w/out guarding or rebound. EXT: no clubbing, cyanosis, or edema.   LAB: CC UA today: SG >1.030, trace intact blood, trace LEU, otherwise normal.  IMPRESSION AND PLAN:  Acute lower UTI, no definite objective signs of pyelonephritis. Hx of  recurrent UTI's. Rx'd omnicef generic 300mg  bid x 5d, sent urine for c/s. Discussed small chance of cephalosporin allergy in pt's with even a mild prior allergic rxn to penicillins and pt and I agreed her risk was low and use of this med was appropriate at this time given her multiple antibiotic allergies/intolerances).  An After Visit Summary was printed and given to the patient.  FOLLOW UP: prn

## 2015-01-31 LAB — POCT URINALYSIS DIPSTICK
Bilirubin, UA: NEGATIVE
GLUCOSE UA: NEGATIVE
Ketones, UA: NEGATIVE
Nitrite, UA: NEGATIVE
PH UA: 5.5
Protein, UA: NEGATIVE
Spec Grav, UA: >=1.03
UROBILINOGEN UA: 0.2

## 2015-02-01 LAB — URINE CULTURE: Colony Count: 50000

## 2015-02-05 ENCOUNTER — Telehealth: Payer: Self-pay | Admitting: Family Medicine

## 2015-02-05 NOTE — Telephone Encounter (Signed)
I cannot view the mammogram result in EPIC for some reason.  It was done at [facility] radiology.  Pls request her mammogram results to be sent to me.  Then we can get back to the pt with result. -thx

## 2015-02-05 NOTE — Telephone Encounter (Signed)
Patient also wanted to know if you received her MM results and want the results were?

## 2015-02-05 NOTE — Telephone Encounter (Signed)
Liza, Czerwinski returned your call does she need to come back or get a RX.

## 2015-02-07 NOTE — Telephone Encounter (Signed)
Done

## 2015-02-11 DIAGNOSIS — H2512 Age-related nuclear cataract, left eye: Secondary | ICD-10-CM | POA: Diagnosis not present

## 2015-02-11 DIAGNOSIS — Z961 Presence of intraocular lens: Secondary | ICD-10-CM | POA: Diagnosis not present

## 2015-03-14 ENCOUNTER — Ambulatory Visit (INDEPENDENT_AMBULATORY_CARE_PROVIDER_SITE_OTHER): Payer: Medicare PPO | Admitting: Family Medicine

## 2015-03-14 ENCOUNTER — Encounter: Payer: Self-pay | Admitting: Family Medicine

## 2015-03-14 VITALS — BP 115/77 | HR 77 | Temp 97.4°F | Ht 67.0 in | Wt 177.0 lb

## 2015-03-14 DIAGNOSIS — J209 Acute bronchitis, unspecified: Secondary | ICD-10-CM | POA: Diagnosis not present

## 2015-03-14 DIAGNOSIS — J069 Acute upper respiratory infection, unspecified: Secondary | ICD-10-CM | POA: Diagnosis not present

## 2015-03-14 DIAGNOSIS — J040 Acute laryngitis: Secondary | ICD-10-CM

## 2015-03-14 MED ORDER — IPRATROPIUM BROMIDE 0.02 % IN SOLN
0.5000 mg | Freq: Once | RESPIRATORY_TRACT | Status: AC
  Administered 2015-03-14: 0.5 mg via RESPIRATORY_TRACT

## 2015-03-14 MED ORDER — ALBUTEROL SULFATE HFA 108 (90 BASE) MCG/ACT IN AERS: 1.0000 | INHALATION_SPRAY | RESPIRATORY_TRACT | Status: DC | PRN

## 2015-03-14 MED ORDER — PREDNISONE 20 MG PO TABS: ORAL_TABLET | ORAL | Status: DC

## 2015-03-14 MED ORDER — HYDROCODONE-HOMATROPINE 5-1.5 MG/5ML PO SYRP: ORAL_SOLUTION | ORAL | Status: DC

## 2015-03-14 NOTE — Progress Notes (Signed)
OFFICE NOTE  03/14/2015  CC:  Chief Complaint  Patient presents with  . Cough   HPI: Patient is a 74 y.o. Caucasian female who is here for cough, hoarse voice. Onset 6 days ago, woke up with bad ST, coughing a lot, malaise, voice went away but is back now, coughing hurts her in chest.  No known fevers.  Denies SOB, chest tightness, or wheezing. ST has improved some.  Feels tickle.  Runny nose/stuffy nose.  No persistent pain in face/upper teeth. Some HA for 2-3 d, gone now.     Pertinent PMH:  No chronic lung dz. Nonsmoker  MEDS: Not taking cefdinir listed below Outpatient Prescriptions Prior to Visit  Medication Sig Dispense Refill  . aspirin EC 81 MG tablet Take 81 mg by mouth daily.    . DULoxetine (CYMBALTA) 30 MG capsule Take 1 capsule (30 mg total) by mouth daily. 90 capsule 1  . fexofenadine (ALLEGRA) 60 MG tablet Take 1 tablet (60 mg total) by mouth 2 (two) times daily. 60 tablet 6  . fluticasone (FLONASE) 50 MCG/ACT nasal spray Place 2 sprays into both nostrils daily. 16 g 6  . Multiple Vitamin (MULTIVITAMIN) tablet Take 1 tablet by mouth daily.    . cefdinir (OMNICEF) 300 MG capsule Take 1 capsule (300 mg total) by mouth 2 (two) times daily. (Patient not taking: Reported on 03/14/2015) 10 capsule 0   No facility-administered medications prior to visit.    PE: Blood pressure 115/77, pulse 77, temperature 97.4 F (36.3 C), temperature source Oral, height 5\' 7"  (1.702 m), weight 177 lb (80.287 kg), SpO2 96 %. VS: noted--normal. Gen: alert, NAD, NONTOXIC APPEARING.  Mild hoarseness of voice noted. HEENT: eyes without injection, drainage, or swelling.  Ears: EACs clear, TMs with normal light reflex and landmarks.  Nose: Clear rhinorrhea, with some dried, crusty exudate adherent to mildly injected mucosa.  No purulent d/c.  No paranasal sinus TTP.  No facial swelling.  Throat and mouth without focal lesion.  No pharyngial swelling, erythema, or exudate.   Neck: supple, no  LAD.   LUNGS: CTA bilat, nonlabored resps.  She does have more tendency to cough with forced expiratory maneuver but no wheezing.  Mildly prolonged exp phase. CV: RRR, no m/r/g. EXT: no c/c/e SKIN: no rash    IMPRESSION AND PLAN:  URI, laryngitis, acute bronchitis--suspect viral etiology. Alb/atr neb given in office today: still clear, good aeration, no coughing with forced exp maneuver now. Prednisone 40mg  qd x 5d, then 20mg  qd x 5d. Hycodan syrup, 1 tsp po qhs prn, #120 ml. Robitussin DM daytime. Continue nasal sprays. Hydrate, rest.  An After Visit Summary was printed and given to the patient.  FOLLOW UP: prn

## 2015-03-14 NOTE — Progress Notes (Signed)
Pre visit review using our clinic review tool, if applicable. No additional management support is needed unless otherwise documented below in the visit note. 

## 2015-04-02 ENCOUNTER — Ambulatory Visit: Payer: Medicare PPO | Admitting: Family Medicine

## 2015-04-02 ENCOUNTER — Encounter: Payer: Self-pay | Admitting: Family Medicine

## 2015-04-02 ENCOUNTER — Ambulatory Visit (INDEPENDENT_AMBULATORY_CARE_PROVIDER_SITE_OTHER): Payer: Medicare PPO | Admitting: Family Medicine

## 2015-04-02 ENCOUNTER — Encounter: Payer: Self-pay | Admitting: *Deleted

## 2015-04-02 VITALS — BP 130/83 | HR 69 | Temp 98.2°F | Resp 16 | Wt 179.0 lb

## 2015-04-02 DIAGNOSIS — H8111 Benign paroxysmal vertigo, right ear: Secondary | ICD-10-CM | POA: Diagnosis not present

## 2015-04-02 MED ORDER — MECLIZINE HCL 25 MG PO TABS: 25.0000 mg | ORAL_TABLET | Freq: Three times a day (TID) | ORAL | Status: DC | PRN

## 2015-04-02 NOTE — Progress Notes (Signed)
OFFICE NOTE  04/02/2015  CC:  Chief Complaint  Patient presents with  . Dizziness    x 1 week, fell at work   HPI: Patient is a 74 y.o. Caucasian female who is here for dizziness. Onset 6 days ago upon getting out of bed in the morning, room spinning, nausea, episodes last 10-20 sec and if she stays still she feels a sense of disequilibrium only.  No HA.  Since that time she has had recurrence of the vertigo every time she turns her head to the right.  No significant wobbly gait or ataxia.  No focal weakness.  No hearing impairment or ringing in ears.   Pertinent PMH:  Past medical, surgical, social, and family history reviewed and no changes are noted since last office visit. +Hx of vertigo  MEDS: Not taking prednisone or hycodan listed below Outpatient Prescriptions Prior to Visit  Medication Sig Dispense Refill  . albuterol (VENTOLIN HFA) 108 (90 BASE) MCG/ACT inhaler Inhale 1-2 puffs into the lungs every 4 (four) hours as needed for wheezing or shortness of breath. 1 Inhaler 0  . aspirin EC 81 MG tablet Take 81 mg by mouth daily.    . DULoxetine (CYMBALTA) 30 MG capsule Take 1 capsule (30 mg total) by mouth daily. 90 capsule 1  . fexofenadine (ALLEGRA) 60 MG tablet Take 1 tablet (60 mg total) by mouth 2 (two) times daily. 60 tablet 6  . fluticasone (FLONASE) 50 MCG/ACT nasal spray Place 2 sprays into both nostrils daily. 16 g 6  . Multiple Vitamin (MULTIVITAMIN) tablet Take 1 tablet by mouth daily.    Marland Kitchen HYDROcodone-homatropine (HYCODAN) 5-1.5 MG/5ML syrup 1 teaspoon po qhs prn (Patient not taking: Reported on 04/02/2015) 120 mL 0  . predniSONE (DELTASONE) 20 MG tablet 2 tab po qd x 5 days, then 1 tab po qd x 5 days (Patient not taking: Reported on 04/02/2015) 15 tablet 0   No facility-administered medications prior to visit.    PE: Blood pressure 130/83, pulse 69, temperature 98.2 F (36.8 C), temperature source Oral, resp. rate 16, weight 179 lb (81.194 kg), SpO2 95 %. Gen: Alert,  well appearing.  Patient is oriented to person, place, time, and situation. CV: RRR, no m/r/g.   LUNGS: CTA bilat, nonlabored resps, good aeration in all lung fields. Neuro: CN 2-12 intact bilaterally, strength 5/5 in proximal and distal upper extremities and lower extremities bilaterally.  No sensory deficits.  No tremor.   Dix-Halpike maneuver in office: with head turn to R she had vertigo with classic rotary nystagmus.  This resolved after about 10 seconds. Head turn to L did not elicit any symptoms or nystagmus.   IMPRESSION AND PLAN:  BPPV, right. Discussed dx. Home Epley's maneuvers reviewed/discussed, gave pt and husband a handout about these maneuvers. Meclizine 25mg  tid prn for disequilibrium sensation between episodes.  Spent 25 min with pt today, with >50% of this time spent in counseling and care coordination regarding the above problems.  An After Visit Summary was printed and given to the patient.  FOLLOW UP: prn

## 2015-04-02 NOTE — Progress Notes (Signed)
Pre visit review using our clinic review tool, if applicable. No additional management support is needed unless otherwise documented below in the visit note. 

## 2015-05-01 ENCOUNTER — Encounter: Payer: Self-pay | Admitting: Family Medicine

## 2015-05-01 ENCOUNTER — Ambulatory Visit (INDEPENDENT_AMBULATORY_CARE_PROVIDER_SITE_OTHER): Payer: Medicare PPO | Admitting: Family Medicine

## 2015-05-01 VITALS — BP 110/75 | HR 73 | Temp 98.3°F | Resp 16 | Ht 67.0 in | Wt 178.0 lb

## 2015-05-01 DIAGNOSIS — R5382 Chronic fatigue, unspecified: Secondary | ICD-10-CM

## 2015-05-01 DIAGNOSIS — R21 Rash and other nonspecific skin eruption: Secondary | ICD-10-CM | POA: Diagnosis not present

## 2015-05-01 MED ORDER — FLUTICASONE PROPIONATE 0.05 % EX CREA: TOPICAL_CREAM | CUTANEOUS | Status: DC

## 2015-05-01 NOTE — Progress Notes (Signed)
OFFICE NOTE  05/01/2015  CC:  Chief Complaint  Patient presents with  . Rash    x 3 days     HPI: Patient is a 74 y.o. Caucasian female who is here for rash on both arms, last 3d, only slightly itchy.  No pain.  No known contact irritant or allergen exposure. No f/c/malaise.  She has not applied anything to it.  No new meds recently.  She complains again of chronic fatigue, without any chronic myofascial pain.  No swelling joints, no unexplained fevers.  Pertinent PMH:  Past medical, surgical, social, and family history reviewed and no changes are noted since last office visit.  MEDS:  Outpatient Prescriptions Prior to Visit  Medication Sig Dispense Refill  . albuterol (VENTOLIN HFA) 108 (90 BASE) MCG/ACT inhaler Inhale 1-2 puffs into the lungs every 4 (four) hours as needed for wheezing or shortness of breath. 1 Inhaler 0  . aspirin EC 81 MG tablet Take 81 mg by mouth daily.    . DULoxetine (CYMBALTA) 30 MG capsule Take 1 capsule (30 mg total) by mouth daily. 90 capsule 1  . fexofenadine (ALLEGRA) 60 MG tablet Take 1 tablet (60 mg total) by mouth 2 (two) times daily. 60 tablet 6  . fluticasone (FLONASE) 50 MCG/ACT nasal spray Place 2 sprays into both nostrils daily. 16 g 6  . meclizine (ANTIVERT) 25 MG tablet Take 1 tablet (25 mg total) by mouth 3 (three) times daily as needed for dizziness. 30 tablet 1  . Multiple Vitamin (MULTIVITAMIN) tablet Take 1 tablet by mouth daily.     No facility-administered medications prior to visit.    PE: Blood pressure 110/75, pulse 73, temperature 98.3 F (36.8 C), temperature source Oral, resp. rate 16, height 5\' 7"  (1.702 m), weight 178 lb (80.74 kg), SpO2 96 %. Gen: Alert, well appearing.  Patient is oriented to person, place, time, and situation. SKIN: scattered pink papular rash on volar surface of R arm and extensor surface of L arm.  No vesicles, no pustules, no petechiae. Nontender.  No streaking.  IMPRESSION AND PLAN:  1) Rash,  nonspecific dermatitis-type appearance. Cutivate 0.05% cream.   2) Chronic fatigue; suspect nonpathologic : check CBC, CMET, TSH.  An After Visit Summary was printed and given to the patient.  FOLLOW UP: prn

## 2015-05-01 NOTE — Progress Notes (Signed)
Pre visit review using our clinic review tool, if applicable. No additional management support is needed unless otherwise documented below in the visit note. 

## 2015-05-02 LAB — COMPREHENSIVE METABOLIC PANEL
ALBUMIN: 3.9 g/dL (ref 3.5–5.2)
ALT: 13 U/L (ref 0–35)
AST: 19 U/L (ref 0–37)
Alkaline Phosphatase: 88 U/L (ref 39–117)
BUN: 13 mg/dL (ref 6–23)
CHLORIDE: 105 meq/L (ref 96–112)
CO2: 30 meq/L (ref 19–32)
Calcium: 9.4 mg/dL (ref 8.4–10.5)
Creatinine, Ser: 0.71 mg/dL (ref 0.40–1.20)
GFR: 85.7 mL/min (ref >60.00)
GLUCOSE: 91 mg/dL (ref 70–99)
POTASSIUM: 4.1 meq/L (ref 3.5–5.1)
Sodium: 141 mEq/L (ref 135–145)
Total Bilirubin: 0.6 mg/dL (ref 0.2–1.2)
Total Protein: 6.9 g/dL (ref 6.0–8.3)

## 2015-05-02 LAB — CBC
HCT: 41.7 % (ref 36.0–46.0)
HEMOGLOBIN: 13.8 g/dL (ref 12.0–15.0)
MCHC: 33.2 g/dL (ref 30.0–36.0)
MCV: 96.4 fl (ref 78.0–100.0)
PLATELETS: 229 10*3/uL (ref 150.0–400.0)
RBC: 4.33 Mil/uL (ref 3.87–5.11)
RDW: 13.5 % (ref 11.5–15.5)
WBC: 8 10*3/uL (ref 4.0–10.5)

## 2015-05-02 LAB — TSH: TSH: 2.15 u[IU]/mL (ref 0.35–4.50)

## 2015-05-07 ENCOUNTER — Encounter: Payer: Self-pay | Admitting: Family Medicine

## 2015-07-24 ENCOUNTER — Ambulatory Visit: Payer: Medicare PPO

## 2015-07-31 ENCOUNTER — Ambulatory Visit (INDEPENDENT_AMBULATORY_CARE_PROVIDER_SITE_OTHER): Payer: Medicare PPO

## 2015-07-31 DIAGNOSIS — Z23 Encounter for immunization: Secondary | ICD-10-CM | POA: Diagnosis not present

## 2015-09-23 IMAGING — CR DG CERVICAL SPINE COMPLETE 4+V
5 series · 5 of 5 positions shown · non-contrast
Comparison: Cervical MRI, 12/18/2007

CLINICAL DATA: Left neck and shoulder pain.

EXAM:
CERVICAL SPINE  4+ VIEWS

[view not recorded (1 of 5)]
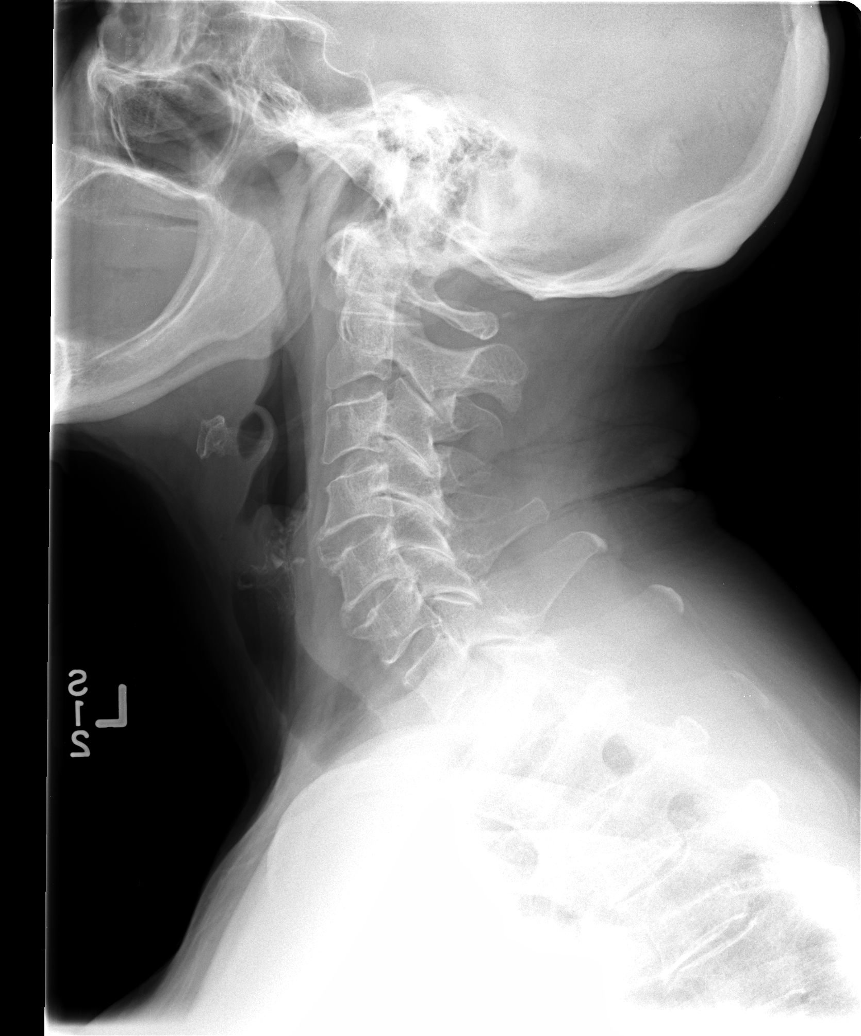

[view not recorded (2 of 5)]
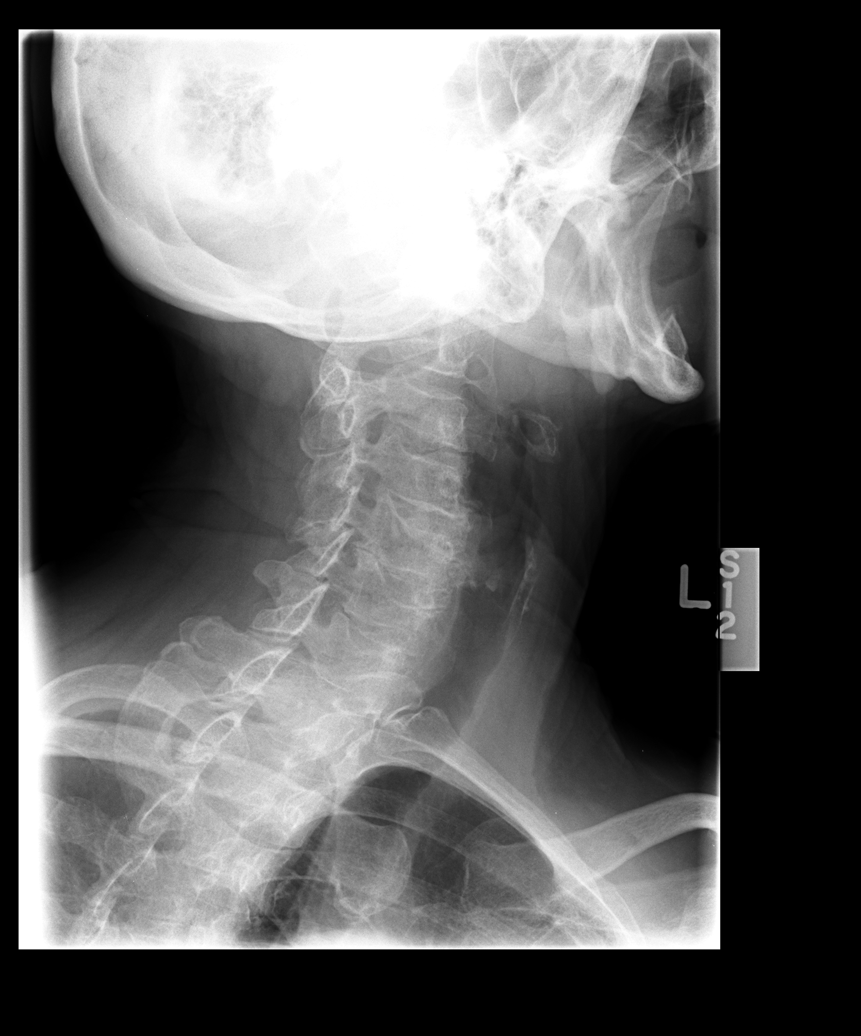

[view not recorded (3 of 5)]
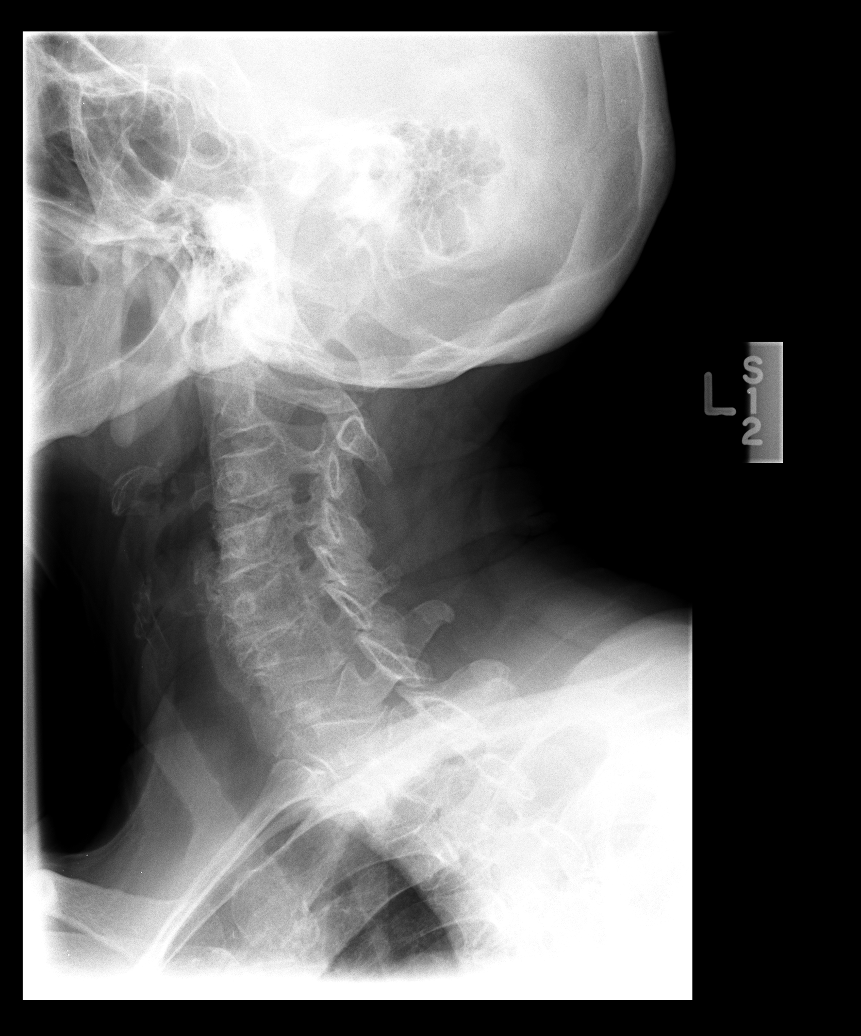

[view not recorded (4 of 5)]
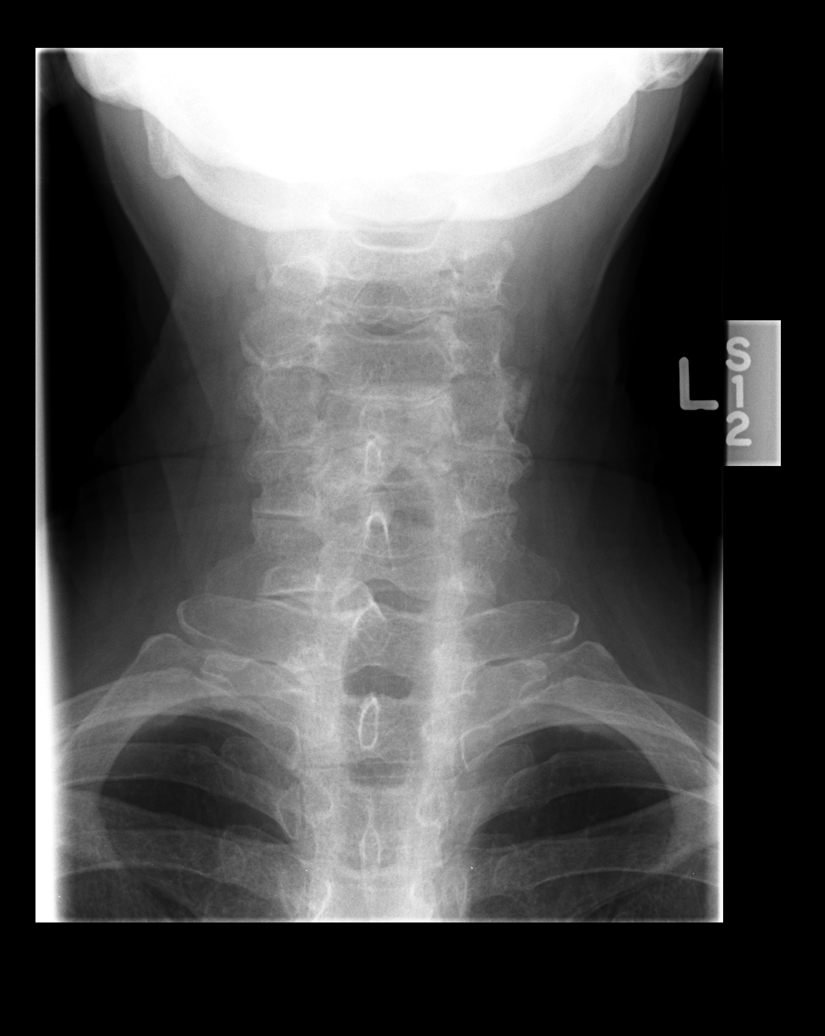

[view not recorded (5 of 5)]
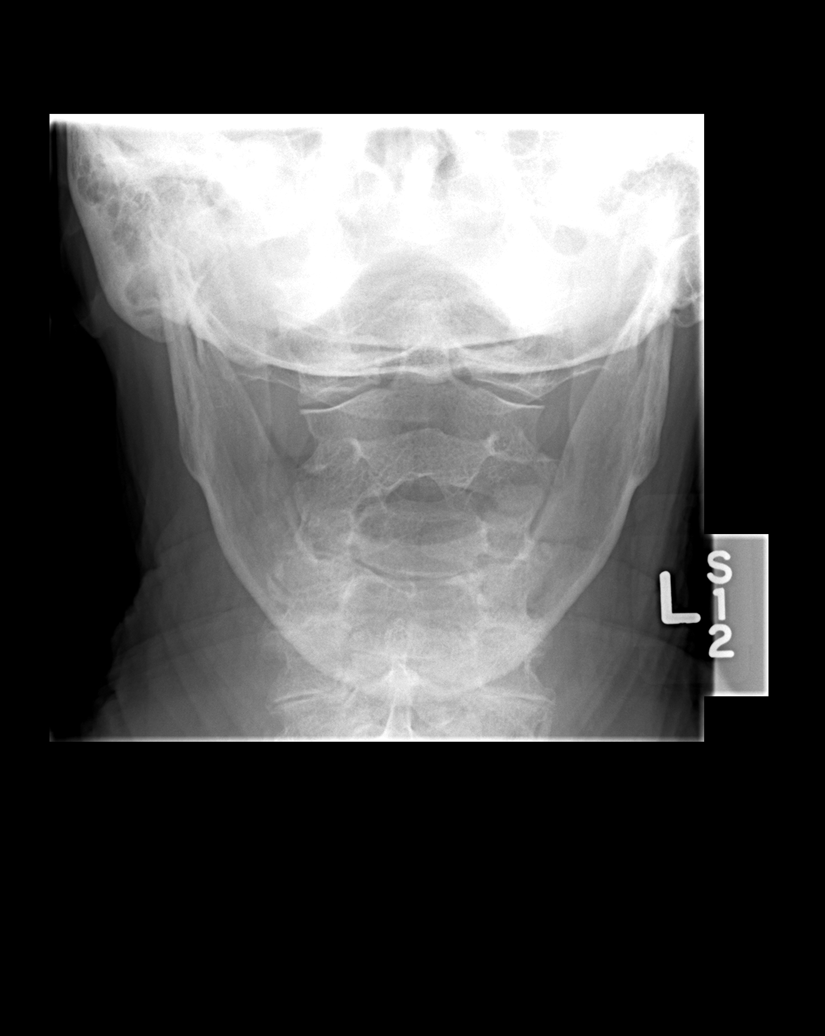

[5 of 5 positions shown; findings below may reference images not displayed]

FINDINGS: No fracture. No spondylolisthesis. Mild loss of disc height at
C5-C6. Remaining cervical disc spaces are well preserved. Small
endplate osteophytes are noted from C4 through C6. There is facet
spurring bilaterally most evident in the mid cervical spine.
Uncovertebral and facet spurring leads to neural foraminal
narrowing, moderate on the left at C4-C5 and mild on the left at
C3-C4, C5-C6 and C6-C7. On the right, there is moderate neural
foraminal narrowing mostly from uncovertebral spurring at C5-C6.

Bones are demineralized.  Soft tissues are unremarkable.
IMPRESSION: No fracture or acute finding. Degenerative changes with Mild to
moderate degrees of neural foraminal narrowing as detailed above.

## 2015-10-07 ENCOUNTER — Ambulatory Visit (INDEPENDENT_AMBULATORY_CARE_PROVIDER_SITE_OTHER): Payer: Medicare PPO | Admitting: Family Medicine

## 2015-10-07 ENCOUNTER — Encounter: Payer: Self-pay | Admitting: Family Medicine

## 2015-10-07 VITALS — BP 133/86 | HR 80 | Temp 98.0°F | Resp 20 | Wt 177.0 lb

## 2015-10-07 DIAGNOSIS — J320 Chronic maxillary sinusitis: Secondary | ICD-10-CM | POA: Insufficient documentation

## 2015-10-07 DIAGNOSIS — J0100 Acute maxillary sinusitis, unspecified: Secondary | ICD-10-CM

## 2015-10-07 MED ORDER — GUAIFENESIN ER 600 MG PO TB12: 600.0000 mg | ORAL_TABLET | Freq: Two times a day (BID) | ORAL | Status: DC

## 2015-10-07 MED ORDER — PREDNISONE 20 MG PO TABS: ORAL_TABLET | ORAL | Status: DC

## 2015-10-07 MED ORDER — DOXYCYCLINE HYCLATE 100 MG PO TABS: 100.0000 mg | ORAL_TABLET | Freq: Two times a day (BID) | ORAL | Status: DC

## 2015-10-07 NOTE — Patient Instructions (Signed)
Doxcycline, prednisone, flonase, mucinex. Nasal saline, Hydrate, rest.  Pneumonia shot (23) needed, nurse visit after acute illness.

## 2015-10-07 NOTE — Progress Notes (Signed)
Subjective:    Patient ID: Karen Delacruz, female    DOB: [birth year], 74 y.o.   MRN: [id number]  HPI   Cold symptoms: Patient presents with a five-day history of rhinorrhea, nasal congestion, sneezing, headache, decreased appetite, nausea and facial pressure. Patient denies fever, chills or vomiting. Patient states she had to miss work yesterday, but she attempt to going to work today but had to leave 11 AM secondary to feeling fatigued. Patient is up-to-date with Tdap, Prevnar and flu. PPSV23 indicated. Patient has tried using Alka-Seltzer cold and sinus and Flonase for her symptoms. Patient has a new grandbaby due to be born within the next few weeks, and she has concerns of improving before then. No history of COPD or asthma.   Past Medical History  Diagnosis Date  . Myalgia     Hx of diffuse myalgias, onset in her 90s  . Thickened endometrium 10/16/2011    on CT.  F/u pelvic u/s showed nabothian cyst to account for this..  No enometrial pathology noted on u/s, no ovaries visualized.  Marland Kitchen Hx: UTI (urinary tract infection)     recurrent.  Pyelo hospitalization 2012  . GERD (gastroesophageal reflux disease)   . Microhematuria 02/2012    Cystoscopy with bladder biopsy and cystouretograms--pathology benign.  . Hypertension   . Vertigo   . Degenerative arthritis of cervical spine   . Cataract     right  . Anxiety    Allergies  Allergen Reactions  . Ciprofloxacin Nausea And Vomiting  . Codeine Other (See Comments)    Feels funny  . Celebrex [Celecoxib] Rash  . Penicillins Rash   Social History   Social History  . Marital Status: Married    Spouse Name: N/A  . Number of Children: N/A  . Years of Education: N/A   Occupational History  . Not on file.   Social History Main Topics  . Smoking status: Never Smoker   . Smokeless tobacco: Never Used  . Alcohol Use: No  . Drug Use: No  . Sexual Activity: Not on file   Other Topics Concern  . Not on file   Social History  Narrative   Married, lives with husband in [place].  Has two grown children.   Retired from [employer], then un-retired to go back to same job--full time.   No t/a/d.   No formal exercise.   Normal american diet.     Review of Systems Negative, with the exception of above mentioned in HPI     Objective:   Physical Exam BP 133/86 mmHg  Pulse 80  Temp(Src) 98 F (36.7 C) (Oral)  Resp 20  Wt 177 lb (80.287 kg)  SpO2 96% Gen: Afebrile. No acute distress. Nontoxic in appearance, well-developed, well-nourished, Caucasian female, very pleasant and appears fatigued. HENT: AT. . Bilateral TM visualized shiny/full. MMM, no oral lesions. Bilateral nares with erythema and swelling. Throat without erythema or exudates. Cough present on exam. Mild hoarseness present on exam. Tender to palpation left greater than right maxillary sinus. Eyes:Pupils Equal Round Reactive to light, Extraocular movements intact,  Conjunctiva without redness, discharge or icterus. Neck/lymp/endocrine: Supple, bilateral cervical lymphadenopathy CV: RRR Chest: CTAB, no wheeze or crackles Abd: Soft. . NTND. BS present.  Skin: No rashes, purpura or petechiae.  Neuro:  Normal gait. PERLA. EOMi. Alert. Oriented x3   Assessment & Plan:  1. Acute maxillary sinusitis, recurrence not specified - Patient with signs and symptoms of maxillary sinusitis today on exam. Encouraged Mucinex,  rest, hydrate, continue Flonase, continue nasal saline, doxycycline and prednisone prescribed. - doxycycline (VIBRA-TABS) 100 MG tablet; Take 1 tablet (100 mg total) by mouth 2 (two) times daily.  Dispense: 20 tablet; Refill: 0 - guaiFENesin (MUCINEX) 600 MG 12 hr tablet; Take 1 tablet (600 mg total) by mouth 2 (two) times daily.  Dispense: 30 tablet; Refill: 0 - Follow-up one week if no improvement, patient to make nurse visit to have PPSV 23 completed after feeling improved.

## 2015-11-26 DIAGNOSIS — E538 Deficiency of other specified B group vitamins: Secondary | ICD-10-CM

## 2015-11-26 HISTORY — DX: Deficiency of other specified B group vitamins: E53.8

## 2015-12-01 ENCOUNTER — Other Ambulatory Visit: Payer: Self-pay | Admitting: Family Medicine

## 2015-12-01 MED ORDER — DULOXETINE HCL 30 MG PO CPEP: 30.0000 mg | ORAL_CAPSULE | Freq: Every day | ORAL | Status: DC

## 2015-12-19 ENCOUNTER — Ambulatory Visit (INDEPENDENT_AMBULATORY_CARE_PROVIDER_SITE_OTHER): Payer: Medicare Other | Admitting: Family Medicine

## 2015-12-19 ENCOUNTER — Encounter: Payer: Self-pay | Admitting: Family Medicine

## 2015-12-19 VITALS — BP 120/85 | HR 70 | Temp 98.2°F | Resp 16 | Ht 67.0 in | Wt 178.5 lb

## 2015-12-19 DIAGNOSIS — E538 Deficiency of other specified B group vitamins: Secondary | ICD-10-CM | POA: Diagnosis not present

## 2015-12-19 DIAGNOSIS — R209 Unspecified disturbances of skin sensation: Secondary | ICD-10-CM

## 2015-12-19 DIAGNOSIS — I872 Venous insufficiency (chronic) (peripheral): Secondary | ICD-10-CM

## 2015-12-19 DIAGNOSIS — Z23 Encounter for immunization: Secondary | ICD-10-CM | POA: Diagnosis not present

## 2015-12-19 DIAGNOSIS — IMO0001 Reserved for inherently not codable concepts without codable children: Secondary | ICD-10-CM

## 2015-12-19 LAB — CBC WITH DIFFERENTIAL/PLATELET
Basophils Absolute: 0.1 10*3/uL (ref 0.0–0.1)
Basophils Relative: 0.8 % (ref 0.0–3.0)
EOS PCT: 5.9 % — AB (ref 0.0–5.0)
Eosinophils Absolute: 0.4 10*3/uL (ref 0.0–0.7)
HCT: 40 % (ref 36.0–46.0)
Hemoglobin: 13.6 g/dL (ref 12.0–15.0)
Lymphocytes Relative: 39.7 % (ref 12.0–46.0)
Lymphs Abs: 2.7 10*3/uL (ref 0.7–4.0)
MCHC: 33.9 g/dL (ref 30.0–36.0)
MCV: 94.1 fl (ref 78.0–100.0)
MONOS PCT: 9.2 % (ref 3.0–12.0)
Monocytes Absolute: 0.6 10*3/uL (ref 0.1–1.0)
NEUTROS ABS: 3.1 10*3/uL (ref 1.4–7.7)
Neutrophils Relative %: 44.4 % (ref 43.0–77.0)
Platelets: 226 10*3/uL (ref 150.0–400.0)
RBC: 4.26 Mil/uL (ref 3.87–5.11)
RDW: 13.4 % (ref 11.5–15.5)
WBC: 6.9 10*3/uL (ref 4.0–10.5)

## 2015-12-19 LAB — COMPREHENSIVE METABOLIC PANEL
ALBUMIN: 4.2 g/dL (ref 3.5–5.2)
ALT: 13 U/L (ref 0–35)
AST: 20 U/L (ref 0–37)
Alkaline Phosphatase: 87 U/L (ref 39–117)
BUN: 12 mg/dL (ref 6–23)
CHLORIDE: 104 meq/L (ref 96–112)
CO2: 31 meq/L (ref 19–32)
Calcium: 9.2 mg/dL (ref 8.4–10.5)
Creatinine, Ser: 0.63 mg/dL (ref 0.40–1.20)
GFR: 98.2 mL/min (ref >60.00)
GLUCOSE: 98 mg/dL (ref 70–99)
POTASSIUM: 4 meq/L (ref 3.5–5.1)
Sodium: 140 mEq/L (ref 135–145)
TOTAL PROTEIN: 7.1 g/dL (ref 6.0–8.3)
Total Bilirubin: 0.7 mg/dL (ref 0.2–1.2)

## 2015-12-19 LAB — C-REACTIVE PROTEIN: CRP: 0.6 mg/dL (ref 0.5–20.0)

## 2015-12-19 LAB — SEDIMENTATION RATE: Sed Rate: 21 mm/hr (ref 0–22)

## 2015-12-19 LAB — HEMOGLOBIN A1C: Hgb A1c MFr Bld: 5.7 % (ref 4.6–6.5)

## 2015-12-19 LAB — TSH: TSH: 1.09 u[IU]/mL (ref 0.35–4.50)

## 2015-12-19 LAB — VITAMIN B12: Vitamin B-12: 72 pg/mL — ABNORMAL LOW (ref 211–911)

## 2015-12-19 NOTE — Progress Notes (Signed)
Pre visit review using our clinic review tool, if applicable. No additional management support is needed unless otherwise documented below in the visit note. 

## 2015-12-19 NOTE — Progress Notes (Signed)
OFFICE VISIT  12/20/2015   CC:  Chief Complaint  Patient presents with  . Numbness    both legs x 4 years started getting worse x 3 months   HPI:    Patient is a 75 y.o. Caucasian female who presents for approx 4 yr hx of mild lower leg swelling bilat, says venous doppler in the past documented "broken valves" in veins, L leg worse than R up until the last year or so. As things have been getting worse with swelling, her LL's feel numb and she finds her balance is off. She has never been rx'd a diuretic, has not been monitoring Na intake.  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   Ths numbness/tingling feeling starts about mid tibia level and goes down into feet bilat.  This abnormal sensation seems over and above what would be expected for a little bit of LL edema to cause.  LL's even feel a bit weak at times, like when she tries to use her legs to force her recliner back down she has a tough time getting it done.  No sx's in hands or wrists or arms.  NO color changes in LL's.  No calf pain with ambulation.  ROS: +fatigue (describes very long hours of work + taking care of grand-children).    Past Medical History  Diagnosis Date  . Myalgia     Hx of diffuse myalgias, onset in her 46s  . Thickened endometrium 10/16/2011    on CT.  F/u pelvic u/s showed nabothian cyst to account for this..  No enometrial pathology noted on u/s, no ovaries visualized.  Marland Kitchen Hx: UTI (urinary tract infection)     recurrent.  Pyelo hospitalization 2012  . GERD (gastroesophageal reflux disease)   . Microhematuria 02/2012    Cystoscopy with bladder biopsy and cystouretograms--pathology benign.  . Hypertension   . Vertigo   . Degenerative arthritis of cervical spine   . Cataract     right  . Anxiety     Past Surgical History  Procedure Laterality Date  . Back surgery  1990s x 2    Dr. Leeanne Deed in [place]  . Cholecystectomy    . Hysteroscopy  11/2004   Cervical hystogram   . Colonoscopy  approx 2002    Diverticulosis, int hemorrhoids ([facility])    Outpatient Prescriptions Prior to Visit  Medication Sig Dispense Refill  . albuterol (VENTOLIN HFA) 108 (90 BASE) MCG/ACT inhaler Inhale 1-2 puffs into the lungs every 4 (four) hours as needed for wheezing or shortness of breath. 1 Inhaler 0  . aspirin EC 81 MG tablet Take 81 mg by mouth daily.    . DULoxetine (CYMBALTA) 30 MG capsule Take 1 capsule (30 mg total) by mouth daily. 90 capsule 0  . fexofenadine (ALLEGRA) 60 MG tablet Take 1 tablet (60 mg total) by mouth 2 (two) times daily. 60 tablet 6  . fluticasone (CUTIVATE) 0.05 % cream Apply to affected areas bid prn 30 g 1  . fluticasone (FLONASE) 50 MCG/ACT nasal spray Place 2 sprays into both nostrils daily. 16 g 6  . Multiple Vitamin (MULTIVITAMIN) tablet Take 1 tablet by mouth daily.    Marland Kitchen doxycycline (VIBRA-TABS) 100 MG tablet Take 1 tablet (100 mg total) by mouth 2 (two) times daily. (Patient not taking: Reported on 12/19/2015) 20 tablet 0  . guaiFENesin (MUCINEX) 600 MG 12 hr tablet Take 1 tablet (600 mg total) by mouth  2 (two) times daily. (Patient not taking: Reported on 12/19/2015) 30 tablet 0  . meclizine (ANTIVERT) 25 MG tablet Take 1 tablet (25 mg total) by mouth 3 (three) times daily as needed for dizziness. (Patient not taking: Reported on 12/19/2015) 30 tablet 1  . predniSONE (DELTASONE) 20 MG tablet 2 tab po qd x 5 days (Patient not taking: Reported on 12/19/2015) 15 tablet 0   No facility-administered medications prior to visit.    Allergies  Allergen Reactions  . Ciprofloxacin Nausea And Vomiting  . Codeine Other (See Comments)    Feels funny  . Celebrex [Celecoxib] Rash  . Penicillins Rash    ROS As per HPI  PE: Blood pressure 120/85, pulse 70, temperature 98.2 F (36.8 C), temperature source Oral, resp. rate 16, height 5' 7"  (1.702 m), weight 178 lb 8 oz (80.967 kg), SpO2 94 %. Gen: Alert, well appearing.   Patient is oriented to person, place, time, and situation. LE strength 5/5 prox bilat, 5-/5 with LL ext/flex bilat, and 5/5 with ankle flexion/ext bilat. Mild decreased fine touch sensation in both LL's from about mid tibia level down into feet. 1+ pitting edema in pretibial regions bilat.  No significant skin changes.  DP and PT pulses 1+ bilat.  LABS:  Lab Results  Component Value Date   VITAMINB12 72* 12/19/2015   Lab Results  Component Value Date   WBC 6.9 12/19/2015   HGB 13.6 12/19/2015   HCT 40.0 12/19/2015   MCV 94.1 12/19/2015   PLT 226.0 12/19/2015     Chemistry      Component Value Date/Time   NA 140 12/19/2015 1440   K 4.0 12/19/2015 1440   CL 104 12/19/2015 1440   CO2 31 12/19/2015 1440   BUN 12 12/19/2015 1440   CREATININE 0.63 12/19/2015 1440      Component Value Date/Time   CALCIUM 9.2 12/19/2015 1440   ALKPHOS 87 12/19/2015 1440   AST 20 12/19/2015 1440   ALT 13 12/19/2015 1440   BILITOT 0.7 12/19/2015 1440     Lab Results  Component Value Date   TSH 1.09 12/19/2015    IMPRESSION AND PLAN:  1) Lower legs with paresthesias c/w peripheral neuropathy. Looking back at her lab work, she had very low vit B12 in 2012 and she says she never even knew about this and has no recollection of having any vit B12 replacement done.  This would certainly give her the sx's she has now and it fits the time frame of her symptoms.  I aquired her as a patient about a year after these vit B12 labs were done.   Will recheck vit B12 level and a few other labs today: CMET, TSH, HbA1c, CBC, ESR, and CRP. If these happen to all come back normal, will get her set up for neurology referral.  2) Mild chronic venous insufficiency edema of LL's: discussed low Na diet and gave pt handout for this, discussed elevation of legs prn.  An After Visit Summary was printed and given to the patient.  FOLLOW UP: Return for f/u to be determined by results of w/u.   Signed:  Crissie Sickles,  MD           12/20/2015

## 2015-12-21 ENCOUNTER — Encounter: Payer: Self-pay | Admitting: Family Medicine

## 2015-12-22 ENCOUNTER — Other Ambulatory Visit: Payer: Self-pay | Admitting: *Deleted

## 2015-12-22 DIAGNOSIS — E538 Deficiency of other specified B group vitamins: Secondary | ICD-10-CM

## 2015-12-23 ENCOUNTER — Ambulatory Visit (INDEPENDENT_AMBULATORY_CARE_PROVIDER_SITE_OTHER): Payer: Medicare Other | Admitting: Family Medicine

## 2015-12-23 DIAGNOSIS — E538 Deficiency of other specified B group vitamins: Secondary | ICD-10-CM | POA: Diagnosis not present

## 2015-12-23 MED ORDER — CYANOCOBALAMIN 1000 MCG/ML IJ SOLN
1000.0000 ug | Freq: Once | INTRAMUSCULAR | Status: AC
  Administered 2015-12-23: 1000 ug via INTRAMUSCULAR

## 2015-12-25 ENCOUNTER — Ambulatory Visit: Payer: Medicare Other

## 2016-01-01 ENCOUNTER — Ambulatory Visit (INDEPENDENT_AMBULATORY_CARE_PROVIDER_SITE_OTHER): Payer: Medicare Other | Admitting: *Deleted

## 2016-01-01 DIAGNOSIS — E538 Deficiency of other specified B group vitamins: Secondary | ICD-10-CM

## 2016-01-01 MED ORDER — CYANOCOBALAMIN 1000 MCG/ML IJ SOLN
1000.0000 ug | Freq: Once | INTRAMUSCULAR | Status: AC
  Administered 2016-01-01: 1000 ug via INTRAMUSCULAR

## 2016-01-08 ENCOUNTER — Ambulatory Visit (INDEPENDENT_AMBULATORY_CARE_PROVIDER_SITE_OTHER): Payer: Medicare Other | Admitting: *Deleted

## 2016-01-08 DIAGNOSIS — E538 Deficiency of other specified B group vitamins: Secondary | ICD-10-CM | POA: Diagnosis not present

## 2016-01-08 MED ORDER — CYANOCOBALAMIN 1000 MCG/ML IJ SOLN
1000.0000 ug | Freq: Once | INTRAMUSCULAR | Status: AC
  Administered 2016-01-08: 1000 ug via INTRAMUSCULAR

## 2016-01-15 ENCOUNTER — Ambulatory Visit (INDEPENDENT_AMBULATORY_CARE_PROVIDER_SITE_OTHER): Payer: Medicare Other | Admitting: *Deleted

## 2016-01-15 DIAGNOSIS — E538 Deficiency of other specified B group vitamins: Secondary | ICD-10-CM

## 2016-01-15 MED ORDER — CYANOCOBALAMIN 1000 MCG/ML IJ SOLN
1000.0000 ug | Freq: Once | INTRAMUSCULAR | Status: AC
  Administered 2016-01-15: 1000 ug via INTRAMUSCULAR

## 2016-01-15 NOTE — Progress Notes (Signed)
Patient presents today for B12 injection as ordered per Dr Anitra Lauth. This is 4th weekly injection. Patient due for Lab for recheck B12 level in 2 weeks. Patient aware.

## 2016-01-30 ENCOUNTER — Other Ambulatory Visit: Payer: Medicare Other

## 2016-02-02 ENCOUNTER — Other Ambulatory Visit: Payer: Self-pay | Admitting: Family Medicine

## 2016-02-02 ENCOUNTER — Other Ambulatory Visit (INDEPENDENT_AMBULATORY_CARE_PROVIDER_SITE_OTHER): Payer: Medicare Other

## 2016-02-02 DIAGNOSIS — E538 Deficiency of other specified B group vitamins: Secondary | ICD-10-CM

## 2016-02-02 DIAGNOSIS — Z1231 Encounter for screening mammogram for malignant neoplasm of breast: Secondary | ICD-10-CM

## 2016-02-02 LAB — VITAMIN B12: Vitamin B-12: 215 pg/mL (ref 211–911)

## 2016-02-04 ENCOUNTER — Ambulatory Visit (HOSPITAL_COMMUNITY)
Admission: RE | Admit: 2016-02-04 | Discharge: 2016-02-04 | Disposition: A | Discharge status: Home / Self Care | Payer: Medicare Other | Source: Ambulatory Visit | Attending: Family Medicine | Admitting: Family Medicine

## 2016-02-04 DIAGNOSIS — Z1231 Encounter for screening mammogram for malignant neoplasm of breast: Secondary | ICD-10-CM | POA: Diagnosis present

## 2016-02-05 ENCOUNTER — Ambulatory Visit (INDEPENDENT_AMBULATORY_CARE_PROVIDER_SITE_OTHER): Payer: Medicare Other | Admitting: *Deleted

## 2016-02-05 DIAGNOSIS — E538 Deficiency of other specified B group vitamins: Secondary | ICD-10-CM

## 2016-02-05 MED ORDER — CYANOCOBALAMIN 1000 MCG/ML IJ SOLN
1000.0000 ug | Freq: Once | INTRAMUSCULAR | Status: AC
  Administered 2016-02-05: 1000 ug via INTRAMUSCULAR

## 2016-02-05 NOTE — Progress Notes (Signed)
Patient presents today for B 12 injection per Dr Anitra Lauth. Patient tolerated injection well.

## 2016-02-11 ENCOUNTER — Ambulatory Visit (INDEPENDENT_AMBULATORY_CARE_PROVIDER_SITE_OTHER): Payer: Medicare Other | Admitting: *Deleted

## 2016-02-11 ENCOUNTER — Telehealth: Payer: Self-pay | Admitting: *Deleted

## 2016-02-11 ENCOUNTER — Encounter: Payer: Self-pay | Admitting: Family Medicine

## 2016-02-11 DIAGNOSIS — E538 Deficiency of other specified B group vitamins: Secondary | ICD-10-CM

## 2016-02-11 MED ORDER — CYANOCOBALAMIN 1000 MCG/ML IJ SOLN
1000.0000 ug | Freq: Once | INTRAMUSCULAR | Status: AC
  Administered 2016-02-11: 1000 ug via INTRAMUSCULAR

## 2016-02-11 NOTE — Progress Notes (Signed)
Patient presents today for B 12 injection per Dr Idelle Leech orders. Patient tolerated injection well . Next injection next week.

## 2016-02-11 NOTE — Telephone Encounter (Signed)
Letter printed.

## 2016-02-11 NOTE — Telephone Encounter (Signed)
Pt came in today for her B12 injection. She stated that she has retired and has already went out of work but since her actual retire date isn't until next Friday (02/20/16) her employer is not wanting to pay her for the remaining 8 days. She wants to know if Dr. Anitra Lauth can write her a note to be out for the remaining days. She stated that she has been really tired/fatigued and that is why she has went out early. She is requesting a note for the following days: 02/11/16 to 02/20/16. Please advise. Thanks.

## 2016-02-11 NOTE — Telephone Encounter (Signed)
Left message stating that letter is ready for p/u.

## 2016-02-12 ENCOUNTER — Encounter: Payer: Self-pay | Admitting: Family Medicine

## 2016-02-12 ENCOUNTER — Ambulatory Visit (INDEPENDENT_AMBULATORY_CARE_PROVIDER_SITE_OTHER): Payer: Medicare Other | Admitting: Family Medicine

## 2016-02-12 VITALS — BP 132/93 | HR 66 | Temp 98.5°F | Resp 16 | Ht 67.0 in | Wt 175.0 lb

## 2016-02-12 DIAGNOSIS — L508 Other urticaria: Secondary | ICD-10-CM

## 2016-02-12 MED ORDER — PREDNISONE 20 MG PO TABS: ORAL_TABLET | ORAL | Status: DC

## 2016-02-12 MED ORDER — DULOXETINE HCL 30 MG PO CPEP: 30.0000 mg | ORAL_CAPSULE | Freq: Every day | ORAL | Status: DC

## 2016-02-12 MED ORDER — FEXOFENADINE HCL 60 MG PO TABS: 60.0000 mg | ORAL_TABLET | Freq: Two times a day (BID) | ORAL | Status: DC

## 2016-02-12 NOTE — Patient Instructions (Signed)
Take benadryl (OTC) 25 mg every 6 hours as needed for itching.

## 2016-02-12 NOTE — Progress Notes (Signed)
Pre visit review using our clinic review tool, if applicable. No additional management support is needed unless otherwise documented below in the visit note. 

## 2016-02-12 NOTE — Progress Notes (Signed)
OFFICE VISIT  02/12/2016   CC:  Chief Complaint  Patient presents with  . Rash    started yesterday on both legs   HPI:    Patient is a 75 y.o. Caucasian female who presents for rash. Onset last evening on R upper and L upper leg areas: R worse than L.  It has stayed the same since she first noted it--not progressing.  Itchy and tingly sensation.  Also describes itchy and tingly sensation on lower back, tongue, and circumferentially around her mouth.  However, no rash on back, no tongue or lips swelling.  No wheezing, SOB, CP, dizziness, diarrhea, or swelling of the eyes or face. No potential aeroallergen, contact allergen, or food allergen has been detected.   No one around her has had any similar rash.  No recent URI or gastroenteritis.  No fevers and no joint swelling.   Past Medical History  Diagnosis Date  . Myalgia     Hx of diffuse myalgias, onset in her 50s  . Thickened endometrium 10/16/2011    on CT.  F/u pelvic u/s showed nabothian cyst to account for this..  No enometrial pathology noted on u/s, no ovaries visualized.  Marland Kitchen Hx: UTI (urinary tract infection)     recurrent.  Pyelo hospitalization 2012  . GERD (gastroesophageal reflux disease)   . Microhematuria 02/2012    Cystoscopy with bladder biopsy and cystouretograms--pathology benign.  . Hypertension   . Vertigo   . Degenerative arthritis of cervical spine   . Cataract     right  . Anxiety   . Vitamin B12 deficiency 11/2015    + LL PN    Past Surgical History  Procedure Laterality Date  . Back surgery  1990s x 2    Dr. Leeanne Deed in [place]  . Cholecystectomy    . Hysteroscopy  11/2004    Cervical hystogram   . Colonoscopy  approx 2002    Diverticulosis, int hemorrhoids ([facility])    Outpatient Prescriptions Prior to Visit  Medication Sig Dispense Refill  . albuterol (VENTOLIN HFA) 108 (90 BASE) MCG/ACT inhaler Inhale 1-2 puffs into the lungs every 4 (four) hours as needed for wheezing or shortness of  breath. 1 Inhaler 0  . aspirin EC 81 MG tablet Take 81 mg by mouth daily.    . fluticasone (CUTIVATE) 0.05 % cream Apply to affected areas bid prn 30 g 1  . fluticasone (FLONASE) 50 MCG/ACT nasal spray Place 2 sprays into both nostrils daily. 16 g 6  . Multiple Vitamin (MULTIVITAMIN) tablet Take 1 tablet by mouth daily.    . DULoxetine (CYMBALTA) 30 MG capsule Take 1 capsule (30 mg total) by mouth daily. 90 capsule 0  . fexofenadine (ALLEGRA) 60 MG tablet Take 1 tablet (60 mg total) by mouth 2 (two) times daily. 60 tablet 6   No facility-administered medications prior to visit.    Allergies  Allergen Reactions  . Ciprofloxacin Nausea And Vomiting  . Codeine Other (See Comments)    Feels funny  . Celebrex [Celecoxib] Rash  . Penicillins Rash    ROS As per HPI  PE: Blood pressure 132/93, pulse 66, temperature 98.5 F (36.9 C), temperature source Oral, resp. rate 16, height 5\' 7"  (1.702 m), weight 175 lb (79.379 kg), SpO2 96 %. Gen: Alert, well appearing.  Patient is oriented to person, place, time, and situation. ENT: Ears: EACs clear, normal epithelium.  TMs with good light reflex and landmarks bilaterally.  Eyes: no injection, icteris, swelling, or  exudate.  EOMI, PERRLA. Nose: no drainage or turbinate edema/swelling.  No injection or focal lesion.  Mouth: lips without lesion/swelling.  Oral mucosa pink and moist.  Dentition intact and without obvious caries or gingival swelling.  Oropharynx without erythema, exudate, or swelling.  CV: RRR, no m/r/g.   LUNGS: CTA bilat, nonlabored resps, good aeration in all lung fields. SKIN: normal except a splotchy, irregular-shaped patch of blanching erythematous rash, dark pink color--on R thigh > L thigh.  +Warmth over the rash.  No tenderness.  No target lesions, ecchymoses, pustules, papules, vesicles, or petechiae.  LABS:  none  IMPRESSION AND PLAN:  Urticarial rash, R>L thigh.  Also with some sensations of tingling on low back, tongue,  and peri-oral area WITHOUT any swelling or skin changes in these areas. Unknown etiology. Prednisone 40mg  qd x 5d, then 20mg  qd x 5d, then 10mg  qd x 6d, then stop. Recommended benadryl 25mg  q6h prn itching.  An After Visit Summary was printed and given to the patient.  FOLLOW UP: Return if symptoms worsen or fail to improve.  Signed:  Crissie Sickles, MD           02/12/2016

## 2016-02-17 ENCOUNTER — Ambulatory Visit (INDEPENDENT_AMBULATORY_CARE_PROVIDER_SITE_OTHER): Payer: Medicare Other | Admitting: *Deleted

## 2016-02-17 DIAGNOSIS — E538 Deficiency of other specified B group vitamins: Secondary | ICD-10-CM

## 2016-02-17 MED ORDER — CYANOCOBALAMIN 1000 MCG/ML IJ SOLN
1000.0000 ug | Freq: Once | INTRAMUSCULAR | Status: AC
  Administered 2016-02-17: 1000 ug via INTRAMUSCULAR

## 2016-02-17 NOTE — Progress Notes (Signed)
Pt presented to office today for her 3rd of 4 weekly vitamin b12 injections. She will return next week for her 4th injection and will then have lab rechecked one week after her 4th injection. Future order in EMR. Pt tolerated injection well.

## 2016-02-23 DIAGNOSIS — K449 Diaphragmatic hernia without obstruction or gangrene: Secondary | ICD-10-CM

## 2016-02-23 HISTORY — DX: Diaphragmatic hernia without obstruction or gangrene: K44.9

## 2016-02-25 ENCOUNTER — Encounter: Payer: Self-pay | Admitting: Family Medicine

## 2016-02-25 ENCOUNTER — Ambulatory Visit (INDEPENDENT_AMBULATORY_CARE_PROVIDER_SITE_OTHER): Payer: Medicare Other | Admitting: Family Medicine

## 2016-02-25 VITALS — BP 120/87 | HR 78 | Temp 98.0°F | Resp 16 | Ht 67.0 in | Wt 176.5 lb

## 2016-02-25 DIAGNOSIS — R1032 Left lower quadrant pain: Secondary | ICD-10-CM

## 2016-02-25 DIAGNOSIS — E538 Deficiency of other specified B group vitamins: Secondary | ICD-10-CM

## 2016-02-25 DIAGNOSIS — R1012 Left upper quadrant pain: Secondary | ICD-10-CM

## 2016-02-25 LAB — COMPREHENSIVE METABOLIC PANEL
ALBUMIN: 4 g/dL (ref 3.5–5.2)
ALK PHOS: 86 U/L (ref 39–117)
ALT: 14 U/L (ref 0–35)
AST: 16 U/L (ref 0–37)
BUN: 16 mg/dL (ref 6–23)
CALCIUM: 9.5 mg/dL (ref 8.4–10.5)
CHLORIDE: 104 meq/L (ref 96–112)
CO2: 29 mEq/L (ref 19–32)
CREATININE: 0.67 mg/dL (ref 0.40–1.20)
GFR: 91.42 mL/min (ref >60.00)
Glucose, Bld: 101 mg/dL — ABNORMAL HIGH (ref 70–99)
POTASSIUM: 4.5 meq/L (ref 3.5–5.1)
Sodium: 140 mEq/L (ref 135–145)
Total Bilirubin: 0.8 mg/dL (ref 0.2–1.2)
Total Protein: 6.8 g/dL (ref 6.0–8.3)

## 2016-02-25 LAB — CBC WITH DIFFERENTIAL/PLATELET
BASOS PCT: 0.6 % (ref 0.0–3.0)
Basophils Absolute: 0.1 10*3/uL (ref 0.0–0.1)
EOS PCT: 3.6 % (ref 0.0–5.0)
Eosinophils Absolute: 0.3 10*3/uL (ref 0.0–0.7)
HEMATOCRIT: 44.2 % (ref 36.0–46.0)
HEMOGLOBIN: 14.8 g/dL (ref 12.0–15.0)
LYMPHS ABS: 2.6 10*3/uL (ref 0.7–4.0)
Lymphocytes Relative: 26.9 % (ref 12.0–46.0)
MCHC: 33.4 g/dL (ref 30.0–36.0)
MCV: 95.6 fl (ref 78.0–100.0)
MONOS PCT: 10.3 % (ref 3.0–12.0)
Monocytes Absolute: 1 10*3/uL (ref 0.1–1.0)
NEUTROS ABS: 5.6 10*3/uL (ref 1.4–7.7)
Neutrophils Relative %: 58.6 % (ref 43.0–77.0)
Platelets: 257 10*3/uL (ref 150.0–400.0)
RBC: 4.63 Mil/uL (ref 3.87–5.11)
RDW: 14 % (ref 11.5–15.5)
WBC: 9.5 10*3/uL (ref 4.0–10.5)

## 2016-02-25 LAB — LIPASE: LIPASE: 27 U/L (ref 11.0–59.0)

## 2016-02-25 MED ORDER — CYANOCOBALAMIN 1000 MCG/ML IJ SOLN
1000.0000 ug | Freq: Once | INTRAMUSCULAR | Status: AC
  Administered 2016-02-25: 1000 ug via INTRAMUSCULAR

## 2016-02-25 NOTE — Addendum Note (Signed)
Addended by: Leota Jacobsen on: 02/25/2016 09:55 AM   Modules accepted: Orders

## 2016-02-25 NOTE — Progress Notes (Signed)
OFFICE VISIT  02/25/2016   CC:  Chief Complaint  Patient presents with  . Abdominal Pain    RUQ with swelling x 1 month   HPI:    Patient is a 75 y.o. Caucasian female who presents for about 1 mo history of intermittent LUQ pain that sometimes radiates around her L side to her back.  Now for the last 3 days it has been continuous.  Intensity 7/10.  Eating does not make it worse. Sitting makes it worse.  Having BM doesn't affect it.  BM's normal/regularly occurring.  No nausea. Appetite is good.  No fevers.  The rash she had recently has resolved with the steroids I gave her 02/12/16.  Past Medical History  Diagnosis Date  . Myalgia     Hx of diffuse myalgias, onset in her 92s  . Thickened endometrium 10/16/2011    on CT.  F/u pelvic u/s showed nabothian cyst to account for this..  No enometrial pathology noted on u/s, no ovaries visualized.  Marland Kitchen Hx: UTI (urinary tract infection)     recurrent.  Pyelo hospitalization 2012  . GERD (gastroesophageal reflux disease)   . Microhematuria 02/2012    Cystoscopy with bladder biopsy and cystouretograms--pathology benign.  . Hypertension   . Vertigo   . Degenerative arthritis of cervical spine   . Cataract     right  . Anxiety   . Vitamin B12 deficiency 11/2015    + LL PN    Past Surgical History  Procedure Laterality Date  . Back surgery  1990s x 2    Dr. Leeanne Deed in [place]  . Cholecystectomy    . Hysteroscopy  11/2004    Cervical hystogram   . Colonoscopy  approx 2002    Diverticulosis, int hemorrhoids ([facility])    Outpatient Prescriptions Prior to Visit  Medication Sig Dispense Refill  . albuterol (VENTOLIN HFA) 108 (90 BASE) MCG/ACT inhaler Inhale 1-2 puffs into the lungs every 4 (four) hours as needed for wheezing or shortness of breath. 1 Inhaler 0  . aspirin EC 81 MG tablet Take 81 mg by mouth daily.    . DULoxetine (CYMBALTA) 30 MG capsule Take 1 capsule (30 mg total) by mouth daily. 90 capsule 3  . fexofenadine  (ALLEGRA) 60 MG tablet Take 1 tablet (60 mg total) by mouth 2 (two) times daily. 60 tablet 6  . fluticasone (CUTIVATE) 0.05 % cream Apply to affected areas bid prn 30 g 1  . fluticasone (FLONASE) 50 MCG/ACT nasal spray Place 2 sprays into both nostrils daily. 16 g 6  . Multiple Vitamin (MULTIVITAMIN) tablet Take 1 tablet by mouth daily.    . predniSONE (DELTASONE) 20 MG tablet 2 tabs po qd x 5d, then 1 tab po qd x 5d, then 1/2 tab po qd x 6d, then stop 18 tablet 0   No facility-administered medications prior to visit.    Allergies  Allergen Reactions  . Ciprofloxacin Nausea And Vomiting  . Codeine Other (See Comments)    Feels funny  . Celebrex [Celecoxib] Rash  . Penicillins Rash    ROS As per HPI  PE: Blood pressure 120/87, pulse 78, temperature 98 F (36.7 C), temperature source Oral, resp. rate 16, height 5\' 7"  (1.702 m), weight 176 lb 8 oz (80.06 kg), SpO2 95 %. Gen: Alert, well appearing.  Patient is oriented to person, place, time, and situation. [id number]: no injection, icteris, swelling, or exudate.  EOMI, PERRLA. Mouth: lips without lesion/swelling.  Oral mucosa pink and  moist. Oropharynx without erythema, exudate, or swelling.  CV: RRR, no m/r/g.   LUNGS: CTA bilat, nonlabored resps, good aeration in all lung fields. ABD: soft, nondistended, BS normal.  She has LUQ TTP that lessens as you go down the abdomen into the LLQ. EXT: no clubbing, cyanosis, or edema.  SKIN: no jaundice  LABS:    Chemistry      Component Value Date/Time   NA 140 12/19/2015 1440   K 4.0 12/19/2015 1440   CL 104 12/19/2015 1440   CO2 31 12/19/2015 1440   BUN 12 12/19/2015 1440   CREATININE 0.63 12/19/2015 1440      Component Value Date/Time   CALCIUM 9.2 12/19/2015 1440   ALKPHOS 87 12/19/2015 1440   AST 20 12/19/2015 1440   ALT 13 12/19/2015 1440   BILITOT 0.7 12/19/2015 1440     Lab Results  Component Value Date   WBC 6.9 12/19/2015   HGB 13.6 12/19/2015   HCT 40.0 12/19/2015    MCV 94.1 12/19/2015   PLT 226.0 12/19/2015   Lab Results  Component Value Date   LIPASE 44 10/15/2011   IMPRESSION AND PLAN:  LUQ abdominal pain, with minimal mid lift and lower left abd TTP on exam today. Will check CBC, CMET, and lipase. Will order CT abd/pelv with contrast to further evaluate.  An After Visit Summary was printed and given to the patient.  FOLLOW UP: Return for f/u to be determined based on results of w/u.  Signed:  Crissie Sickles, MD           02/25/2016

## 2016-02-25 NOTE — Progress Notes (Signed)
Pre visit review using our clinic review tool, if applicable. No additional management support is needed unless otherwise documented below in the visit note. 

## 2016-02-26 ENCOUNTER — Ambulatory Visit: Payer: Medicare Other

## 2016-03-03 ENCOUNTER — Other Ambulatory Visit (INDEPENDENT_AMBULATORY_CARE_PROVIDER_SITE_OTHER): Payer: Medicare Other

## 2016-03-03 DIAGNOSIS — E538 Deficiency of other specified B group vitamins: Secondary | ICD-10-CM

## 2016-03-03 LAB — VITAMIN B12: Vitamin B-12: 355 pg/mL (ref 211–911)

## 2016-03-05 ENCOUNTER — Ambulatory Visit (INDEPENDENT_AMBULATORY_CARE_PROVIDER_SITE_OTHER): Payer: Medicare Other | Admitting: Family Medicine

## 2016-03-05 ENCOUNTER — Ambulatory Visit (HOSPITAL_COMMUNITY): Payer: Medicare Other

## 2016-03-05 DIAGNOSIS — E538 Deficiency of other specified B group vitamins: Secondary | ICD-10-CM | POA: Diagnosis not present

## 2016-03-05 MED ORDER — CYANOCOBALAMIN 1000 MCG/ML IJ SOLN
1000.0000 ug | Freq: Once | INTRAMUSCULAR | Status: AC
  Administered 2016-03-05: 1000 ug via INTRAMUSCULAR

## 2016-03-05 MED ORDER — CYANOCOBALAMIN 1000 MCG/ML IJ SOLN: 1000.0000 ug | Freq: Once | INTRAMUSCULAR | Status: AC

## 2016-03-08 ENCOUNTER — Ambulatory Visit (HOSPITAL_COMMUNITY)
Admission: RE | Admit: 2016-03-08 | Discharge: 2016-03-08 | Disposition: A | Discharge status: Home / Self Care | Payer: Medicare Other | Source: Ambulatory Visit | Attending: Family Medicine | Admitting: Family Medicine

## 2016-03-08 DIAGNOSIS — M5136 Other intervertebral disc degeneration, lumbar region: Secondary | ICD-10-CM | POA: Diagnosis not present

## 2016-03-08 DIAGNOSIS — K760 Fatty (change of) liver, not elsewhere classified: Secondary | ICD-10-CM | POA: Diagnosis not present

## 2016-03-08 DIAGNOSIS — R1032 Left lower quadrant pain: Secondary | ICD-10-CM | POA: Diagnosis not present

## 2016-03-08 DIAGNOSIS — K5730 Diverticulosis of large intestine without perforation or abscess without bleeding: Secondary | ICD-10-CM | POA: Insufficient documentation

## 2016-03-08 DIAGNOSIS — R1012 Left upper quadrant pain: Secondary | ICD-10-CM | POA: Diagnosis not present

## 2016-03-08 DIAGNOSIS — K449 Diaphragmatic hernia without obstruction or gangrene: Secondary | ICD-10-CM | POA: Diagnosis not present

## 2016-03-08 HISTORY — DX: Fatty (change of) liver, not elsewhere classified: K76.0

## 2016-03-08 HISTORY — DX: Diaphragmatic hernia without obstruction or gangrene: K44.9

## 2016-03-08 MED ORDER — IOPAMIDOL (ISOVUE-300) INJECTION 61%
100.0000 mL | Freq: Once | INTRAVENOUS | Status: AC | PRN
  Administered 2016-03-08: 100 mL via INTRAVENOUS

## 2016-03-09 ENCOUNTER — Telehealth: Payer: Self-pay | Admitting: Family Medicine

## 2016-03-09 ENCOUNTER — Encounter (HOSPITAL_COMMUNITY): Payer: Self-pay

## 2016-03-09 DIAGNOSIS — R109 Unspecified abdominal pain: Secondary | ICD-10-CM

## 2016-03-09 DIAGNOSIS — K449 Diaphragmatic hernia without obstruction or gangrene: Secondary | ICD-10-CM

## 2016-03-09 NOTE — Telephone Encounter (Signed)
Patient's daughter Shaye Loiselle, calling to request results of abdominal CT performed yesterday.  Please call back with results asap.

## 2016-03-09 NOTE — Telephone Encounter (Signed)
I called Karen Delacruz and left message on her VM to call back to office. I will call later today if she does not call before i leave for the day.

## 2016-03-09 NOTE — Telephone Encounter (Signed)
Please advise. Thanks.  

## 2016-03-10 ENCOUNTER — Ambulatory Visit (INDEPENDENT_AMBULATORY_CARE_PROVIDER_SITE_OTHER): Payer: Medicare Other | Admitting: Family Medicine

## 2016-03-10 DIAGNOSIS — E538 Deficiency of other specified B group vitamins: Secondary | ICD-10-CM

## 2016-03-10 MED ORDER — CYANOCOBALAMIN 1000 MCG/ML IJ SOLN
1000.0000 ug | Freq: Once | INTRAMUSCULAR | Status: AC
  Administered 2016-03-10: 1000 ug via INTRAMUSCULAR

## 2016-03-10 NOTE — Telephone Encounter (Signed)
Referral ordered as per pt's preference.

## 2016-03-10 NOTE — Telephone Encounter (Signed)
Pt was in office today for B12 injection, she wanted me to make you aware that she would like to see Dr. Arnoldo Morale for her hernia.

## 2016-03-17 ENCOUNTER — Ambulatory Visit (INDEPENDENT_AMBULATORY_CARE_PROVIDER_SITE_OTHER): Payer: Medicare Other | Admitting: Family Medicine

## 2016-03-17 DIAGNOSIS — E538 Deficiency of other specified B group vitamins: Secondary | ICD-10-CM

## 2016-03-17 MED ORDER — CYANOCOBALAMIN 1000 MCG/ML IJ SOLN
1000.0000 ug | Freq: Once | INTRAMUSCULAR | Status: AC
  Administered 2016-03-17: 1000 ug via INTRAMUSCULAR

## 2016-03-24 ENCOUNTER — Ambulatory Visit (INDEPENDENT_AMBULATORY_CARE_PROVIDER_SITE_OTHER): Payer: Medicare Other | Admitting: Family Medicine

## 2016-03-24 DIAGNOSIS — E538 Deficiency of other specified B group vitamins: Secondary | ICD-10-CM | POA: Diagnosis not present

## 2016-03-24 MED ORDER — CYANOCOBALAMIN 1000 MCG/ML IJ SOLN
1000.0000 ug | Freq: Once | INTRAMUSCULAR | Status: AC
  Administered 2016-03-24: 1000 ug via INTRAMUSCULAR

## 2016-03-29 ENCOUNTER — Encounter: Payer: Self-pay | Admitting: Surgery

## 2016-03-29 ENCOUNTER — Ambulatory Visit: Payer: Self-pay | Admitting: Surgery

## 2016-03-29 DIAGNOSIS — K5730 Diverticulosis of large intestine without perforation or abscess without bleeding: Secondary | ICD-10-CM | POA: Insufficient documentation

## 2016-03-29 DIAGNOSIS — K449 Diaphragmatic hernia without obstruction or gangrene: Secondary | ICD-10-CM | POA: Insufficient documentation

## 2016-03-29 NOTE — H&P (Signed)
Karen Delacruz 03/29/2016 9:56 AM Location: [facility] Patient #: [id number] DOB: 22-Jan-1941 Married / Language: English / Race: White Female   History of Present Illness Adin Hector MD; 03/29/2016 10:37 AM) The patient is a 75 year old female who presents with a hiatal hernia. Note for "Hiatal hernia": Patient referred by [facility] Aviva [facility]. Concern for large paraesophageal hiatal hernia.  Pleasant moderately active woman. Comes today with her daughter. Concerning for a left-sided upper abdominal and chest wall pain the past 4 months. Intermittent flares. Now a chronic achiness that occasionally gets worse. Increased belching. Worsening dysphagia to solids. No severe nausea or vomiting though. She's had some intermittent coughing episodes on inhalers. She does not smoke. No history of asthma or COPD that she is aware of. Chest fullness and discomfort. Does not radiate to jaw or arm. Not really related to intense physical activity. Occasional soreness when she turns or twists. Usually moves her bowels about every other day. History of normal colonoscopies with sigmoid diverticulosis by Dr. Arnoldo Morale, last one 5 years ago. Some heartburn and reflux. Usually controlled with an antacid medication over-the-counter. Some constipation controlled with Senokot as well. Can walk half hour without difficulty. No exertional chest pain or shortness of breath. Recalls having a cholecystectomy in the early 2000. No other abdominal surgery.  Because of worsening discomfort, CT did scan done. Moderate size paraesophageal hiatal hernia with distal stomach and part of splenic flexure of colon incarcerated within it. No perforation or necrosis. Surgical consultation recommended. She wish to see Dr. Arnoldo Morale. He was concerned for a larger hiatal hernia. He recommended referral to our group for possible more complex repair with mesh  reinforcement.   Other Problems [name], CMA; 03/29/2016 9:57 AM) Arthritis Back Pain Bladder Problems Gastroesophageal Reflux Disease High blood pressure  Past Surgical History [name], CMA; 03/29/2016 9:57 AM) Cataract Surgery Right. Gallbladder Surgery - Laparoscopic Spinal Surgery - Lower Back  Diagnostic Studies History [name], CMA; 03/29/2016 9:57 AM) Colonoscopy 5-10 years ago Mammogram within last year Pap Smear >5 years ago  Allergies [name], CMA; 03/29/2016 9:58 AM) Codeine Phosphate *ANALGESICS - OPIOID* PenicillAMINE *ASSORTED CLASSES* Sulfa Antibiotics  Medication History ([name], CMA; 03/29/2016 10:00 AM) DULoxetine HCl (30MG  Capsule DR Part, Oral) Active. Fexofenadine HCl (60MG  Tablet, Oral) Active. Aspirin (81MG  Tablet Chewable, Oral) Active. Albuterol Sulfate (4MG  Tablet, Oral) Active. Venlafaxine HCl (75MG  Tablet, Oral) Active. Senna (8.6MG  Capsule, Oral) Active. Cyanocobalamin (1000MCG Tablet, Oral) Active. Medications Reconciled  Social History [name], CMA; 03/29/2016 9:57 AM) Caffeine use Carbonated beverages, Coffee, Tea. No alcohol use No drug use Tobacco use Never smoker.  Family History [name], [place]; 03/29/2016 9:57 AM) Cancer Mother. Thyroid problems Mother.  Pregnancy / Birth History [name], [place]; 03/29/2016 9:57 AM) Age at menarche 72 years. Age of menopause 68-55 Gravida 2 Maternal age 98-25 Para 2 Regular periods    Review of Systems ([place]; 03/29/2016 9:57 AM) General Not Present- Appetite Loss, Chills, Fatigue, Fever, Night Sweats, Weight Gain and Weight Loss. Skin Not Present- Change in Wart/Mole, Dryness, Hives, Jaundice, New Lesions, Non-Healing Wounds, Rash and Ulcer. HEENT Not Present- Earache, Hearing Loss, Hoarseness, Nose Bleed, Oral Ulcers, Ringing in the Ears, Seasonal Allergies, Sinus Pain, Sore Throat, Visual Disturbances, Wears glasses/contact  lenses and Yellow Eyes. Respiratory Not Present- Bloody sputum, Chronic Cough, Difficulty Breathing, Snoring and Wheezing. Breast Not Present- Breast Mass, Breast Pain, Nipple Discharge and Skin Changes. Cardiovascular Present- Leg Cramps  and Swelling of Extremities. Not Present- Chest Pain, Difficulty Breathing Lying Down, Palpitations, Rapid Heart Rate and Shortness of Breath. Gastrointestinal Present- Abdominal Pain, Bloating, Excessive gas and Indigestion. Not Present- Bloody Stool, Change in Bowel Habits, Chronic diarrhea, Constipation, Difficulty Swallowing, Gets full quickly at meals, Hemorrhoids, Nausea, Rectal Pain and Vomiting. Female Genitourinary Not Present- Frequency, Nocturia, Painful Urination, Pelvic Pain and Urgency. Musculoskeletal Present- Back Pain, Joint Pain and Muscle Weakness. Not Present- Joint Stiffness, Muscle Pain and Swelling of Extremities. Neurological Not Present- Decreased Memory, Fainting, Headaches, Numbness, Seizures, Tingling, Tremor, Trouble walking and Weakness. Psychiatric Not Present- Anxiety, Bipolar, Change in Sleep Pattern, Depression, Fearful and Frequent crying. Endocrine Not Present- Cold Intolerance, Excessive Hunger, Hair Changes, Heat Intolerance, Hot flashes and New Diabetes. Hematology Not Present- Easy Bruising, Excessive bleeding, Gland problems, HIV and Persistent Infections.  Vitals ([name] CMA; 03/29/2016 9:57 AM) 03/29/2016 9:57 AM Weight: 177 lb Height: 67in Body Surface Area: 1.92 m Body Mass Index: 27.72 kg/m  Temp.: 77F(Temporal)  Pulse: 73 (Regular)  BP: 128/78 (Sitting, Left Arm, Standard)       Physical Exam Adin Hector MD; 03/29/2016 10:33 AM) General Mental Status-Alert. General Appearance-Not in acute distress, Not Sickly. Orientation-Oriented X3. Hydration-Well hydrated. Voice-Normal.  Integumentary Global Assessment Upon inspection and palpation of skin surfaces of the - Axillae:  non-tender, no inflammation or ulceration, no drainage. and Distribution of scalp and body hair is normal. General Characteristics Temperature - normal warmth is noted.  Head and Neck Head-normocephalic, atraumatic with no lesions or palpable masses. Face Global Assessment - atraumatic, no absence of expression. Neck Global Assessment - no abnormal movements, no bruit auscultated on the right, no bruit auscultated on the left, no decreased range of motion, non-tender. Trachea-midline. Thyroid Gland Characteristics - non-tender.  Eye Eyeball - Left-Extraocular movements intact, No Nystagmus. Eyeball - Right-Extraocular movements intact, No Nystagmus. Cornea - Left-No Hazy. Cornea - Right-No Hazy. Sclera/Conjunctiva - Left-No scleral icterus, No Discharge. Sclera/Conjunctiva - Right-No scleral icterus, No Discharge. Pupil - Left-Direct reaction to light normal. Pupil - Right-Direct reaction to light normal.  ENMT Ears Pinna - Left - no drainage observed, no generalized tenderness observed. Right - no drainage observed, no generalized tenderness observed. Nose and Sinuses External Inspection of the Nose - no destructive lesion observed. Inspection of the nares - Left - quiet respiration. Right - quiet respiration. Mouth and Throat Lips - Upper Lip - no fissures observed, no pallor noted. Lower Lip - no fissures observed, no pallor noted. Nasopharynx - no discharge present. Oral Cavity/Oropharynx - Tongue - no dryness observed. Oral Mucosa - no cyanosis observed. Hypopharynx - no evidence of airway distress observed.  Chest and Lung Exam Inspection Movements - Normal and Symmetrical. Accessory muscles - No use of accessory muscles in breathing. Palpation Palpation of the chest reveals - Non-tender. Auscultation Breath sounds - Normal and Clear. Note: Mild left-sided lateral chest wall discomfort. No step-off or fracture. No sternal click or pain with sternal  compression.   Cardiovascular Auscultation Rhythm - Regular. Murmurs & Other Heart Sounds - Auscultation of the heart reveals - No Murmurs and No Systolic Clicks.  Abdomen Inspection Inspection of the abdomen reveals - No Visible peristalsis and No Abnormal pulsations. Umbilicus - No Bleeding, No Urine drainage. Palpation/Percussion Palpation and Percussion of the abdomen reveal - Soft, Non Tender, No Rebound tenderness, No Rigidity (guarding) and No Cutaneous hyperesthesia. Note: Mild discomfort left upper quadrant subcostal region epigastric region. No diastases. No umbilical hernia. Rest the abdomen soft.  Mild left paramedian discomfort as well. Nondistended. No peritonitis. No guarding. No rebound tenderness.   Female Genitourinary Sexual Maturity Tanner 5 - Adult hair pattern. Note: No vaginal bleeding nor discharge. No inguinal hernias. No lymphadenopathy.   Peripheral Vascular Upper Extremity Inspection - Left - No Cyanotic nailbeds, Not Ischemic. Right - No Cyanotic nailbeds, Not Ischemic.  Neurologic Neurologic evaluation reveals -normal attention span and ability to concentrate, able to name objects and repeat phrases. Appropriate fund of knowledge , normal sensation and normal coordination. Mental Status Affect - not angry, not paranoid. Cranial Nerves-Normal Bilaterally. Gait-Normal.  Neuropsychiatric Mental status exam performed with findings of-able to articulate well with normal speech/language, rate, volume and coherence, thought content normal with ability to perform basic computations and apply abstract reasoning and no evidence of hallucinations, delusions, obsessions or homicidal/suicidal ideation.  Musculoskeletal Global Assessment Spine, Ribs and Pelvis - no instability, subluxation or laxity. Right Upper Extremity - no instability, subluxation or laxity. Note: Mild thickening of hand joints consistent with osteoarthritis. Normal  grip.   Lymphatic Head & Neck  General Head & Neck Lymphatics: Bilateral - Description - No Localized lymphadenopathy. Axillary  General Axillary Region: Bilateral - Description - No Localized lymphadenopathy. Femoral & Inguinal  Generalized Femoral & Inguinal Lymphatics: Left - Description - No Localized lymphadenopathy. Right - Description - No Localized lymphadenopathy.    Assessment & Plan Adin Hector MD; 03/29/2016 10:38 AM) PARAESOPHAGEAL HIATAL HERNIA (K44.9) Impression: Left-sided intermittent chest and upper quadrant pain with moderate size hiatal hernia. Distal stomach in splenic flexure colon within it. I suspect this is etiology for her pain and discomfort. Explains dysphagia as well. Increased belching and eructation.  I think she would benefit from surgical reduction and repair of the hiatal hernia. Minimally invasive approach. Try with robotic. I agree with Dr. Arnoldo Morale that most likely biologic mesh reinforcement may be needed to help lower recurrence rate.  I would like to get esophageal manometry to get a sense of esophageal function and rule out any other problems. Ideally would get endoscopy preop as well. We'll probably do it Intra-Op to make sure there are no surprises. Would not change my plan at this time.  I cautioned the patient that her chest wall soreness and abdominal pain will take a few months to fade away after surgery. It may not solve all her issues. Been the absence of anything else on her differential diagnosis, I think this will certainly help. The patient and her daughter are interested in proceeding. We will work to coordinate any convenient time. Perhaps try and schedule as long as manometry gets done beforehand. Current Plans You are being scheduled for surgery - Our schedulers will call you.  You should hear from our office's scheduling department within 5 working days about the location, date, and time of surgery. We try to make accommodations  for patient's preferences in scheduling surgery, but sometimes the OR schedule or the surgeon's schedule prevents [place] from making those accommodations.  If you have not heard from our office [phone]) in 5 working days, call the office and ask for your surgeon's nurse.  If you have other questions about your diagnosis, plan, or surgery, call the office and ask for your surgeon's nurse.  Follow Up - Call [facility] office after tests / studies done to discuss further plans The anatomy & physiology of the foregut and anti-reflux mechanism was discussed. The pathophysiology of hiatal herniation and GERD was discussed. Natural history risks without surgery was discussed. The  patient's symptoms are not adequately controlled by medicines and other non-operative treatments. I feel the risks of no intervention will lead to serious problems that outweigh the operative risks; therefore, I recommended surgery to reduce the hiatal hernia out of the chest and fundoplication to rebuild the anti-reflux valve and control reflux better. Need for a thorough workup to rule out the differential diagnosis and plan treatment was explained. I explained laparoscopic techniques with possible need for an open approach.  Risks such as bleeding, infection, abscess, leak, need for further treatment, heart attack, death, and other risks were discussed. I noted a good likelihood this will help address the problem. Goals of post-operative recovery were discussed as well. Possibility that this will not correct all symptoms was explained. Post-operative dysphagia, need for short-term liquid & pureed diet, inability to vomit, possibility of reherniation, possible need for medicines to help control symptoms in addition to surgery were discussed. We will work to minimize complications. Educational handouts further explaining the pathology, treatment options, and dysphagia diet was given as well. Questions were answered. The patient expresses  understanding & wishes to proceed with surgery.  Pt Education - [facility] Esophageal Surgery Diet HCI ([place]ona Pedro): discussed with patient and provided information. Pt Education - [facility] Laparoscopic Surgery HCI I recommended to the patient that they have an evaluation with gastroenterology. Further workup of paraesophageal hiatal hernia with dysphagia and intermittent chest pain/discomfort. Obtain preoperative manometry. See if EGD endoscopic evaluation would be of benefit. Planning robotic paraesophageal hiatal hernia repair to reduce stomach and colon out of chest. Wished to have preoperative workup completed. Management of digestive tract issues. Perhaps adjustment of medications or additions.  Adin Hector, M.D., F.A.C.S. Gastrointestinal and Minimally Invasive Surgery [facility] [address], [place], [place] [id number] [phone] Main / Paging

## 2016-03-31 ENCOUNTER — Other Ambulatory Visit (INDEPENDENT_AMBULATORY_CARE_PROVIDER_SITE_OTHER): Payer: Medicare Other

## 2016-03-31 DIAGNOSIS — E538 Deficiency of other specified B group vitamins: Secondary | ICD-10-CM | POA: Diagnosis not present

## 2016-03-31 LAB — VITAMIN B12: Vitamin B-12: 523 pg/mL (ref 211–911)

## 2016-04-02 ENCOUNTER — Ambulatory Visit (INDEPENDENT_AMBULATORY_CARE_PROVIDER_SITE_OTHER): Payer: Medicare Other | Admitting: Family Medicine

## 2016-04-02 DIAGNOSIS — E538 Deficiency of other specified B group vitamins: Secondary | ICD-10-CM

## 2016-04-02 MED ORDER — CYANOCOBALAMIN 1000 MCG/ML IJ SOLN
1000.0000 ug | Freq: Once | INTRAMUSCULAR | Status: AC
  Administered 2016-04-02: 1000 ug via INTRAMUSCULAR

## 2016-04-05 ENCOUNTER — Telehealth: Payer: Self-pay | Admitting: Gastroenterology

## 2016-04-05 NOTE — Telephone Encounter (Signed)
Scheduled for an esophageal manometry for [facility]. Called to [facility]. No earlier procedure dates available. Called to [employer]. No answer. Did not leave a message.

## 2016-04-19 ENCOUNTER — Encounter (HOSPITAL_COMMUNITY)
Admission: RE | Disposition: A | Discharge status: Home / Self Care | Payer: Self-pay | Source: Ambulatory Visit | Attending: Gastroenterology

## 2016-04-19 ENCOUNTER — Ambulatory Visit (HOSPITAL_COMMUNITY)
Admission: RE | Admit: 2016-04-19 | Discharge: 2016-04-19 | Disposition: A | Discharge status: Home / Self Care | Payer: Medicare Other | Source: Ambulatory Visit | Attending: Gastroenterology | Admitting: Gastroenterology

## 2016-04-19 DIAGNOSIS — Z01818 Encounter for other preprocedural examination: Secondary | ICD-10-CM | POA: Diagnosis not present

## 2016-04-19 DIAGNOSIS — R12 Heartburn: Secondary | ICD-10-CM | POA: Diagnosis not present

## 2016-04-19 HISTORY — PX: ESOPHAGEAL MANOMETRY: SHX5429

## 2016-04-19 SURGERY — MANOMETRY, ESOPHAGUS

## 2016-04-19 MED ORDER — LIDOCAINE VISCOUS 2 % MT SOLN
OROMUCOSAL | Status: AC
  Filled 2016-04-19: qty 15

## 2016-04-19 SURGICAL SUPPLY — 2 items
FACESHIELD LNG OPTICON STERILE (SAFETY) IMPLANT
GLOVE BIO SURGEON STRL SZ8 (GLOVE) ×6 IMPLANT

## 2016-04-19 NOTE — Progress Notes (Signed)
Esophageal Manometry done per protocol. Pt tolerated well. Husband at bedside during procedure. Pt was shakey during last half of test and when done. Pt stated she was [facility], and said it was just hard to do. Pt given soda to calm down and rest for a few minutes before discharge. Pt stated she felt better after drinking her soda and was not shaking anymore. Pt left with her husband , who was driving her home and she was talking and no longer shakey. She stated she was feeling fine and was just glad the test was over. Report to be sent to Dr. Woodward Ku office

## 2016-04-20 ENCOUNTER — Encounter (HOSPITAL_COMMUNITY): Payer: Self-pay | Admitting: Gastroenterology

## 2016-04-23 DIAGNOSIS — Z01818 Encounter for other preprocedural examination: Secondary | ICD-10-CM | POA: Insufficient documentation

## 2016-04-28 ENCOUNTER — Encounter (HOSPITAL_COMMUNITY): Payer: Self-pay | Admitting: Family Medicine

## 2016-04-28 ENCOUNTER — Ambulatory Visit: Payer: Self-pay | Admitting: Surgery

## 2016-04-28 NOTE — H&P (Signed)
Karen Delacruz  Location: [facility] Surgery Patient #: [id number] DOB: 1941-09-07 Married / Language: English / Race: White Female   History of Present Illness  The patient is a 75 year old female who presents with a hiatal hernia. Note for "Hiatal hernia": Patient [facility] by Dr[facility] Aviva [facility]. Concern for large paraesophageal hiatal hernia.  Pleasant moderately active woman. Comes today with her daughter. Concerning for a left-sided upper abdominal and chest wall pain the past 4 months. Intermittent flares. Now a chronic achiness that occasionally gets worse. Increased belching. Worsening dysphagia to solids. No severe nausea or vomiting though. She's had some intermittent coughing episodes on inhalers. She does not smoke. No history of asthma or COPD that she is aware of. Chest fullness and discomfort. Does not radiate to jaw or arm. Not really related to intense physical activity. Occasional soreness when she turns or twists. Usually moves her bowels about every other day. History of normal colonoscopies with sigmoid diverticulosis by Dr. Arnoldo Morale, last one 5 years ago. Some heartburn and reflux. Usually controlled with an antacid medication over-the-counter. Some constipation controlled with Senokot as well. Can walk half hour without difficulty. No exertional chest pain or shortness of breath. Recalls having a cholecystectomy in the early 2000. No other abdominal surgery.  Because of worsening discomfort, CT did scan done. Moderate size paraesophageal hiatal hernia with distal stomach and part of splenic flexure of colon incarcerated within it. No perforation or necrosis. Surgical consultation recommended. She wish to see Dr. Arnoldo Morale. He was concerned for a larger hiatal hernia. He recommended referral to our group for possible more complex repair with mesh reinforcement.   Other Problems [name], CMA; 03/29/2016 9:57  AM) Arthritis Back Pain Bladder Problems Gastroesophageal Reflux Disease High blood pressure  Past Surgical History [name], CMA; 03/29/2016 9:57 AM) Cataract Surgery Right. Gallbladder Surgery - Laparoscopic Spinal Surgery - Lower Back  Diagnostic Studies History [name], CMA; 03/29/2016 9:57 AM) Colonoscopy 5-10 years ago Mammogram within last year Pap Smear >5 years ago  Allergies [name], CMA; 03/29/2016 9:58 AM) Codeine Phosphate *ANALGESICS - OPIOID* PenicillAMINE *ASSORTED CLASSES* Sulfa Antibiotics  Medication History ([name], CMA; 03/29/2016 10:00 AM) DULoxetine HCl (30MG  Capsule DR Part, Oral) Active. Fexofenadine HCl (60MG  Tablet, Oral) Active. Aspirin (81MG  Tablet Chewable, Oral) Active. Albuterol Sulfate (4MG  Tablet, Oral) Active. Venlafaxine HCl (75MG  Tablet, Oral) Active. Senna (8.6MG  Capsule, Oral) Active. Cyanocobalamin (1000MCG Tablet, Oral) Active. Medications Reconciled  Social History [name], CMA; 03/29/2016 9:57 AM) Caffeine use Carbonated beverages, Coffee, Tea. No alcohol use No drug use Tobacco use Never smoker.  Family History [name], [place]; 03/29/2016 9:57 AM) Cancer Mother. Thyroid problems Mother.  Pregnancy / Birth History [name], [place]; 03/29/2016 9:57 AM) Age at menarche 3 years. Age of menopause 33-55 Gravida 2 Maternal age 57-25 Para 2 Regular periods    Review of Systems ([place]; 03/29/2016 9:57 AM) General Not Present- Appetite Loss, Chills, Fatigue, Fever, Night Sweats, Weight Gain and Weight Loss. Skin Not Present- Change in Wart/Mole, Dryness, Hives, Jaundice, New Lesions, Non-Healing Wounds, Rash and Ulcer. HEENT Not Present- Earache, Hearing Loss, Hoarseness, Nose Bleed, Oral Ulcers, Ringing in the Ears, Seasonal Allergies, Sinus Pain, Sore Throat, Visual Disturbances, Wears glasses/contact lenses and Yellow Eyes. Respiratory Not Present- Bloody sputum,  Chronic Cough, Difficulty Breathing, Snoring and Wheezing. Breast Not Present- Breast Mass, Breast Pain, Nipple Discharge and Skin Changes. Cardiovascular Present- Leg Cramps and Swelling of Extremities. Not Present- Chest Pain,  Difficulty Breathing Lying Down, Palpitations, Rapid Heart Rate and Shortness of Breath. Gastrointestinal Present- Abdominal Pain, Bloating, Excessive gas and Indigestion. Not Present- Bloody Stool, Change in Bowel Habits, Chronic diarrhea, Constipation, Difficulty Swallowing, Gets full quickly at meals, Hemorrhoids, Nausea, Rectal Pain and Vomiting. Female Genitourinary Not Present- Frequency, Nocturia, Painful Urination, Pelvic Pain and Urgency. Musculoskeletal Present- Back Pain, Joint Pain and Muscle Weakness. Not Present- Joint Stiffness, Muscle Pain and Swelling of Extremities. Neurological Not Present- Decreased Memory, Fainting, Headaches, Numbness, Seizures, Tingling, Tremor, Trouble walking and Weakness. Psychiatric Not Present- Anxiety, Bipolar, Change in Sleep Pattern, Depression, Fearful and Frequent crying. Endocrine Not Present- Cold Intolerance, Excessive Hunger, Hair Changes, Heat Intolerance, Hot flashes and New Diabetes. Hematology Not Present- Easy Bruising, Excessive bleeding, Gland problems, HIV and Persistent Infections.  Vitals ([name] CMA; 03/29/2016 9:57 AM) 03/29/2016 9:57 AM Weight: 177 lb Height: 67in Body Surface Area: 1.92 m Body Mass Index: 27.72 kg/m  Temp.: 79F(Temporal)  Pulse: 73 (Regular)  BP: 128/78 (Sitting, Left Arm, Standard)       Physical Exam Adin Hector MD; 03/29/2016 10:33 AM) General Mental Status-Alert. General Appearance-Not in acute distress, Not Sickly. Orientation-Oriented X3. Hydration-Well hydrated. Voice-Normal.  Integumentary Global Assessment Upon inspection and palpation of skin surfaces of the - Axillae: non-tender, no inflammation or ulceration, no drainage. and  Distribution of scalp and body hair is normal. General Characteristics Temperature - normal warmth is noted.  Head and Neck Head-normocephalic, atraumatic with no lesions or palpable masses. Face Global Assessment - atraumatic, no absence of expression. Neck Global Assessment - no abnormal movements, no bruit auscultated on the right, no bruit auscultated on the left, no decreased range of motion, non-tender. Trachea-midline. Thyroid Gland Characteristics - non-tender.  Eye Eyeball - Left-Extraocular movements intact, No Nystagmus. Eyeball - Right-Extraocular movements intact, No Nystagmus. Cornea - Left-No Hazy. Cornea - Right-No Hazy. Sclera/Conjunctiva - Left-No scleral icterus, No Discharge. Sclera/Conjunctiva - Right-No scleral icterus, No Discharge. Pupil - Left-Direct reaction to light normal. Pupil - Right-Direct reaction to light normal.  ENMT Ears Pinna - Left - no drainage observed, no generalized tenderness observed. Right - no drainage observed, no generalized tenderness observed. Nose and Sinuses External Inspection of the Nose - no destructive lesion observed. Inspection of the nares - Left - quiet respiration. Right - quiet respiration. Mouth and Throat Lips - Upper Lip - no fissures observed, no pallor noted. Lower Lip - no fissures observed, no pallor noted. Nasopharynx - no discharge present. Oral Cavity/Oropharynx - Tongue - no dryness observed. Oral Mucosa - no cyanosis observed. Hypopharynx - no evidence of airway distress observed.  Chest and Lung Exam Inspection Movements - Normal and Symmetrical. Accessory muscles - No use of accessory muscles in breathing. Palpation Palpation of the chest reveals - Non-tender. Auscultation Breath sounds - Normal and Clear. Note: Mild left-sided lateral chest wall discomfort. No step-off or fracture. No sternal click or pain with sternal compression.   Cardiovascular Auscultation Rhythm -  Regular. Murmurs & Other Heart Sounds - Auscultation of the heart reveals - No Murmurs and No Systolic Clicks.  Abdomen Inspection Inspection of the abdomen reveals - No Visible peristalsis and No Abnormal pulsations. Umbilicus - No Bleeding, No Urine drainage. Palpation/Percussion Palpation and Percussion of the abdomen reveal - Soft, Non Tender, No Rebound tenderness, No Rigidity (guarding) and No Cutaneous hyperesthesia. Note: Mild discomfort left upper quadrant subcostal region epigastric region. No diastases. No umbilical hernia. Rest the abdomen soft. Mild left paramedian discomfort as well. Nondistended. No  peritonitis. No guarding. No rebound tenderness.   Female Genitourinary Sexual Maturity Tanner 5 - Adult hair pattern. Note: No vaginal bleeding nor discharge. No inguinal hernias. No lymphadenopathy.   Peripheral Vascular Upper Extremity Inspection - Left - No Cyanotic nailbeds, Not Ischemic. Right - No Cyanotic nailbeds, Not Ischemic.  Neurologic Neurologic evaluation reveals -normal attention span and ability to concentrate, able to name objects and repeat phrases. Appropriate fund of knowledge , normal sensation and normal coordination. Mental Status Affect - not angry, not paranoid. Cranial Nerves-Normal Bilaterally. Gait-Normal.  Neuropsychiatric Mental status exam performed with findings of-able to articulate well with normal speech/language, rate, volume and coherence, thought content normal with ability to perform basic computations and apply abstract reasoning and no evidence of hallucinations, delusions, obsessions or homicidal/suicidal ideation.  Musculoskeletal Global Assessment Spine, Ribs and Pelvis - no instability, subluxation or laxity. Right Upper Extremity - no instability, subluxation or laxity. Note: Mild thickening of hand joints consistent with osteoarthritis. Normal grip.   Lymphatic Head & Neck  General Head & Neck Lymphatics:  Bilateral - Description - No Localized lymphadenopathy. Axillary  General Axillary Region: Bilateral - Description - No Localized lymphadenopathy. Femoral & Inguinal  Generalized Femoral & Inguinal Lymphatics: Left - Description - No Localized lymphadenopathy. Right - Description - No Localized lymphadenopathy.     Assessment & Plan  PARAESOPHAGEAL HIATAL HERNIA (K44.9) Impression: Left-sided intermittent chest and upper quadrant pain with moderate size hiatal hernia. Distal stomach in splenic flexure colon within it. I suspect this is etiology for her pain and discomfort. Explains dysphagia as well. Increased belching and eructation.  I think she would benefit from surgical reduction and repair of the hiatal hernia. Minimally invasive approach. Try with robotic. I agree with Dr. Arnoldo Morale that most likely biologic mesh reinforcement may be needed to help lower recurrence rate.  I would like to get esophageal manometry to get a sense of esophageal function and rule out any other problems. Ideally would get endoscopy preop as well. We'll probably do it Intra-Op to make sure there are no surprises. Would not change my plan at this time.  I cautioned the patient that her chest wall soreness and abdominal pain will take a few months to fade away after surgery. It may not solve all her issues. Been the absence of anything else on her differential diagnosis, I think this will certainly help. The patient and her daughter are interested in proceeding. We will work to coordinate any convenient time. Perhaps try and schedule as long as manometry gets done beforehand. Current Plans You are being scheduled for surgery - Our schedulers will call you.  You should hear from our office's scheduling department within 5 working days about the location, date, and time of surgery. We try to make accommodations for patient's preferences in scheduling surgery, but sometimes the OR schedule or the surgeon's schedule  prevents [place] from making those accommodations.  If you have not heard from our office [phone]) in 5 working days, call the office and ask for your surgeon's nurse.  If you have other questions about your diagnosis, plan, or surgery, call the office and ask for your surgeon's nurse.  Follow Up - Call [facility] office after tests / studies done to discuss further plans The anatomy & physiology of the foregut and anti-reflux mechanism was discussed. The pathophysiology of hiatal herniation and GERD was discussed. Natural history risks without surgery was discussed. The patient's symptoms are not adequately controlled by medicines and other non-operative treatments. I  feel the risks of no intervention will lead to serious problems that outweigh the operative risks; therefore, I recommended surgery to reduce the hiatal hernia out of the chest and fundoplication to rebuild the anti-reflux valve and control reflux better. Need for a thorough workup to rule out the differential diagnosis and plan treatment was explained. I explained laparoscopic techniques with possible need for an open approach.  Risks such as bleeding, infection, abscess, leak, need for further treatment, heart attack, death, and other risks were discussed. I noted a good likelihood this will help address the problem. Goals of post-operative recovery were discussed as well. Possibility that this will not correct all symptoms was explained. Post-operative dysphagia, need for short-term liquid & pureed diet, inability to vomit, possibility of reherniation, possible need for medicines to help control symptoms in addition to surgery were discussed. We will work to minimize complications. Educational handouts further explaining the pathology, treatment options, and dysphagia diet was given as well. Questions were answered. The patient expresses understanding & wishes to proceed with surgery.  Pt Education - [facility] Esophageal Surgery Diet HCI (Miria Cappelli):  discussed with patient and provided information. Pt Education - [facility] Laparoscopic Surgery HCI  I recommended to the patient that they have an evaluation with gastroenterology. Further workup of paraesophageal hiatal hernia with dysphagia and intermittent chest pain/discomfort. Obtain preoperative manometry. See if EGD endoscopic evaluation would be of benefit. Planning robotic paraesophageal hiatal hernia repair to reduce stomach and colon out of chest. Wished to have preoperative workup completed. Management of digestive tract issues. Perhaps adjustment of medications or additions.  Addendum: Manometry shows no significant esophageal dysmotility.  Reassuring.  Would proceed with had a hernia repair with primary mesh reinforcement and probable fundoplication.  Adin Hector, M.D., F.A.C.S. Gastrointestinal and Minimally Invasive Surgery [facility] [address], [place], [place] [address] [phone] Main / Paging

## 2016-05-03 ENCOUNTER — Ambulatory Visit (INDEPENDENT_AMBULATORY_CARE_PROVIDER_SITE_OTHER): Payer: Medicare Other | Admitting: Family Medicine

## 2016-05-03 DIAGNOSIS — E538 Deficiency of other specified B group vitamins: Secondary | ICD-10-CM

## 2016-05-03 MED ORDER — CYANOCOBALAMIN 1000 MCG/ML IJ SOLN
1000.0000 ug | Freq: Once | INTRAMUSCULAR | Status: AC
  Administered 2016-05-03: 1000 ug via INTRAMUSCULAR

## 2016-05-08 ENCOUNTER — Telehealth: Payer: Self-pay

## 2016-05-08 NOTE — Telephone Encounter (Signed)
Patient is on the list for [employer] 2017 and may be a good candidate for an AWV in 2017. Please let me know if/when appt is scheduled.   

## 2016-05-17 ENCOUNTER — Other Ambulatory Visit (INDEPENDENT_AMBULATORY_CARE_PROVIDER_SITE_OTHER): Payer: Medicare Other

## 2016-05-17 ENCOUNTER — Other Ambulatory Visit: Payer: Medicare Other

## 2016-05-17 DIAGNOSIS — E538 Deficiency of other specified B group vitamins: Secondary | ICD-10-CM | POA: Diagnosis not present

## 2016-05-17 LAB — VITAMIN B12: Vitamin B-12: 342 pg/mL (ref 211–911)

## 2016-05-18 ENCOUNTER — Other Ambulatory Visit: Payer: Self-pay | Admitting: *Deleted

## 2016-05-18 DIAGNOSIS — E538 Deficiency of other specified B group vitamins: Secondary | ICD-10-CM

## 2016-05-20 ENCOUNTER — Ambulatory Visit (INDEPENDENT_AMBULATORY_CARE_PROVIDER_SITE_OTHER): Payer: Medicare Other | Admitting: Family Medicine

## 2016-05-20 DIAGNOSIS — E538 Deficiency of other specified B group vitamins: Secondary | ICD-10-CM | POA: Diagnosis not present

## 2016-05-20 MED ORDER — CYANOCOBALAMIN 1000 MCG/ML IJ SOLN
1000.0000 ug | Freq: Once | INTRAMUSCULAR | Status: AC
  Administered 2016-05-20: 1000 ug via INTRAMUSCULAR

## 2016-05-25 DIAGNOSIS — R9431 Abnormal electrocardiogram [ECG] [EKG]: Secondary | ICD-10-CM

## 2016-05-25 HISTORY — DX: Abnormal electrocardiogram (ECG) (EKG): R94.31

## 2016-06-07 ENCOUNTER — Ambulatory Visit (INDEPENDENT_AMBULATORY_CARE_PROVIDER_SITE_OTHER): Payer: Medicare Other | Admitting: Family Medicine

## 2016-06-07 DIAGNOSIS — E538 Deficiency of other specified B group vitamins: Secondary | ICD-10-CM

## 2016-06-07 MED ORDER — CYANOCOBALAMIN 1000 MCG/ML IJ SOLN: 1000.0000 ug | Freq: Once | INTRAMUSCULAR | Status: DC

## 2016-06-08 NOTE — Telephone Encounter (Signed)
Patient scheduled 08/04/16

## 2016-06-11 ENCOUNTER — Encounter (HOSPITAL_COMMUNITY): Payer: Self-pay

## 2016-06-11 ENCOUNTER — Encounter (HOSPITAL_COMMUNITY)
Admission: RE | Admit: 2016-06-11 | Discharge: 2016-06-11 | Disposition: A | Discharge status: Home / Self Care | Payer: Medicare Other | Source: Ambulatory Visit | Attending: Surgery | Admitting: Surgery

## 2016-06-11 DIAGNOSIS — Z01812 Encounter for preprocedural laboratory examination: Secondary | ICD-10-CM | POA: Insufficient documentation

## 2016-06-11 DIAGNOSIS — Z01810 Encounter for preprocedural cardiovascular examination: Secondary | ICD-10-CM | POA: Diagnosis not present

## 2016-06-11 DIAGNOSIS — I10 Essential (primary) hypertension: Secondary | ICD-10-CM | POA: Diagnosis not present

## 2016-06-11 HISTORY — DX: Personal history of other diseases of the digestive system: Z87.19

## 2016-06-11 LAB — COMPREHENSIVE METABOLIC PANEL
ALT: 19 U/L (ref 14–54)
AST: 26 U/L (ref 15–41)
Albumin: 3.9 g/dL (ref 3.5–5.0)
Alkaline Phosphatase: 74 U/L (ref 38–126)
Anion gap: 7 (ref 5–15)
BILIRUBIN TOTAL: 0.6 mg/dL (ref 0.3–1.2)
BUN: 12 mg/dL (ref 6–20)
CHLORIDE: 105 mmol/L (ref 101–111)
CO2: 29 mmol/L (ref 22–32)
CREATININE: 0.51 mg/dL (ref 0.44–1.00)
Calcium: 9.2 mg/dL (ref 8.9–10.3)
Glucose, Bld: 84 mg/dL (ref 65–99)
Potassium: 3.2 mmol/L — ABNORMAL LOW (ref 3.5–5.1)
Sodium: 141 mmol/L (ref 135–145)
TOTAL PROTEIN: 7.4 g/dL (ref 6.5–8.1)

## 2016-06-11 LAB — CBC
HEMATOCRIT: 40 % (ref 36.0–46.0)
Hemoglobin: 13.4 g/dL (ref 12.0–15.0)
MCH: 31.7 pg (ref 26.0–34.0)
MCHC: 33.5 g/dL (ref 30.0–36.0)
MCV: 94.6 fL (ref 78.0–100.0)
PLATELETS: 256 10*3/uL (ref 150–400)
RBC: 4.23 MIL/uL (ref 3.87–5.11)
RDW: 12.7 % (ref 11.5–15.5)
WBC: 8 10*3/uL (ref 4.0–10.5)

## 2016-06-11 NOTE — Patient Instructions (Addendum)
Karen Delacruz  06/11/2016   Your procedure is scheduled on: 06/18/16  Report to [facility] Main  Entrance take [facility]  elevators to 3rd floor to  [place] at [place]  AM.  Call this number if you have problems the morning of surgery [phone]   Remember: ONLY 1 PERSON MAY GO WITH YOU TO SHORT STAY TO GET  READY MORNING OF [place].  Do not eat food or drink liquids :After Midnight.     Take these medicines the morning of surgery with A SIP OF WATER: NO REGULAR MEDS---MAY USE ALBUTEROL IF NEEDED DO NOT TAKE ANY DIABETIC MEDICATIONS DAY OF YOUR SURGERY                               You may not have any metal on your body including hair pins and              piercings  Do not wear jewelry, make-up, lotions, powders or perfumes, deodorant             Do not wear nail polish.  Do not shave  48 hours prior to surgery.              Men may shave face and neck.   Do not bring valuables to the hospital. [place].  Contacts, dentures or bridgework may not be worn into surgery.  Leave suitcase in the car. After surgery it may be brought to your room.           [place] - Preparing for Surgery Before surgery, you can play an important role.  Because skin is not sterile, your skin needs to be as free of germs as possible.  You can reduce the number of germs on your skin by washing with CHG (chlorahexidine gluconate) soap before surgery.  CHG is an antiseptic cleaner which kills germs and bonds with the skin to continue killing germs even after washing. Please DO NOT use if you have an allergy to CHG or antibacterial soaps.  If your skin becomes reddened/irritated stop using the CHG and inform your nurse when you arrive at Short Stay. Do not shave (including legs and underarms) for at least 48 hours prior to the first CHG shower.  You may shave your face/neck. Please follow these instructions carefully:  1.   Shower with CHG Soap the night before surgery and the  morning of Surgery.  2.  If you choose to wash your hair, wash your hair first as usual with your  normal  shampoo.  3.  After you shampoo, rinse your hair and body thoroughly to remove the  shampoo.                           4.  Use CHG as you would any other liquid soap.  You can apply chg directly  to the skin and wash                       Gently with a scrungie or clean washcloth.  5.  Apply the CHG Soap to your body ONLY FROM THE NECK DOWN.   Do not use on  face/ open                           Wound or open sores. Avoid contact with eyes, ears mouth and genitals (private parts).                       Wash face,  Genitals (private parts) with your normal soap.             6.  Wash thoroughly, paying special attention to the area where your surgery  will be performed.  7.  Thoroughly rinse your body with warm water from the neck down.  8.  DO NOT shower/wash with your normal soap after using and rinsing off  the CHG Soap.                9.  Pat yourself dry with a clean towel.            10.  Wear clean pajamas.            11.  Place clean sheets on your bed the night of your first shower and do not  sleep with pets. Day of Surgery : Do not apply any lotions/deodorants the morning of surgery.  Please wear clean clothes to the hospital/surgery center.  FAILURE TO FOLLOW THESE INSTRUCTIONS MAY RESULT IN THE CANCELLATION OF YOUR SURGERY PATIENT SIGNATURE_________________________________  NURSE SIGNATURE__________________________________  ________________________________________________________________________

## 2016-06-11 NOTE — Progress Notes (Signed)
States takes all medications at night

## 2016-06-12 LAB — ABO/RH: ABO/RH(D): B POS

## 2016-06-17 MED ORDER — GENTAMICIN SULFATE 40 MG/ML IJ SOLN
5.0000 mg/kg | INTRAVENOUS | Status: AC
  Administered 2016-06-18: 360 mg via INTRAVENOUS
  Filled 2016-06-17 (×2): qty 9

## 2016-06-17 NOTE — H&P (Signed)
Karen Delacruz 03/29/2016 9:56 AM Location: [facility] Patient #: [id number] DOB: [birth year] Married / Language: Karen Delacruz / Race: White Female  Patient Care Team: Tammi Sou, MD as PCP - General (Family Medicine) Rolan Bucco, MD (Inactive) as Consulting Physician (Urology) Michael Boston, MD as Consulting Physician (General Surgery) Mauri Pole, MD as Consulting Physician (Gastroenterology)    History of Present Illness  The patient is a 75 year old female who presents with a hiatal hernia. Note for "Hiatal hernia": Patient referred by Dr. Aviva [facility]s [facility]. Concern for large paraesophageal hiatal hernia.  Pleasant moderately active woman. Comes today with her daughter. Concerning for a left-sided upper abdominal and chest wall pain the past 4 months. Intermittent flares. Now a chronic achiness that occasionally gets worse. Increased belching. Worsening dysphagia to solids. No severe nausea or vomiting though. She's had some intermittent coughing episodes on inhalers. She does not smoke. No history of asthma or COPD that she is aware of. Chest fullness and discomfort. Does not radiate to jaw or arm. Not really related to intense physical activity. Occasional soreness when she turns or twists. Usually moves her bowels about every other day. History of normal colonoscopies with sigmoid diverticulosis by Dr. Arnoldo Delacruz, last one 5 years ago. Some heartburn and reflux. Usually controlled with an antacid medication over-the-counter. Some constipation controlled with Senokot as well. Can walk half hour without difficulty. No exertional chest pain or shortness of breath. Recalls having a cholecystectomy in the early 2000. No other abdominal surgery.  Because of worsening discomfort, CT did scan done. Moderate size paraesophageal hiatal hernia with distal stomach and part of splenic flexure of colon incarcerated within it. No  perforation or necrosis. Surgical consultation recommended. She wish to see Dr. Arnoldo Delacruz. He was concerned for a larger hiatal hernia. He recommended referral to our group for possible more complex repair with mesh reinforcement.  Manometry without major concerns.  No new events   Other Problems Karen Delacruz, CMA; 03/29/2016 9:57 AM) Arthritis Back Pain Bladder Problems Gastroesophageal Reflux Disease High blood pressure  Past Surgical History Karen Delacruz, CMA; 03/29/2016 9:57 AM) Cataract Surgery Right. Gallbladder Surgery - Laparoscopic Spinal Surgery - Lower Back  Diagnostic Studies History Karen Delacruz, CMA; 03/29/2016 9:57 AM) Colonoscopy 5-10 years ago Mammogram within last year Pap Smear >5 years ago  Allergies Karen Delacruz, CMA; 03/29/2016 9:58 AM) Codeine Phosphate *ANALGESICS - OPIOID* PenicillAMINE *ASSORTED CLASSES* Sulfa Antibiotics  Medication History (Karen Delacruz, CMA; 03/29/2016 10:00 AM) DULoxetine HCl (30MG  Capsule DR Part, Oral) Active. Fexofenadine HCl (60MG  Tablet, Oral) Active. Aspirin (81MG  Tablet Chewable, Oral) Active. Albuterol Sulfate (4MG  Tablet, Oral) Active. Venlafaxine HCl (75MG  Tablet, Oral) Active. Senna (8.6MG  Capsule, Oral) Active. Cyanocobalamin (1000MCG Tablet, Oral) Active. Medications Reconciled  Social History Karen Delacruz, CMA; 03/29/2016 9:57 AM) Caffeine use Carbonated beverages, Coffee, Tea. No alcohol use No drug use Tobacco use Never smoker.  Family History Karen Delacruz, Karen Delacruz; 03/29/2016 9:57 AM) Cancer Mother. Thyroid problems Mother.  Pregnancy / Birth History Karen Delacruz, [place]; 03/29/2016 9:57 AM) Age at menarche 64 years. Age of menopause 44-55 Gravida 2 Maternal age 54-25 Para 2 Regular periods    Review of Systems (Karen Delacruz; 03/29/2016 9:57 AM) General Not Present- Appetite Loss, Chills, Fatigue, Fever, Night Sweats, Weight Gain and Weight Loss. Skin Not Present- Change in  Wart/Mole, Dryness, Hives, Jaundice, New Lesions, Non-Healing Wounds, Rash and Ulcer. HEENT Not Present- Earache, Hearing Loss, Hoarseness, Nose Bleed, Oral Ulcers, Ringing in the Ears,  Seasonal Allergies, Sinus Pain, Sore Throat, Visual Disturbances, Wears glasses/contact lenses and Yellow Eyes. Respiratory Not Present- Bloody sputum, Chronic Cough, Difficulty Breathing, Snoring and Wheezing. Breast Not Present- Breast Mass, Breast Pain, Nipple Discharge and Skin Changes. Cardiovascular Present- Leg Cramps and Swelling of Extremities. Not Present- Chest Pain, Difficulty Breathing Lying Down, Palpitations, Rapid Heart Rate and Shortness of Breath. Gastrointestinal Present- Abdominal Pain, Bloating, Excessive gas and Indigestion. Not Present- Bloody Stool, Change in Bowel Habits, Chronic diarrhea, Constipation, Difficulty Swallowing, Gets full quickly at meals, Hemorrhoids, Nausea, Rectal Pain and Vomiting. Female Genitourinary Not Present- Frequency, Nocturia, Painful Urination, Pelvic Pain and Urgency. Musculoskeletal Present- Back Pain, Joint Pain and Muscle Weakness. Not Present- Joint Stiffness, Muscle Pain and Swelling of Extremities. Neurological Not Present- Decreased Memory, Fainting, Headaches, Numbness, Seizures, Tingling, Tremor, Trouble walking and Weakness. Psychiatric Not Present- Anxiety, Bipolar, Change in Sleep Pattern, Depression, Fearful and Frequent crying. Endocrine Not Present- Cold Intolerance, Excessive Hunger, Hair Changes, Heat Intolerance, Hot flashes and New Diabetes. Hematology Not Present- Easy Bruising, Excessive bleeding, Gland problems, HIV and Persistent Infections.  Vitals (Weight: 177 lb Height: 67in Body Surface Area: 1.92 m Body Mass Index: 27.72 kg/m  Temp.: 39F(Temporal)  Pulse: 73 (Regular)  BP: 128/78 (Sitting, Left Arm, Standard)       Physical Exam Karen Hector MD; 03/29/2016 10:33 AM) General Mental Status-Alert. General  Appearance-Not in acute distress, Not Sickly. Orientation-Oriented X3. Hydration-Well hydrated. Voice-Normal.  Integumentary Global Assessment Upon inspection and palpation of skin surfaces of the - Axillae: non-tender, no inflammation or ulceration, no drainage. and Distribution of scalp and body hair is normal. General Characteristics Temperature - normal warmth is noted.  Head and Neck Head-normocephalic, atraumatic with no lesions or palpable masses. Face Global Assessment - atraumatic, no absence of expression. Neck Global Assessment - no abnormal movements, no bruit auscultated on the right, no bruit auscultated on the left, no decreased range of motion, non-tender. Trachea-midline. Thyroid Gland Characteristics - non-tender.  Eye Eyeball - Left-Extraocular movements intact, No Nystagmus. Eyeball - Right-Extraocular movements intact, No Nystagmus. Cornea - Left-No Hazy. Cornea - Right-No Hazy. Sclera/Conjunctiva - Left-No scleral icterus, No Discharge. Sclera/Conjunctiva - Right-No scleral icterus, No Discharge. Pupil - Left-Direct reaction to light normal. Pupil - Right-Direct reaction to light normal.  ENMT Ears Pinna - Left - no drainage observed, no generalized tenderness observed. Right - no drainage observed, no generalized tenderness observed. Nose and Sinuses External Inspection of the Nose - no destructive lesion observed. Inspection of the nares - Left - quiet respiration. Right - quiet respiration. Mouth and Throat Lips - Upper Lip - no fissures observed, no pallor noted. Lower Lip - no fissures observed, no pallor noted. Nasopharynx - no discharge present. Oral Cavity/Oropharynx - Tongue - no dryness observed. Oral Mucosa - no cyanosis observed. Hypopharynx - no evidence of airway distress observed.  Chest and Lung Exam Inspection Movements - Normal and Symmetrical. Accessory muscles - No use of accessory muscles in  breathing. Palpation Palpation of the chest reveals - Non-tender. Auscultation Breath sounds - Normal and Clear. Note: Mild left-sided lateral chest wall discomfort. No step-off or fracture. No sternal click or pain with sternal compression.   Cardiovascular Auscultation Rhythm - Regular. Murmurs & Other Heart Sounds - Auscultation of the heart reveals - No Murmurs and No Systolic Clicks.  Abdomen Inspection Inspection of the abdomen reveals - No Visible peristalsis and No Abnormal pulsations. Umbilicus - No Bleeding, No Urine drainage. Palpation/Percussion Palpation and Percussion of the abdomen reveal -  Soft, Non Tender, No Rebound tenderness, No Rigidity (guarding) and No Cutaneous hyperesthesia. Note: Mild discomfort left upper quadrant subcostal region epigastric region. No diastases. No umbilical hernia. Rest the abdomen soft. Mild left paramedian discomfort as well. Nondistended. No peritonitis. No guarding. No rebound tenderness.   Female Genitourinary Sexual Maturity Tanner 5 - Adult hair pattern. Note: No vaginal bleeding nor discharge. No inguinal hernias. No lymphadenopathy.   Peripheral Vascular Upper Extremity Inspection - Left - No Cyanotic nailbeds, Not Ischemic. Right - No Cyanotic nailbeds, Not Ischemic.  Neurologic Neurologic evaluation reveals -normal attention span and ability to concentrate, able to name objects and repeat phrases. Appropriate fund of knowledge , normal sensation and normal coordination. Mental Status Affect - not angry, not paranoid. Cranial Nerves-Normal Bilaterally. Gait-Normal.  Neuropsychiatric Mental status exam performed with findings of-able to articulate well with normal speech/language, rate, volume and coherence, thought content normal with ability to perform basic computations and apply abstract reasoning and no evidence of hallucinations, delusions, obsessions or homicidal/suicidal  ideation.  Musculoskeletal Global Assessment Spine, Ribs and Pelvis - no instability, subluxation or laxity. Right Upper Extremity - no instability, subluxation or laxity. Note: Mild thickening of hand joints consistent with osteoarthritis. Normal grip.   Lymphatic Head & Neck  General Head & Neck Lymphatics: Bilateral - Description - No Localized lymphadenopathy. Axillary  General Axillary Region: Bilateral - Description - No Localized lymphadenopathy. Femoral & Inguinal  Generalized Femoral & Inguinal Lymphatics: Left - Description - No Localized lymphadenopathy. Right - Description - No Localized lymphadenopathy.    Assessment & Plan  PARAESOPHAGEAL HIATAL HERNIA (K44.9) Impression: Left-sided intermittent chest and upper quadrant pain with moderate size hiatal hernia. Distal stomach in splenic flexure colon within it. I suspect this is etiology for her pain and discomfort. Explains dysphagia as well. Increased belching and eructation.  I think she would benefit from surgical reduction and repair of the hiatal hernia. Minimally invasive approach. Try with robotic. I agree with Dr. Arnoldo Delacruz that most likely biologic mesh reinforcement may be needed to help lower recurrence rate.  I would like to get esophageal manometry to get a sense of esophageal function and rule out any other problems. Ideally would get endoscopy preop as well. We'll probably do it Intra-Op to make sure there are no surprises. Would not change my plan at this time.  I cautioned the patient that her chest wall soreness and abdominal pain will take a few months to fade away after surgery. It may not solve all her issues. Been the absence of anything else on her differential diagnosis, I think this will certainly help. The patient and her daughter are interested in proceeding. We will work to coordinate any convenient time. Perhaps try and schedule as long as manometry gets done beforehand. Current Plans You are  being scheduled for surgery - Our schedulers will call you.  You should hear from our office's scheduling department within 5 working days about the location, date, and time of surgery. We try to make accommodations for patient's preferences in scheduling surgery, but sometimes the OR schedule or the surgeon's schedule prevents [place] from making those accommodations.  If you have not heard from our office [phone]) in 5 working days, call the office and ask for your surgeon's nurse.  If you have other questions about your diagnosis, plan, or surgery, call the office and ask for your surgeon's nurse.  Follow Up - Call [facility] office after tests / studies done to discuss further plans The anatomy &  physiology of the foregut and anti-reflux mechanism was discussed. The pathophysiology of hiatal herniation and GERD was discussed. Natural history risks without surgery was discussed. The patient's symptoms are not adequately controlled by medicines and other non-operative treatments. I feel the risks of no intervention will lead to serious problems that outweigh the operative risks; therefore, I recommended surgery to reduce the hiatal hernia out of the chest and fundoplication to rebuild the anti-reflux valve and control reflux better. Need for a thorough workup to rule out the differential diagnosis and plan treatment was explained. I explained laparoscopic techniques with possible need for an open approach.  Risks such as bleeding, infection, abscess, leak, need for further treatment, heart attack, death, and other risks were discussed. I noted a good likelihood this will help address the problem. Goals of post-operative recovery were discussed as well. Possibility that this will not correct all symptoms was explained. Post-operative dysphagia, need for short-term liquid & pureed diet, inability to vomit, possibility of reherniation, possible need for medicines to help control symptoms in addition to surgery  were discussed. We will work to minimize complications. Educational handouts further explaining the pathology, treatment options, and dysphagia diet was given as well. Questions were answered. The patient expresses understanding & wishes to proceed with surgery.  Pt Education - [facility] Esophageal Surgery Diet HCI (Tevion Laforge): discussed with patient and provided information. Pt Education - [facility] Laparoscopic Surgery HCI I recommended to the patient that they have an evaluation with gastroenterology. Further workup of paraesophageal hiatal hernia with dysphagia and intermittent chest pain/discomfort. Obtain preoperative manometry. See if EGD endoscopic evaluation would be of benefit. Planning robotic paraesophageal hiatal hernia repair to reduce stomach and colon out of chest. Wished to have preoperative workup completed. Management of digestive tract issues. Perhaps adjustment of medications or additions.  Karen Delacruz, M.D., F.A.C.S. Gastrointestinal and Minimally Invasive Surgery [facility] [address], [place], [place] [address] [phone] Main / Paging

## 2016-06-17 NOTE — Anesthesia Preprocedure Evaluation (Addendum)
Anesthesia Evaluation  Patient identified by MRN, date of birth, ID band Patient awake    History of Anesthesia Complications Negative for: history of anesthetic complications  Airway Mallampati: II  TM Distance: >3 FB Neck ROM: Full    Dental  (+) Teeth Intact, Dental Advisory Given   Pulmonary neg pulmonary ROS,    breath sounds clear to auscultation       Cardiovascular hypertension,  Rhythm:Regular Rate:Normal     Neuro/Psych  Neuromuscular disease    GI/Hepatic Neg liver ROS, hiatal hernia, GERD  ,  Endo/Other  negative endocrine ROS  Renal/GU negative Renal ROS     Musculoskeletal  (+) Arthritis , Fibromyalgia -  Abdominal   Peds  Hematology negative hematology ROS (+)   Anesthesia Other Findings   Reproductive/Obstetrics                            Anesthesia Physical Anesthesia Plan  ASA: II  Anesthesia Plan: General   Post-op Pain Management:    Induction: Intravenous  Airway Management Planned: Oral ETT  Additional Equipment:   Intra-op Plan:   Post-operative Plan: Extubation in OR  Informed Consent: I have reviewed the patients History and Physical, chart, labs and discussed the procedure including the risks, benefits and alternatives for the proposed anesthesia with the patient or authorized representative who has indicated his/her understanding and acceptance.   Dental advisory given  Plan Discussed with: CRNA  Anesthesia Plan Comments:         Anesthesia Quick Evaluation

## 2016-06-18 ENCOUNTER — Encounter (HOSPITAL_COMMUNITY): Payer: Self-pay | Admitting: *Deleted

## 2016-06-18 ENCOUNTER — Encounter (HOSPITAL_COMMUNITY)
Admission: RE | Disposition: A | Discharge status: Home / Self Care | Payer: Self-pay | Source: Ambulatory Visit | Attending: Surgery

## 2016-06-18 ENCOUNTER — Ambulatory Visit (HOSPITAL_COMMUNITY): Payer: Medicare Other | Admitting: Anesthesiology

## 2016-06-18 ENCOUNTER — Inpatient Hospital Stay (HOSPITAL_COMMUNITY)
Admission: RE | Admit: 2016-06-18 | Discharge: 2016-06-20 | DRG: 328 | Disposition: A | Discharge status: Home / Self Care | Payer: Medicare Other | Source: Ambulatory Visit | Attending: Surgery | Admitting: Surgery

## 2016-06-18 DIAGNOSIS — R1310 Dysphagia, unspecified: Secondary | ICD-10-CM | POA: Diagnosis present

## 2016-06-18 DIAGNOSIS — K219 Gastro-esophageal reflux disease without esophagitis: Secondary | ICD-10-CM | POA: Diagnosis present

## 2016-06-18 DIAGNOSIS — Z885 Allergy status to narcotic agent status: Secondary | ICD-10-CM | POA: Diagnosis not present

## 2016-06-18 DIAGNOSIS — Z79899 Other long term (current) drug therapy: Secondary | ICD-10-CM

## 2016-06-18 DIAGNOSIS — Z882 Allergy status to sulfonamides status: Secondary | ICD-10-CM

## 2016-06-18 DIAGNOSIS — Z7982 Long term (current) use of aspirin: Secondary | ICD-10-CM | POA: Diagnosis not present

## 2016-06-18 DIAGNOSIS — Z880 Allergy status to penicillin: Secondary | ICD-10-CM | POA: Diagnosis not present

## 2016-06-18 DIAGNOSIS — K5900 Constipation, unspecified: Secondary | ICD-10-CM | POA: Diagnosis present

## 2016-06-18 DIAGNOSIS — K449 Diaphragmatic hernia without obstruction or gangrene: Principal | ICD-10-CM

## 2016-06-18 DIAGNOSIS — Z9889 Other specified postprocedural states: Secondary | ICD-10-CM

## 2016-06-18 HISTORY — PX: INSERTION OF MESH: SHX5868

## 2016-06-18 HISTORY — PX: NISSEN FUNDOPLICATION: SHX2091

## 2016-06-18 LAB — TYPE AND SCREEN
ABO/RH(D): B POS
ANTIBODY SCREEN: NEGATIVE

## 2016-06-18 SURGERY — FUNDOPLICATION, NISSEN, ROBOT-ASSISTED, LAPAROSCOPIC
Anesthesia: General | Site: Abdomen

## 2016-06-18 MED ORDER — ENOXAPARIN SODIUM 40 MG/0.4ML ~~LOC~~ SOLN
40.0000 mg | SUBCUTANEOUS | Status: DC
  Administered 2016-06-19 – 2016-06-20 (×2): 40 mg via SUBCUTANEOUS
  Filled 2016-06-18 (×2): qty 0.4

## 2016-06-18 MED ORDER — HYDROMORPHONE HCL 1 MG/ML IJ SOLN
0.2500 mg | INTRAMUSCULAR | Status: DC | PRN
  Administered 2016-06-18: 0.25 mg via INTRAVENOUS

## 2016-06-18 MED ORDER — CLINDAMYCIN PHOSPHATE 900 MG/50ML IV SOLN
INTRAVENOUS | Status: AC
  Filled 2016-06-18: qty 50

## 2016-06-18 MED ORDER — CHLORHEXIDINE GLUCONATE CLOTH 2 % EX PADS: 6.0000 | MEDICATED_PAD | Freq: Once | CUTANEOUS | Status: DC

## 2016-06-18 MED ORDER — BISACODYL 10 MG RE SUPP: 10.0000 mg | Freq: Two times a day (BID) | RECTAL | Status: DC | PRN

## 2016-06-18 MED ORDER — DULOXETINE HCL 30 MG PO CPEP
30.0000 mg | ORAL_CAPSULE | Freq: Every day | ORAL | Status: DC
  Administered 2016-06-18 – 2016-06-19 (×2): 30 mg via ORAL
  Filled 2016-06-18 (×2): qty 1

## 2016-06-18 MED ORDER — ONDANSETRON HCL 4 MG/2ML IJ SOLN
INTRAMUSCULAR | Status: AC
  Filled 2016-06-18: qty 2

## 2016-06-18 MED ORDER — 0.9 % SODIUM CHLORIDE (POUR BTL) OPTIME
TOPICAL | Status: DC | PRN
  Administered 2016-06-18: 2000 mL

## 2016-06-18 MED ORDER — CHLORHEXIDINE GLUCONATE 4 % EX LIQD: 1.0000 "application " | Freq: Once | CUTANEOUS | Status: DC

## 2016-06-18 MED ORDER — PROPOFOL 10 MG/ML IV BOLUS
INTRAVENOUS | Status: AC
  Filled 2016-06-18: qty 20

## 2016-06-18 MED ORDER — PROCHLORPERAZINE EDISYLATE 5 MG/ML IJ SOLN: 5.0000 mg | INTRAMUSCULAR | Status: DC | PRN

## 2016-06-18 MED ORDER — SUGAMMADEX SODIUM 200 MG/2ML IV SOLN
INTRAVENOUS | Status: DC | PRN
  Administered 2016-06-18: 200 mg via INTRAVENOUS

## 2016-06-18 MED ORDER — ROCURONIUM BROMIDE 100 MG/10ML IV SOLN
INTRAVENOUS | Status: DC | PRN
  Administered 2016-06-18 (×2): 20 mg via INTRAVENOUS
  Administered 2016-06-18: 40 mg via INTRAVENOUS
  Administered 2016-06-18: 20 mg via INTRAVENOUS

## 2016-06-18 MED ORDER — NALOXONE HCL 0.4 MG/ML IJ SOLN: 0.1000 mg | Freq: Once | INTRAMUSCULAR | Status: DC

## 2016-06-18 MED ORDER — LIDOCAINE HCL (CARDIAC) 20 MG/ML IV SOLN
INTRAVENOUS | Status: AC
  Filled 2016-06-18: qty 5

## 2016-06-18 MED ORDER — PROPOFOL 10 MG/ML IV BOLUS
INTRAVENOUS | Status: DC | PRN
  Administered 2016-06-18: 120 mg via INTRAVENOUS

## 2016-06-18 MED ORDER — OXYCODONE HCL 5 MG PO TABS: 5.0000 mg | ORAL_TABLET | Freq: Four times a day (QID) | ORAL | 0 refills | Status: DC | PRN

## 2016-06-18 MED ORDER — METOPROLOL TARTRATE 12.5 MG HALF TABLET: 12.5000 mg | ORAL_TABLET | Freq: Two times a day (BID) | ORAL | Status: DC | PRN

## 2016-06-18 MED ORDER — ACETAMINOPHEN 325 MG PO TABS: 650.0000 mg | ORAL_TABLET | ORAL | Status: DC

## 2016-06-18 MED ORDER — NALOXONE HCL 0.4 MG/ML IJ SOLN
INTRAMUSCULAR | Status: AC
  Filled 2016-06-18: qty 1

## 2016-06-18 MED ORDER — ATROPINE SULFATE 0.4 MG/ML IJ SOLN
INTRAMUSCULAR | Status: AC
  Filled 2016-06-18: qty 1

## 2016-06-18 MED ORDER — GABAPENTIN 300 MG PO CAPS: 300.0000 mg | ORAL_CAPSULE | ORAL | Status: DC

## 2016-06-18 MED ORDER — LACTATED RINGERS IR SOLN
Status: DC | PRN
  Administered 2016-06-18: 1000 mL

## 2016-06-18 MED ORDER — SUCCINYLCHOLINE CHLORIDE 200 MG/10ML IV SOSY
PREFILLED_SYRINGE | INTRAVENOUS | Status: DC | PRN
  Administered 2016-06-18: 100 mg via INTRAVENOUS

## 2016-06-18 MED ORDER — ONDANSETRON 4 MG PO TBDP: 4.0000 mg | ORAL_TABLET | Freq: Four times a day (QID) | ORAL | Status: DC | PRN

## 2016-06-18 MED ORDER — DIPHENHYDRAMINE HCL 12.5 MG/5ML PO ELIX: 12.5000 mg | ORAL_SOLUTION | Freq: Four times a day (QID) | ORAL | Status: DC | PRN

## 2016-06-18 MED ORDER — EPHEDRINE SULFATE 50 MG/ML IJ SOLN
INTRAMUSCULAR | Status: AC
  Filled 2016-06-18: qty 1

## 2016-06-18 MED ORDER — METOCLOPRAMIDE HCL 10 MG PO TABS: 10.0000 mg | ORAL_TABLET | Freq: Four times a day (QID) | ORAL | 5 refills | Status: DC | PRN

## 2016-06-18 MED ORDER — FENTANYL CITRATE (PF) 250 MCG/5ML IJ SOLN
INTRAMUSCULAR | Status: AC
  Filled 2016-06-18: qty 5

## 2016-06-18 MED ORDER — LIDOCAINE 2% (20 MG/ML) 5 ML SYRINGE
INTRAMUSCULAR | Status: DC | PRN
  Administered 2016-06-18: 50 mg via INTRAVENOUS

## 2016-06-18 MED ORDER — LACTATED RINGERS IV BOLUS (SEPSIS): 1000.0000 mL | Freq: Three times a day (TID) | INTRAVENOUS | Status: DC | PRN

## 2016-06-18 MED ORDER — METHOCARBAMOL 500 MG PO TABS: 500.0000 mg | ORAL_TABLET | Freq: Four times a day (QID) | ORAL | Status: DC | PRN

## 2016-06-18 MED ORDER — MENTHOL 3 MG MT LOZG: 1.0000 | LOZENGE | OROMUCOSAL | Status: DC | PRN

## 2016-06-18 MED ORDER — SIMETHICONE 80 MG PO CHEW
40.0000 mg | CHEWABLE_TABLET | Freq: Four times a day (QID) | ORAL | Status: DC
  Administered 2016-06-18 – 2016-06-20 (×6): 40 mg via ORAL
  Filled 2016-06-18 (×6): qty 1

## 2016-06-18 MED ORDER — FENTANYL CITRATE (PF) 100 MCG/2ML IJ SOLN
INTRAMUSCULAR | Status: DC | PRN
  Administered 2016-06-18: 100 ug via INTRAVENOUS
  Administered 2016-06-18 (×3): 50 ug via INTRAVENOUS

## 2016-06-18 MED ORDER — BUPIVACAINE-EPINEPHRINE 0.25% -1:200000 IJ SOLN
INTRAMUSCULAR | Status: DC | PRN
  Administered 2016-06-18: 50 mL

## 2016-06-18 MED ORDER — DIPHENHYDRAMINE HCL 50 MG/ML IJ SOLN: 12.5000 mg | Freq: Four times a day (QID) | INTRAMUSCULAR | Status: DC | PRN

## 2016-06-18 MED ORDER — ALUM & MAG HYDROXIDE-SIMETH 200-200-20 MG/5ML PO SUSP: 30.0000 mL | Freq: Four times a day (QID) | ORAL | Status: DC | PRN

## 2016-06-18 MED ORDER — HYDROMORPHONE HCL 1 MG/ML IJ SOLN
0.5000 mg | INTRAMUSCULAR | Status: DC | PRN
  Administered 2016-06-18: 0.5 mg via INTRAVENOUS
  Administered 2016-06-18: 1 mg via INTRAVENOUS
  Filled 2016-06-18 (×2): qty 1

## 2016-06-18 MED ORDER — DEXAMETHASONE SODIUM PHOSPHATE 10 MG/ML IJ SOLN
INTRAMUSCULAR | Status: DC | PRN
  Administered 2016-06-18: 10 mg via INTRAVENOUS

## 2016-06-18 MED ORDER — PHENYLEPHRINE 40 MCG/ML (10ML) SYRINGE FOR IV PUSH (FOR BLOOD PRESSURE SUPPORT)
PREFILLED_SYRINGE | INTRAVENOUS | Status: AC
  Filled 2016-06-18: qty 10

## 2016-06-18 MED ORDER — ONDANSETRON HCL 4 MG/2ML IJ SOLN: 4.0000 mg | Freq: Four times a day (QID) | INTRAMUSCULAR | Status: DC | PRN

## 2016-06-18 MED ORDER — LACTATED RINGERS IV SOLN
INTRAVENOUS | Status: DC
  Administered 2016-06-18 – 2016-06-19 (×2): via INTRAVENOUS

## 2016-06-18 MED ORDER — ROCURONIUM BROMIDE 100 MG/10ML IV SOLN
INTRAVENOUS | Status: AC
  Filled 2016-06-18: qty 1

## 2016-06-18 MED ORDER — CLINDAMYCIN PHOSPHATE 600 MG/50ML IV SOLN
600.0000 mg | Freq: Four times a day (QID) | INTRAVENOUS | Status: DC
  Administered 2016-06-18 – 2016-06-20 (×8): 600 mg via INTRAVENOUS
  Filled 2016-06-18 (×9): qty 50

## 2016-06-18 MED ORDER — ONDANSETRON HCL 4 MG/2ML IJ SOLN
INTRAMUSCULAR | Status: DC | PRN
  Administered 2016-06-18: 4 mg via INTRAVENOUS

## 2016-06-18 MED ORDER — HYDROMORPHONE HCL 1 MG/ML IJ SOLN
INTRAMUSCULAR | Status: AC
  Filled 2016-06-18: qty 1

## 2016-06-18 MED ORDER — FLUTICASONE PROPIONATE 50 MCG/ACT NA SUSP
2.0000 | Freq: Every day | NASAL | Status: DC
  Filled 2016-06-18: qty 16

## 2016-06-18 MED ORDER — ACETAMINOPHEN 500 MG PO TABS
1000.0000 mg | ORAL_TABLET | ORAL | Status: AC
  Administered 2016-06-18: 1000 mg via ORAL
  Filled 2016-06-18: qty 2

## 2016-06-18 MED ORDER — PROMETHAZINE HCL 25 MG/ML IJ SOLN: 6.2500 mg | INTRAMUSCULAR | Status: DC | PRN

## 2016-06-18 MED ORDER — OXYCODONE HCL 5 MG PO TABS
5.0000 mg | ORAL_TABLET | ORAL | Status: DC | PRN
  Administered 2016-06-20: 5 mg via ORAL
  Filled 2016-06-18: qty 1

## 2016-06-18 MED ORDER — GABAPENTIN 300 MG PO CAPS
300.0000 mg | ORAL_CAPSULE | ORAL | Status: AC
  Administered 2016-06-18: 300 mg via ORAL
  Filled 2016-06-18: qty 1

## 2016-06-18 MED ORDER — DEXAMETHASONE SODIUM PHOSPHATE 10 MG/ML IJ SOLN
INTRAMUSCULAR | Status: AC
  Filled 2016-06-18: qty 1

## 2016-06-18 MED ORDER — POLYETHYLENE GLYCOL 3350 17 G PO PACK
17.0000 g | PACK | Freq: Every day | ORAL | Status: DC
  Administered 2016-06-19 – 2016-06-20 (×2): 17 g via ORAL
  Filled 2016-06-18 (×2): qty 1

## 2016-06-18 MED ORDER — PHENOL 1.4 % MT LIQD
2.0000 | OROMUCOSAL | Status: DC | PRN
  Filled 2016-06-18: qty 177

## 2016-06-18 MED ORDER — ALBUTEROL SULFATE (2.5 MG/3ML) 0.083% IN NEBU: 3.0000 mL | INHALATION_SOLUTION | RESPIRATORY_TRACT | Status: DC | PRN

## 2016-06-18 MED ORDER — MAGIC MOUTHWASH
15.0000 mL | Freq: Four times a day (QID) | ORAL | Status: DC | PRN
  Filled 2016-06-18: qty 15

## 2016-06-18 MED ORDER — PROMETHAZINE HCL 25 MG RE SUPP: 25.0000 mg | Freq: Four times a day (QID) | RECTAL | 5 refills | Status: DC | PRN

## 2016-06-18 MED ORDER — ACETAMINOPHEN 500 MG PO TABS
1000.0000 mg | ORAL_TABLET | Freq: Three times a day (TID) | ORAL | Status: DC
  Administered 2016-06-18 – 2016-06-20 (×5): 1000 mg via ORAL
  Filled 2016-06-18 (×5): qty 2

## 2016-06-18 MED ORDER — PHENYLEPHRINE HCL 10 MG/ML IJ SOLN
INTRAMUSCULAR | Status: DC | PRN
  Administered 2016-06-18 (×5): 80 ug via INTRAVENOUS

## 2016-06-18 MED ORDER — LIP MEDEX EX OINT
1.0000 "application " | TOPICAL_OINTMENT | Freq: Two times a day (BID) | CUTANEOUS | Status: DC
  Administered 2016-06-18 – 2016-06-20 (×4): 1 via TOPICAL
  Filled 2016-06-18: qty 7

## 2016-06-18 MED ORDER — ARTIFICIAL TEARS OP OINT
TOPICAL_OINTMENT | OPHTHALMIC | Status: AC
  Filled 2016-06-18: qty 3.5

## 2016-06-18 MED ORDER — METHOCARBAMOL 1000 MG/10ML IJ SOLN
1000.0000 mg | Freq: Four times a day (QID) | INTRAMUSCULAR | Status: DC | PRN
  Filled 2016-06-18 (×2): qty 10

## 2016-06-18 MED ORDER — METOCLOPRAMIDE HCL 5 MG/ML IJ SOLN
5.0000 mg | Freq: Four times a day (QID) | INTRAMUSCULAR | Status: DC | PRN
  Administered 2016-06-18 – 2016-06-19 (×2): 10 mg via INTRAVENOUS
  Filled 2016-06-18 (×2): qty 2

## 2016-06-18 MED ORDER — ASPIRIN EC 81 MG PO TBEC
81.0000 mg | DELAYED_RELEASE_TABLET | Freq: Every day | ORAL | Status: DC
  Administered 2016-06-18 – 2016-06-19 (×2): 81 mg via ORAL
  Filled 2016-06-18 (×2): qty 1

## 2016-06-18 MED ORDER — ARTIFICIAL TEARS OP OINT
TOPICAL_OINTMENT | OPHTHALMIC | Status: DC | PRN
  Administered 2016-06-18: 1 via OPHTHALMIC

## 2016-06-18 MED ORDER — BUPIVACAINE-EPINEPHRINE 0.25% -1:200000 IJ SOLN
INTRAMUSCULAR | Status: AC
  Filled 2016-06-18: qty 1

## 2016-06-18 MED ORDER — LACTATED RINGERS IV SOLN
INTRAVENOUS | Status: DC | PRN
  Administered 2016-06-18 (×3): via INTRAVENOUS

## 2016-06-18 MED ORDER — METOPROLOL TARTRATE 5 MG/5ML IV SOLN: 5.0000 mg | Freq: Four times a day (QID) | INTRAVENOUS | Status: DC | PRN

## 2016-06-18 MED ORDER — HYDRALAZINE HCL 20 MG/ML IJ SOLN: 5.0000 mg | Freq: Four times a day (QID) | INTRAMUSCULAR | Status: DC | PRN

## 2016-06-18 MED ORDER — BUPIVACAINE LIPOSOME 1.3 % IJ SUSP
INTRAMUSCULAR | Status: DC | PRN
  Administered 2016-06-18: 20 mL

## 2016-06-18 MED ORDER — SUGAMMADEX SODIUM 200 MG/2ML IV SOLN
INTRAVENOUS | Status: AC
  Filled 2016-06-18: qty 2

## 2016-06-18 MED ORDER — CLINDAMYCIN PHOSPHATE 900 MG/50ML IV SOLN
900.0000 mg | INTRAVENOUS | Status: AC
  Administered 2016-06-18: 900 mg via INTRAVENOUS
  Filled 2016-06-18: qty 50

## 2016-06-18 MED ORDER — BUPIVACAINE LIPOSOME 1.3 % IJ SUSP
20.0000 mL | Freq: Once | INTRAMUSCULAR | Status: DC
  Filled 2016-06-18: qty 20

## 2016-06-18 MED ORDER — ADULT MULTIVITAMIN W/MINERALS CH
1.0000 | ORAL_TABLET | Freq: Every day | ORAL | Status: DC
  Administered 2016-06-18 – 2016-06-19 (×2): 1 via ORAL
  Filled 2016-06-18 (×4): qty 1

## 2016-06-18 SURGICAL SUPPLY — 64 items
APPLIER CLIP 5 13 M/L LIGAMAX5 (MISCELLANEOUS)
APPLIER CLIP ROT 10 11.4 M/L (STAPLE)
APR CLP MED LRG 11.4X10 (STAPLE)
APR CLP MED LRG 5 ANG JAW (MISCELLANEOUS)
BLADE SURG SZ11 CARB STEEL (BLADE) ×4 IMPLANT
CHLORAPREP W/TINT 26ML (MISCELLANEOUS) ×4 IMPLANT
CLIP APPLIE 5 13 M/L LIGAMAX5 (MISCELLANEOUS) IMPLANT
CLIP APPLIE ROT 10 11.4 M/L (STAPLE) IMPLANT
COVER TIP SHEARS 8 DVNC (MISCELLANEOUS) ×2 IMPLANT
COVER TIP SHEARS 8MM DA VINCI (MISCELLANEOUS) ×2
DECANTER SPIKE VIAL GLASS SM (MISCELLANEOUS) ×4 IMPLANT
DEVICE TROCAR PUNCTURE CLOSURE (ENDOMECHANICALS) ×2 IMPLANT
DRAIN CHANNEL 19F RND (DRAIN) IMPLANT
DRAIN PENROSE 18X1/2 LTX STRL (DRAIN) ×2 IMPLANT
DRAPE ARM DVNC X/XI (DISPOSABLE) ×8 IMPLANT
DRAPE COLUMN DVNC XI (DISPOSABLE) ×2 IMPLANT
DRAPE DA VINCI XI ARM (DISPOSABLE) ×8
DRAPE DA VINCI XI COLUMN (DISPOSABLE) ×2
DRSG TEGADERM 2-3/8X2-3/4 SM (GAUZE/BANDAGES/DRESSINGS) ×19 IMPLANT
DRSG TEGADERM 4X4.75 (GAUZE/BANDAGES/DRESSINGS) ×2 IMPLANT
ELECT REM PT RETURN 15FT ADLT (MISCELLANEOUS) ×4 IMPLANT
ENDOLOOP SUT PDS II  0 18 (SUTURE)
ENDOLOOP SUT PDS II 0 18 (SUTURE) IMPLANT
EVACUATOR SILICONE 100CC (DRAIN) IMPLANT
FELT TEFLON 4 X1 (Mesh General) ×2 IMPLANT
GAUZE SPONGE 2X2 8PLY STRL LF (GAUZE/BANDAGES/DRESSINGS) ×2 IMPLANT
GLOVE ECLIPSE 8.0 STRL XLNG CF (GLOVE) ×8 IMPLANT
GLOVE INDICATOR 8.0 STRL GRN (GLOVE) ×8 IMPLANT
GOWN STRL REUS W/TWL XL LVL3 (GOWN DISPOSABLE) ×18 IMPLANT
IRRIG SUCT STRYKERFLOW 2 WTIP (MISCELLANEOUS) ×4
IRRIGATION SUCT STRKRFLW 2 WTP (MISCELLANEOUS) ×2 IMPLANT
KIT BASIN OR (CUSTOM PROCEDURE TRAY) ×4 IMPLANT
MESH PHASIX ST 10X15 (Mesh General) ×2 IMPLANT
NDL INSUFFLATION 14GA 120MM (NEEDLE) ×1 IMPLANT
NEEDLE HYPO 22GX1.5 SAFETY (NEEDLE) ×4 IMPLANT
NEEDLE INSUFFLATION 14GA 120MM (NEEDLE) ×4 IMPLANT
PACK CARDIOVASCULAR III (CUSTOM PROCEDURE TRAY) ×4 IMPLANT
PAD POSITIONING PINK XL (MISCELLANEOUS) ×4 IMPLANT
SCISSORS LAP 5X35 DISP (ENDOMECHANICALS) ×4 IMPLANT
SEAL CANN UNIV 5-8 DVNC XI (MISCELLANEOUS) ×8 IMPLANT
SEAL XI 5MM-8MM UNIVERSAL (MISCELLANEOUS) ×8
SEALER VESSEL DA VINCI XI (MISCELLANEOUS) ×2
SEALER VESSEL EXT DVNC XI (MISCELLANEOUS) ×2 IMPLANT
SET BI-LUMEN FLTR TB AIRSEAL (TUBING) ×4 IMPLANT
SOLUTION ELECTROLUBE (MISCELLANEOUS) ×4 IMPLANT
SPONGE GAUZE 2X2 STER 10/PKG (GAUZE/BANDAGES/DRESSINGS) ×2
SPONGE LAP 18X18 X RAY DECT (DISPOSABLE) ×4 IMPLANT
SUT ETHIBOND 0 36 GRN (SUTURE) ×18 IMPLANT
SUT ETHIBOND NAB CT1 #1 30IN (SUTURE) ×8 IMPLANT
SUT MNCRL AB 4-0 PS2 18 (SUTURE) ×4 IMPLANT
SUT PDS AB 1 CT1 27 (SUTURE) ×3 IMPLANT
SUT PROLENE 2 0 SH DA (SUTURE) IMPLANT
SUT VICRYL 0 UR6 27IN ABS (SUTURE) ×4 IMPLANT
SYR 20CC LL (SYRINGE) ×4 IMPLANT
SYRINGE 10CC LL (SYRINGE) ×4 IMPLANT
TIP INNERVISION DETACH 40FR (MISCELLANEOUS) IMPLANT
TIP INNERVISION DETACH 50FR (MISCELLANEOUS) IMPLANT
TIP INNERVISION DETACH 56FR (MISCELLANEOUS) IMPLANT
TIPS INNERVISION DETACH 40FR (MISCELLANEOUS)
TOWEL OR 17X26 10 PK STRL BLUE (TOWEL DISPOSABLE) ×4 IMPLANT
TOWEL OR NON WOVEN STRL DISP B (DISPOSABLE) ×4 IMPLANT
TRAY FOLEY W/METER SILVER 14FR (SET/KITS/TRAYS/PACK) ×2 IMPLANT
TRAY FOLEY W/METER SILVER 16FR (SET/KITS/TRAYS/PACK) IMPLANT
TROCAR ADV FIXATION 5X100MM (TROCAR) ×4 IMPLANT

## 2016-06-18 NOTE — Anesthesia Procedure Notes (Signed)
Procedure Name: Intubation Date/Time: 06/18/2016 7:29 AM Performed by: Talbot Grumbling Pre-anesthesia Checklist: Patient identified, Emergency Drugs available, Suction available and Patient being monitored Patient Re-evaluated:Patient Re-evaluated prior to inductionOxygen Delivery Method: Circle system utilized Preoxygenation: Pre-oxygenation with 100% oxygen Intubation Type: IV induction Ventilation: Mask ventilation without difficulty Laryngoscope Size: Miller and 2 Grade View: Grade II Tube type: Oral Tube size: 7.0 mm Number of attempts: 1 Airway Equipment and Method: Stylet Placement Confirmation: ETT inserted through vocal cords under direct vision,  positive ETCO2 and breath sounds checked- equal and bilateral Secured at: 21 cm Tube secured with: Tape Dental Injury: Teeth and Oropharynx as per pre-operative assessment

## 2016-06-18 NOTE — Anesthesia Postprocedure Evaluation (Signed)
Anesthesia Post Note  Patient: Karen Delacruz  Procedure(s) Performed: Procedure(s) (LRB): XI ROBOTIC PARAESOPHAGEAL HIATAL HERNIA WITH MESH FUNDOPLICATION (N/A) INSERTION OF MESH (N/A)  Patient location during evaluation: PACU Anesthesia Type: General Level of consciousness: awake and alert, awake and sedated Pain management: pain level controlled Vital Signs Assessment: post-procedure vital signs reviewed and stable Respiratory status: spontaneous breathing, nonlabored ventilation, respiratory function stable and patient connected to nasal cannula oxygen Cardiovascular status: blood pressure returned to baseline and stable Postop Assessment: no signs of nausea or vomiting Anesthetic complications: no    Last Vitals:  Vitals:   06/18/16 1230 06/18/16 1245  BP: 132/74 140/87  Pulse: 88 84  Resp: 14 19  Temp:      Last Pain:  Vitals:   06/18/16 1200  TempSrc:   PainSc: Asleep                 Hasson Gaspard,JAMES TERRILL

## 2016-06-18 NOTE — Op Note (Signed)
06/18/2016  11:45 AM  PATIENT:  Karen Delacruz  75 y.o. female  Patient Care Team: Tammi Sou, MD as PCP - General (Family Medicine) Rolan Bucco, MD (Inactive) as Consulting Physician (Urology) Michael Boston, MD as Consulting Physician (General Surgery) Mauri Pole, MD as Consulting Physician (Gastroenterology)  PRE-OPERATIVE DIAGNOSIS:  paraesophageal hiatal hernia refractory to medical management  POST-OPERATIVE DIAGNOSIS:  Paraesophageal hiatal hernia refractory to medical management  PROCEDURE:    1. XI ROBOTIC reduction of paraesophageal hiatal hernia 2. Type II mediastinal dissection. 3. Primary repair of hiatal hernia over pledgets. 4 REINFORCEMENT OF REPAIR WITH PHASIX (ABSORBABLE) MESH  5. Anterior & posterior gastropexy. 6. Nissen fundoplication, 2 cm over a 56-French bougie   SURGEON:  Surgeon(s): Michael Boston, MD Leighton Ruff, MD - Assist  ANESTHESIA:   local and general  EBL:  Total I/O In: 2600 [I.V.:2600] Out: 200 [Urine:150; Blood:50]  Delay start of Pharmacological VTE agent (>24hrs) due to surgical blood loss or risk of bleeding:  no  ANESTHESIA: 1. General anesthesia. 2. Local anesthetic in a field block around all port sites.  SPECIMEN:  Mediastinal hernia sac (not sent).  DRAINS:  A 19-French Blake drain goes from the right upper quadrant along the lesser curvature of the stomach into the mediastinum.  COUNTS:  YES  PLAN OF CARE: Admit for overnight observation  PATIENT DISPOSITION:  PACU - hemodynamically stable.  INDICATION:   Patient with symptomatic paraesophageal hiatal hernia.  The patient has had extensive work-up & we feel the patient will benefit from repair:  The anatomy & physiology of the foregut and anti-reflux mechanism was discussed.  The pathophysiology of hiatal herniation and GERD was discussed.  Natural history risks without surgery was discussed.   The patient's symptoms are not adequately controlled by  medicines and other non-operative treatments.  I feel the risks of no intervention will lead to serious problems that outweigh the operative risks; therefore, I recommended surgery to reduce the hiatal hernia out of the chest and fundoplication to rebuild the anti-reflux valve and control reflux better.  Need for a thorough workup to rule out the differential diagnosis and plan treatment was explained.  I explained laparoscopic techniques with possible need for an open approach.  Risks such as bleeding, infection, abscess, leak, need for further treatment, heart attack, death, and other risks were discussed.   I noted a good likelihood this will help address the problem.  Goals of post-operative recovery were discussed as well.  Possibility that this will not correct all symptoms was explained.  Post-operative dysphagia, need for short-term liquid & pureed diet, inability to vomit, possibility of reherniation, possible need for medicines to help control symptoms in addition to surgery were discussed.  We will work to minimize complications.   Educational handouts further explaining the pathology, treatment options, and dysphagia diet was given as well.  Questions were answered.  The patient expresses understanding & wishes to proceed with surgery.  OR FINDINGS:   Moderate-sized paraesophageal hiatal hernia with 40% of the stomach in the mediastinum.  Also splenic flexure of the colon up in the mediastinum.  There was a 15 x 12 cm hiatal defect.  It is a primary repair over pledgets.   Reinforcement with absorbable phase 6 mesh.  10 x 15 cm.  [place] diaphragm with U shape cut to allow esophagus and fundoplication defect to come through diaphragm.  Narrow arm of the U along right crus.  Brought her arm of the U along  the left crus and diaphragm   The patient has a 2 cm Nissen fundoplication that was done over 56-French bougie.  The patient has had anterior and posterior gastropexy.  DESCRIPTION:    Informed consent was confirmed.  The patient received IV antibiotics prior to incision.  The underwent general anesthesia without difficulty.  A Foley catheter sterilely placed.  The patient was positioned in split leg with arms tucked. The abdomen was prepped and draped in the sterile fashion.  Surgical time-out confirmed our plan.   The patient was positioned in reverse Trendelenburg.  Abdominal entry was gained using Varess technique with a trach hook on the anterior abdominal wall fascia in the left right upper abdomen.  Entry was clean.  I induced carbon dioxide insufflation.  Camera inspection revealed no injury.  Extra ports were carefully placed under direct laparoscopic visualization.   The patient was carefully positioned.  I also placed a 5 mm port in the left subxiphoid region under direct visualization.  I removed that and placed an Omega-shaped rigid Nathanson liver retractor to lift the left lateral sector of the liver anteriorly to expose the esophageal hiatus.  This was secured to the bed using the iron man system.The XI Intuitive daVinci robot was carefully docked with camera & instruments carefully placed.  We grasped the anterior mediastinal sac at the apex of the crus.  I scored through that and got into the anterior mediastinum.  I was able to free the mediastinal sac from its attachments to the pericardium and bilateral pleura using primarily focused gentle blunt dissection as well as focused ultrasonic Harmonic dissection.  I transected phrenoesophageal attachments to the inner right crus, preserving a two centimeter cuff of mediastinal sac until I found the base of the crura.  I then came around anteriorly on the left side and freed up the phrenoesophageal attachments of the mediastinal sac on the medial part of the left crus on the superior half.  I did careful mediastinal dissection to free the mediastinal sac.  With that, we could relieve the suction cup affect of the hernia sac  and help reduce the stomach back down into the abdomen, flipped back approriately.  The splenic flexure of the colon was up into the hiatal hernia and then gradually reduced down as well.  We ligated the short gastrics along the lesser curvature of the stomach about a  third way down and then came up proximally over the fundus.  We released the attachments of the stomach to the retroperitoneum until we were able to connect with the prior dissection on the left crus.  We completed the release of phrenoesophageal attachments to the medial part of the left crus down to its base.  With this, we had circumferential mobilization.    We placed the stomach and esophagus on axial tension.  I then did a Type II mediastinal dissection where I freed the esophagus from its attachments to the aorta, spine, pleura, and pericardium using primarily gentle blunt as well as focused ultrasonic dissection.  We saw the anterior & posterior vagus nerves intact.  We preserved it at all times.  I procedded to dissect about 20 cm proximally into the mediastinum.  With that I could straighten out the esophagus and get 2 cm of intra-abdominal length of the esophagus at a best estimation.  Esophagus was four foreshortened than usual.  I freed the anterior mediastinal sac off the esophagus & stomach.  We saw the anterior vagus nerve and freed the  sac off of the vagus.  I dissected out & removed the fatty epiphrenic pads at the esophagogastric junction. With that, I could better define the esophagogastric junction.  I confirmed the the patient had 3 cm of intra-abdominal esophageal length off tension.  I brought the fundus of the stomach posterior to the esophagus over to the right side.  The wrap was mobile with the classic shoe shine maneuver.  Wrap became together gently.  We reflected the stomach left laterally and closed the esophageal hiatus using 0 Ethibond stitch using horizontal mattress stitches with pledgets on both sides.  I  did that x5 stitches since it was a larger defect..  The crura had good substance and they came together well without any tension.  However because the defect was larger than expected and her obesity, I decided to mesh reinforcement.  I used absorbable 10x15cm Phasix Mesh (lnitted monofilament mesh scaffold using Poly-4-hydroxybutyrate (P4HB), a biologically derived, fully resorbable material).  We cut a defect in the mesh such that it looked like a U with a narrow arm and a brought her arm.  He was hydrated rolled and placed into the perineal cavity.  Laid the rough side of the face six mesh onto the diaphragm.  The more narrow arm went up along the right crus anteriorly.  I tunneled the more broad arm of the mesh behind the esophagus so that it could lay along the left crus and diaphragm superior to the spleen.  I stitched the medial and lateral corners of each arm using 0 Ethibond suture superiorly and inferiorly.  I reset up the and Nissen wrap and did a posterior gastropexy by taking of #1 Ethibond stitches to the posterior part of the right side of the wrap and thru the base of the U part of the Phasix mesh including the crural closure and tied that down for good posterior gastropexy up against the hiatal closure.   The distal 2 sutures.  This helped the wrap for completion.    Next, Anesthesia passed a 56-French bougie transorally.  It was a lighted bougie and we did do this under axial tension.  It passed down easily without resistance.    I then did a classic 2 cm Nissen fundoplication on the true esophagus above the cardia using Ethibond stitch in the left side of the wrap, then anterior esophagus, and then right side of the wrap and tied that down. Did 3 stitches.  The most cephalad pair of sutures on the left and right were placed through the absorbable mesh and current as well to provide a good anterior gastropexy on both sides.  I measured it and it was 2 cm in length.  We removed the bougie.   It was intact.   The wrap was soft and floppy.  I placed a drain as noted above.  I did irrigation and ensured hemostasis.  I saw no evidence of any leak or perforation or other abnormality.  I removed the [facility] liver retractor under direct visualization.   Drain placed.  I evacuated carbon dioxide and removed the ports.  The skin was closed with Monocryl and sterile dressings applied.  The patient is being extubated and brought back to the recovery room.  I discussed postop care in detail with the patient and family in in the office.  Discussed again with the patient in the holding area.  I am about to discuss it with the family as well.  Adin Hector, M.D., F.A.C.S. Gastrointestinal  and Minimally Invasive Surgery [facility] [address], [place], [place] [address] [phone] Main / Paging

## 2016-06-18 NOTE — Transfer of Care (Signed)
Immediate Anesthesia Transfer of Care Note  Patient: Karen Delacruz  Procedure(s) Performed: Procedure(s): XI ROBOTIC PARAESOPHAGEAL HIATAL HERNIA WITH MESH FUNDOPLICATION (N/A) INSERTION OF MESH (N/A)  Patient Location: PACU  Anesthesia Type:General  Level of Consciousness:  sedated, patient cooperative and responds to stimulation  Airway & Oxygen Therapy:Patient Spontanous Breathing and Patient connected to face mask oxgen  Post-op Assessment:  Report given to PACU RN and Post -op Vital signs reviewed and stable  Post vital signs:  Reviewed and stable  Last Vitals:  Vitals:   06/18/16 0530  BP: (!) 147/87  Pulse: 75  Resp: 16  Temp: [id number] C    Complications: No apparent anesthesia complications

## 2016-06-18 NOTE — Interval H&P Note (Signed)
History and Physical Interval Note:  06/18/2016 6:50 AM  Karen Delacruz  has presented today for surgery, with the diagnosis of paraesophageal hiatal hernia refractory to medical management  The various methods of treatment have been discussed with the patient and family. After consideration of risks, benefits and other options for treatment, the patient has consented to  Procedure(s): XI ROBOTIC PARAESOPHAGEAL HIATAL HERNIA WITH MESH FUNDOPLICATION (N/A) INSERTION OF MESH (N/A) as a surgical intervention .  The patient's history has been reviewed, patient examined, no change in status, stable for surgery.  I have reviewed the patient's chart and labs.  Questions were answered to the patient's satisfaction.     [name] C.

## 2016-06-18 NOTE — Progress Notes (Signed)
Pt has noted scratches on left lower leg. No drainage noted.

## 2016-06-19 ENCOUNTER — Inpatient Hospital Stay (HOSPITAL_COMMUNITY): Payer: Medicare Other

## 2016-06-19 LAB — CBC
HCT: 40.1 % (ref 36.0–46.0)
Hemoglobin: 13.7 g/dL (ref 12.0–15.0)
MCH: 32.2 pg (ref 26.0–34.0)
MCHC: 34.2 g/dL (ref 30.0–36.0)
MCV: 94.1 fL (ref 78.0–100.0)
PLATELETS: 67 10*3/uL — AB (ref 150–400)
RBC: 4.26 MIL/uL (ref 3.87–5.11)
RDW: 12.8 % (ref 11.5–15.5)
WBC: 12.5 10*3/uL — ABNORMAL HIGH (ref 4.0–10.5)

## 2016-06-19 LAB — BASIC METABOLIC PANEL
Anion gap: 6 (ref 5–15)
BUN: 10 mg/dL (ref 6–20)
CHLORIDE: 100 mmol/L — AB (ref 101–111)
CO2: 30 mmol/L (ref 22–32)
CREATININE: 0.59 mg/dL (ref 0.44–1.00)
Calcium: 8.9 mg/dL (ref 8.9–10.3)
GFR calc non Af Amer: >60 mL/min (ref >60)
GLUCOSE: 133 mg/dL — AB (ref 65–99)
Potassium: 4.6 mmol/L (ref 3.5–5.1)
Sodium: 136 mmol/L (ref 135–145)

## 2016-06-19 LAB — MAGNESIUM: Magnesium: 1.6 mg/dL — ABNORMAL LOW (ref 1.7–2.4)

## 2016-06-19 MED ORDER — DIATRIZOATE MEGLUMINE & SODIUM 66-10 % PO SOLN
30.0000 mL | Freq: Once | ORAL | Status: AC
  Administered 2016-06-19: 60 mL via ORAL

## 2016-06-19 NOTE — Progress Notes (Signed)
1 Day Post-Op robotic hiatal hernia repair with mesh Subjective: C/o LUQ pain, no nausea, swallowing well  Objective: Vital signs in last 24 hours: Temp:  [97.4 F (36.3 C)-98.9 F (37.2 C)] 98 F (36.7 C) (08/26 0530) Pulse Rate:  [66-94] 85 (08/26 0530) Resp:  [13-26] 18 (08/26 0530) BP: (109-162)/(74-117) 150/91 (08/26 0530) SpO2:  [87 %-99 %] 95 % (08/26 0530)   Intake/Output from previous day: 08/25 0701 - 08/26 0700 In: 3561.7 [I.V.:3561.7] Out: 2513 [Urine:2350; Drains:113; Blood:50] Intake/Output this shift: No intake/output data recorded.   General appearance: alert and cooperative GI: soft  Incision: no significant erythema  Lab Results:   Recent Labs  06/19/16 0438  WBC 12.5*  HGB 13.7  HCT 40.1  PLT 67*   BMET  Recent Labs  06/19/16 0438  NA 136  K 4.6  CL 100*  CO2 30  GLUCOSE 133*  BUN 10  CREATININE 0.59  CALCIUM 8.9   PT/INR No results for input(s): LABPROT, INR in the last 72 hours. ABG No results for input(s): PHART, HCO3 in the last 72 hours.  Invalid input(s): PCO2, PO2  MEDS, Scheduled . acetaminophen  1,000 mg Oral Q8H  . aspirin EC  81 mg Oral QHS  . clindamycin (CLEOCIN) IV  600 mg Intravenous Q6H  . DULoxetine  30 mg Oral QHS  . enoxaparin (LOVENOX) injection  40 mg Subcutaneous Q24H  . fluticasone  2 spray Each Nare Daily  . lip balm  1 application Topical BID  . multivitamin with minerals  1 tablet Oral QHS  . polyethylene glycol  17 g Oral Daily  . simethicone  40 mg Oral QID    Studies/Results: No results found.  Assessment: s/p Procedure(s): XI ROBOTIC PARAESOPHAGEAL HIATAL HERNIA WITH MESH FUNDOPLICATION INSERTION OF MESH Patient Active Problem List   Diagnosis Date Noted  . Preoperative evaluation to rule out surgical contraindication   . Paraesophageal hiatal hernia 03/29/2016  . Diverticulosis of sigmoid colon 03/29/2016  . Vitamin B12 deficiency 02/17/2016  . Maxillary sinusitis 10/07/2015  .  Fibromyalgia 08/21/2014  . GERD (gastroesophageal reflux disease) 08/21/2014  . Hyperlipidemia 08/21/2014  . Preventative health care 08/21/2014  . Left shoulder pain 12/31/2013  . Breast cancer screening 12/31/2013  . Colon cancer screening 04/06/2013  . Paresthesias in right hand 12/22/2012  . Trigger point of right side of body 12/22/2012  . Varicose veins 09/04/2012  . Right knee pain 08/17/2012  . Myalgia 07/31/2012  . Trigger point with neck pain 07/31/2012  . SYNOVITIS, ANKLE 05/13/2008  . TENOSYNOVITIS OF FOOT AND ANKLE 05/13/2008    Expected post op course  Plan: UGI this am  If neg for leak, can advance diet to pureed foods Anticipate d/c in AM   LOS: 1 day     .Rosario Adie, [facility], [place]   06/19/2016 8:47 AM

## 2016-06-20 NOTE — Plan of Care (Signed)
Problem: Food- and Nutrition-Related Knowledge Deficit (NB-1.1) Goal: Nutrition education Formal process to instruct or train a patient/client in a skill or to impart knowledge to help patients/clients voluntarily manage or modify food choices and eating behavior to maintain or improve health.  Outcome: Completed/Met Date Met: 06/20/16 Nutrition Education Note  RD consulted for nutrition education regarding patient s/p gastric fundoplication. Pt is to follow a pureed diet for 2-3 weeks post-op until dysphagia and wrap edema resolves.  RD provided "Fundoplication" handout. Reviewed liquid and pureed diet. Encouraged pt to consume protein supplements during this stage in the diet.  RD discussed strategies to avoid stretching and gas producing foods. Teach back method used.  Expect good compliance. Husband at bedside.   Body mass index is 26.76 kg/m. Pt meets criteria for overweight based on current BMI.  Current diet order is clear liquids, patient is consuming approximately 25% of meals at this time. Labs and medications reviewed. No further nutrition interventions warranted at this time. If additional nutrition issues arise, please re-consult RD.   Karen Bibles, MS, RD, LDN Pager: [phone] After Hours Pager: [phone]

## 2016-06-20 NOTE — Discharge Summary (Signed)
Physician Discharge Summary  Patient ID: YOSELIN PEED MRN: [id number] DOB/AGE: [birth year] 75 y.o.  Admit date: 06/18/2016 Discharge date: 06/20/2016  Admission Diagnoses:  Discharge Diagnoses:  Active Problems:   Paraesophageal hiatal hernia   Discharged Condition: good  Hospital Course: Patient admitted after surgery.  She underwent an upper GI the following day.  This showed no leak.  Patient's diet advanced.  She was doing well by POD 2 and discharged home.  Consults: None  Significant Diagnostic Studies: labs: cbc, chemistry  Treatments: IV hydration, analgesia: acetaminophen w/ codeine and surgery: robotic Nissen  Discharge Exam: Blood pressure 117/79, pulse 100, temperature 98.1 F (36.7 C), temperature source Oral, resp. rate 18, height 5\' 8"  (1.727 m), weight 79.8 kg (176 lb), SpO2 92 %. General appearance: alert and cooperative GI: normal findings: soft, non-tender Incision/Wound: clean, dry JP: SS fluid  Disposition: 01-Home or Self Care  Discharge Instructions    Call MD for:    Complete by:  As directed   Temperature > 101.62F   Call MD for:  extreme fatigue    Complete by:  As directed   Call MD for:  hives    Complete by:  As directed   Call MD for:  persistant nausea and vomiting    Complete by:  As directed   Call MD for:  redness, tenderness, or signs of infection (pain, swelling, redness, odor or green/yellow discharge around incision site)    Complete by:  As directed   Call MD for:  severe uncontrolled pain    Complete by:  As directed   Diet general    Complete by:  As directed   See Esophageal surgery handout.  In general, most people need to drink liquids and eat pured diet for the first couple of weeks.  Then as the swelling of the wrap and the hiatal hernia repair goes away, the diet can gradually advanced through soft foods to a usual diet over the next month or so.   Discharge instructions    Complete by:  As directed   Please see discharge  instruction sheets.   Also refer to any handouts/printouts that may have been given from the [facility] surgery office (if you visited [place] there before surgery) Please call our office if you have any questions or concerns [phone]   Discharge wound care:    Complete by:  As directed   If you have closed incisions: Shower and bathe over these incisions with soap and water every day.  It is OK to wash over the dressings: they are waterproof. Remove all surgical dressings on postoperative day #3.  You do not need to replace dressings over the closed incisions unless you feel more comfortable with a Band-Aid covering it.   If you have an open wound: That requires packing, so please see wound care instructions.   In general, remove all dressings, wash wound with soap and water and then replace with saline moistened gauze.  Do the dressing change at least every day.    Please call our office [phone] if you have further questions.   Driving Restrictions    Complete by:  As directed   No driving until off narcotics and can safely swerve away without pain during an emergency   Increase activity slowly    Complete by:  As directed   Lifting restrictions    Complete by:  As directed   Avoid heavy lifting initially, <20 pounds at first.   Do not push  through pain.   You have no specific weight limit: If it hurts to do, DON'T DO IT.    If you feel no pain, you are not injuring anything.  Pain will protect you from injury.   Coughing and sneezing are far more stressful to your incision than any lifting.   Avoid resuming heavy lifting (>50 pounds) or other intense activity until off all narcotic pain medications.   When want to exercise more, give yourself 2 weeks to gradually get back to full intense exercise/activity.   May shower / Bathe    Complete by:  As directed   [place].  It is fine for dressings or wounds to be washed/rinsed.  Use gentle soap & water.  This will help the incisions  and/or wounds get clean & minimize infection.   May walk up steps    Complete by:  As directed   Sexual Activity Restrictions    Complete by:  As directed   Sexual activity as tolerated.  Do not push through pain.  Pain will protect you from injury.   Walk with assistance    Complete by:  As directed   Walk over an hour a day.  May use a walker/cane/companion to help with balance and stamina.       Medication List    TAKE these medications   albuterol 108 (90 Base) MCG/ACT inhaler Commonly known as:  VENTOLIN HFA Inhale 1-2 puffs into the lungs every 4 (four) hours as needed for wheezing or shortness of breath.   aspirin EC 81 MG tablet Take 81 mg by mouth at bedtime.   cyanocobalamin 1000 MCG/ML injection Commonly known as:  (VITAMIN B-12) Inject 1 mL (1,000 mcg total) into the muscle once. What changed:  when to take this   DULoxetine 30 MG capsule Commonly known as:  CYMBALTA Take 1 capsule (30 mg total) by mouth daily. What changed:  when to take this   fexofenadine 60 MG tablet Commonly known as:  ALLEGRA Take 1 tablet (60 mg total) by mouth 2 (two) times daily.   fluticasone 0.05 % cream Commonly known as:  CUTIVATE Apply to affected areas bid prn   fluticasone 50 MCG/ACT nasal spray Commonly known as:  FLONASE Place 2 sprays into both nostrils daily. What changed:  when to take this  reasons to take this   metoCLOPramide 10 MG tablet Commonly known as:  REGLAN Take 1 tablet (10 mg total) by mouth every 6 (six) hours as needed for nausea (nausea / vomiting).   multivitamin tablet Take 1 tablet by mouth at bedtime.   oxyCODONE 5 MG immediate release tablet Commonly known as:  Oxy IR/ROXICODONE Take 1-2 tablets (5-10 mg total) by mouth every 6 (six) hours as needed for moderate pain, severe pain or breakthrough pain.   predniSONE 20 MG tablet Commonly known as:  DELTASONE 2 tabs po qd x 5d, then 1 tab po qd x 5d, then 1/2 tab po qd x 6d, then stop    promethazine 25 MG suppository Commonly known as:  PHENERGAN Place 1 suppository (25 mg total) rectally every 6 (six) hours as needed for nausea.      Follow-up Information    Odis Hollingshead, MD.   Specialty:  General Surgery Contact information: [address] [place] [address] [phone]        Adin Hector., MD. Schedule an appointment as soon as possible for a visit in 3 week(s).   Specialty:  General Surgery Why:  To follow up after your operation, To follow up after your hospital stay Contact information: [place] [place] [address] [phone]           Signed: Rosario Adie. [id number], [id number] AM

## 2016-06-20 NOTE — Discharge Instructions (Signed)
LAPAROSCOPIC SURGERY: POST OP INSTRUCTIONS  ######################################################################  EAT Gradually transition to a high fiber diet with a fiber supplement over the next few weeks after discharge.  Start with a pureed / full liquid diet (see below)  WALK Walk an hour a day.  Control your pain to do that.    CONTROL PAIN Control pain so that you can walk, sleep, tolerate sneezing/coughing, go up/down stairs.  HAVE A BOWEL MOVEMENT DAILY Keep your bowels regular to avoid problems.  OK to try a laxative to override constipation.  OK to use an antidairrheal to slow down diarrhea.  Call if not better after 2 tries  CALL IF YOU HAVE PROBLEMS/CONCERNS Call if you are still struggling despite following these instructions. Call if you have concerns not answered by these instructions  ######################################################################    1. DIET: Follow a light bland diet the first 24 hours after arrival home, such as soup, liquids, crackers, etc.  Be sure to include lots of fluids daily.  Avoid fast food or heavy meals as your are more likely to get nauseated.  Eat a low fat the next few days after surgery.   2. Take your usually prescribed home medications unless otherwise directed. 3. PAIN CONTROL: a. Pain is best controlled by a usual combination of three different methods TOGETHER: i. Ice/Heat ii. Over the counter pain medication iii. Prescription pain medication b. Most patients will experience some swelling and bruising around the incisions.  Ice packs or heating pads (30-60 minutes up to 6 times a day) will help. Use ice for the first few days to help decrease swelling and bruising, then switch to heat to help relax tight/sore spots and speed recovery.  Some people prefer to use ice alone, heat alone, alternating between ice & heat.  Experiment to what works for you.  Swelling and bruising can take several weeks to resolve.   c. It is  helpful to take an over-the-counter pain medication regularly for the first few weeks.  Choose one of the following that works best for you: i. Naproxen (Aleve, etc)  Two 251m tabs twice a day ii. Ibuprofen (Advil, etc) Three 2053mtabs four times a day (every meal & bedtime) iii. Acetaminophen (Tylenol, etc) 500-65044mour times a day (every meal & bedtime) d. A  prescription for pain medication (such as oxycodone, hydrocodone, etc) should be given to you upon discharge.  Take your pain medication as prescribed.  i. If you are having problems/concerns with the prescription medicine (does not control pain, nausea, vomiting, rash, itching, etc), please call us [phone] see if we need to switch you to a different pain medicine that will work better for you and/or control your side effect better. ii. If you need a refill on your pain medication, please contact your pharmacy.  They will contact our office to request authorization. Prescriptions will not be filled after 5 pm or on week-ends. 4. Avoid getting constipated.  Between the surgery and the pain medications, it is common to experience some constipation.  Increasing fluid intake and taking a fiber supplement (such as Metamucil, Citrucel, FiberCon, MiraLax, etc) 1-2 times a day regularly will usually help prevent this problem from occurring.  A mild laxative (prune juice, Milk of Magnesia, MiraLax, etc) should be taken according to package directions if there are no bowel movements after 48 hours.   5. Watch out for diarrhea.  If you have many loose bowel movements, simplify your diet to bland foods & liquids for  a few days.  Stop any stool softeners and decrease your fiber supplement.  Switching to mild anti-diarrheal medications (Kayopectate, Pepto Bismol) can help.  If this worsens or does not improve, please call us. 6. Wash / shower every day.  You may shower over the dressings as they are waterproof.  Continue to shower over incision(s)  after the dressing is off. 7. Remove your waterproof bandages 5 days after surgery.  You may leave the incision open to air.  You may replace a dressing/Band-Aid to cover the incision for comfort if you wish.  8. ACTIVITIES as tolerated:   a. You may resume regular (light) daily activities beginning the next day--such as daily self-care, walking, climbing stairs--gradually increasing activities as tolerated.  If you can walk 30 minutes without difficulty, it is safe to try more intense activity such as jogging, treadmill, bicycling, low-impact aerobics, swimming, etc. b. Save the most intensive and strenuous activity for last such as sit-ups, heavy lifting, contact sports, etc  Refrain from any heavy lifting or straining until you are off narcotics for pain control.   c. DO NOT PUSH THROUGH PAIN.  Let pain be your guide: If it hurts to do something, don't do it.  Pain is your body warning you to avoid that activity for another week until the pain goes down. d. You may drive when you are no longer taking prescription pain medication, you can comfortably wear a seatbelt, and you can safely maneuver your car and apply brakes. e. Dennis Bast may have sexual intercourse when it is comfortable.  9. FOLLOW UP in our office a. Please call [facility] at [phone] to set up an appointment to see your surgeon in the office for a follow-up appointment approximately 2-3 weeks after your surgery. b. Make sure that you call for this appointment the day you arrive home to insure a convenient appointment time. 10. IF YOU HAVE DISABILITY OR FAMILY LEAVE FORMS, BRING THEM TO THE OFFICE FOR PROCESSING.  DO NOT GIVE THEM TO YOUR DOCTOR.   WHEN TO CALL us [phone]: 1. Poor pain control 2. Reactions / problems with new medications (rash/itching, nausea, etc)  3. Fever over 101.5 F (38.5 C) 4. Inability to urinate 5. Nausea and/or vomiting 6. Worsening swelling or bruising 7. Continued bleeding from incision. 8. Increased  pain, redness, or drainage from the incision   The clinic staff is available to answer your questions during regular business hours (8:30am-5pm).  Please dont hesitate to call and ask to speak to one of our nurses for clinical concerns.   If you have a medical emergency, go to the nearest emergency room or call 911.  A surgeon from [facility] is always on call at the [facility], [place], [place], [place], [place]  [address] ? MAIN: [phone] ? TOLL FREE: [phone] ?  FAX [phone] [email or web address]  EATING AFTER YOUR ESOPHAGEAL SURGERY (Stomach Fundoplication, Hiatal Hernia repair, Achalasia surgery, etc)  ######################################################################  EAT Start with a pureed / full liquid diet (see below) Gradually transition to a high fiber diet with a fiber supplement over the next month after discharge.    WALK Walk an hour a day.  Control your pain to do that.    CONTROL PAIN Control pain so that you can walk, sleep, tolerate sneezing/coughing, go up/down stairs.  HAVE A BOWEL MOVEMENT DAILY Keep your bowels regular to avoid problems.  OK to try a laxative to override  constipation.  OK to use an antidairrheal to slow down diarrhea.  Call if not better after 2 tries  CALL IF YOU HAVE PROBLEMS/CONCERNS Call if you are still struggling despite following these instructions. Call if you have concerns not answered by these instructions  ######################################################################   After your esophageal surgery, expect some sticking with swallowing over the next 1-2 months.    If food sticks when you eat, it is called "dysphagia".  This is due to swelling around your esophagus at the wrap & hiatal diaphragm repair.  It will gradually ease off over the next few months.  To help you through this temporary phase, we start you out on a pureed  (blenderized) diet.  Your first meal in the hospital was thin liquids.  You should have been given a pureed diet by the time you left the hospital.  We ask patients to stay on a pureed diet for the first 2-3 weeks to avoid anything getting "stuck" near your recent surgery.  Don't be alarmed if your ability to swallow doesn't progress according to this plan.  Everyone is different and some diets can advance more or less quickly.     Some BASIC RULES to follow are:  Maintain an upright position whenever eating or drinking.  Take small bites - just a teaspoon size bite at a time.  Eat slowly.  It may also help to eat only one food at a time.  Consider nibbling through smaller, more frequent meals & avoid the urge to eat BIG meals  Do not push through feelings of fullness, nausea, or bloatedness  Do not mix solid foods and liquids in the same mouthful  Try not to "wash foods down" with large gulps of liquids.  Avoid carbonated (bubbly/fizzy) drinks.    Avoid foods that make you feel gassy or bloated.  Start with bland foods first.  Wait on trying greasy, fried, or spicy meals until you are tolerating more bland solids well.  Understand that it will be hard to burp and belch at first.  This gradually improves with time.  Expect to be more gassy/flatulent/bloated initially.  Walking will help your body manage it better.  Consider using medications for bloating that contain simethicone such as  Maalox or Gas-X   Eat in a relaxed atmosphere & minimize distractions.  Avoid talking while eating.    Do not use straws.  Following each meal, sit in an upright position (90 degree angle) for 60 to 90 minutes.  Going for a short walk can help as well  If food does stick, don't panic.  Try to relax and let the food pass on its own.  Sipping WARM LIQUID such as strong hot black tea can also help slide it down.   Be gradual in changes & use common sense:  -If you easily tolerating a certain  "level" of foods, advance to the next level gradually -If you are having trouble swallowing a particular food, then avoid it.   -If food is sticking when you advance your diet, go back to thinner previous diet (the lower LEVEL) for 1-2 days.  LEVEL 1 = PUREED DIET  Do for the first 2 WEEKS AFTER SURGERY  -Foods in this group are pureed or blenderized to a smooth, mashed potato-like consistency.  -If necessary, the pureed foods can keep their shape with the addition of a thickening agent.   -Meat should be pureed to a smooth, pasty consistency.  Hot broth or gravy may be added  to the pureed meat, approximately 1 oz. of liquid per 3 oz. serving of meat. -CAUTION:  If any foods do not puree into a smooth consistency, swallowing will be more difficult.  (For example, nuts or seeds sometimes do not blend well.)  Hot Foods Cold Foods  Pureed scrambled eggs and cheese Pureed cottage cheese  Baby cereals Thickened juices and nectars  Thinned cooked cereals (no lumps) Thickened milk or eggnog  Pureed [place] toast or pancakes Ensure  Mashed potatoes Ice cream  Pureed parsley, au gratin, scalloped potatoes, candied sweet potatoes Fruit or [place] ice, sherbet  Pureed buttered or alfredo noodles Plain yogurt  Pureed vegetables (no corn or peas) Instant breakfast  Pureed soups and creamed soups Smooth pudding, mousse, custard  Pureed scalloped apples Whipped gelatin  Gravies Sugar, syrup, honey, jelly  Sauces, cheese, tomato, barbecue, white, creamed Cream  Any baby food Creamer  Alcohol in moderation (not beer or champagne) Margarine  Coffee or tea Mayonnaise   Ketchup, mustard   Apple sauce   SAMPLE MENU:  PUREED DIET Breakfast Lunch Dinner   Orange juice, 1/2 cup  Cream of wheat, 1/2 cup  Pineapple juice, 1/2 cup  Pureed [place], barley soup, 3/4 cup  Pureed Hawaiian chicken, 3 oz   Scrambled eggs, mashed or blended with cheese, 1/2 cup  Tea or coffee, 1 cup   Whole milk, 1 cup     Non-dairy creamer, 2 Tbsp.  Mashed potatoes, 1/2 cup  Pureed cooled broccoli, 1/2 cup  Apple sauce, 1/2 cup  Coffee or tea  Mashed potatoes, 1/2 cup  Pureed spinach, 1/2 cup  Frozen yogurt, 1/2 cup  Tea or coffee      LEVEL 2 = SOFT DIET  After your first 2 weeks, you can advance to a soft diet.   Keep on this diet until everything goes down easily.  Hot Foods Cold Foods  White fish Cottage cheese  Stuffed fish Junior baby fruit  Baby food meals Semi thickened juices  Minced soft cooked, scrambled, poached eggs nectars  Souffle & omelets Ripe mashed bananas  Cooked cereals Canned fruit, pineapple sauce, milk  potatoes Milkshake  Buttered or Alfredo noodles Custard  Cooked cooled vegetable Puddings, including tapioca  Sherbet Yogurt  Vegetable soup or alphabet soup Fruit ice, [place] ice  Gravies Whipped gelatin  Sugar, syrup, honey, jelly Junior baby desserts  Sauces:  Cheese, creamed, barbecue, tomato, white Cream  Coffee or tea Margarine   SAMPLE MENU:  LEVEL 2 Breakfast Lunch Dinner   Orange juice, 1/2 cup  Oatmeal, 1/2 cup  Scrambled eggs with cheese, 1/2 cup  Decaffeinated tea, 1 cup  Whole milk, 1 cup  Non-dairy creamer, 2 Tbsp  Pineapple juice, 1/2 cup  Minced beef, 3 oz  Gravy, 2 Tbsp  Mashed potatoes, 1/2 cup  Minced fresh broccoli, 1/2 cup  Applesauce, 1/2 cup  Coffee, 1 cup  [place], barley soup, 3/4 cup  Minced Hawaiian chicken, 3 oz  Mashed potatoes, 1/2 cup  Cooked spinach, 1/2 cup  Frozen yogurt, 1/2 cup  Non-dairy creamer, 2 Tbsp      LEVEL 3 = CHOPPED DIET  -After all the foods in level 2 (soft diet) are passing through well you should advance up to more chopped foods.  -It is still important to cut these foods into small pieces and eat slowly.  Hot Foods Cold Foods  Poultry Cottage cheese  Chopped Swedish meatballs Yogurt  Meat salads (ground or flaked meat) Milk  Flaked fish (tuna)  Milkshakes  Poached  or scrambled eggs Soft, cold, dry cereal  Souffles and omelets Fruit juices or nectars  Cooked cereals Chopped canned fruit  Chopped [place] toast or pancakes Canned fruit cocktail  Noodles or pasta (no rice) Pudding, mousse, custard  Cooked vegetables (no frozen peas, corn, or mixed vegetables) Green salad  Canned small sweet peas Ice cream  Creamed soup or vegetable soup Fruit ice, [place] ice  Pureed vegetable soup or alphabet soup Non-dairy creamer  Ground scalloped apples Margarine  Gravies Mayonnaise  Sauces:  Cheese, creamed, barbecue, tomato, white Ketchup  Coffee or tea Mustard   SAMPLE MENU:  LEVEL 3 Breakfast Lunch Dinner   Orange juice, 1/2 cup  Oatmeal, 1/2 cup  Scrambled eggs with cheese, 1/2 cup  Decaffeinated tea, 1 cup  Whole milk, 1 cup  Non-dairy creamer, 2 Tbsp  Ketchup, 1 Tbsp  Margarine, 1 tsp  Salt, 1/4 tsp  Sugar, 2 tsp  Pineapple juice, 1/2 cup  Ground beef, 3 oz  Gravy, 2 Tbsp  Mashed potatoes, 1/2 cup  Cooked spinach, 1/2 cup  Applesauce, 1/2 cup  Decaffeinated coffee  Whole milk  Non-dairy creamer, 2 Tbsp  Margarine, 1 tsp  Salt, 1/4 tsp  Pureed [place], barley soup, 3/4 cup  Barbecue chicken, 3 oz  Mashed potatoes, 1/2 cup  Ground fresh broccoli, 1/2 cup  Frozen yogurt, 1/2 cup  Decaffeinated tea, 1 cup  Non-dairy creamer, 2 Tbsp  Margarine, 1 tsp  Salt, 1/4 tsp  Sugar, 1 tsp    LEVEL 4:  REGULAR FOODS  -Foods in this group are soft, moist, regularly textured foods.   -This level includes meat and breads, which tend to be the hardest things to swallow.   -Eat very slowly, chew well and continue to avoid carbonated drinks. -most people are at this level in 4-6 weeks  Hot Foods Cold Foods  Baked fish or skinned Soft cheeses - cottage cheese  Souffles and omelets Cream cheese  Eggs Yogurt  Stuffed shells Milk  Spaghetti with meat sauce Milkshakes  Cooked cereal Cold dry cereals (no nuts, dried fruit,  coconut)  [place] toast or pancakes Crackers  Buttered toast Fruit juices or nectars  Noodles or pasta (no rice) Canned fruit  Potatoes (all types) Ripe bananas  Soft, cooked vegetables (no corn, lima, or baked beans) Peeled, ripe, fresh fruit  Creamed soups or vegetable soup Cakes (no nuts, dried fruit, coconut)  Canned chicken noodle soup Plain doughnuts  Gravies Ice cream  Bacon dressing Pudding, mousse, custard  Sauces:  Cheese, creamed, barbecue, tomato, white Fruit ice, [place] ice, sherbet  Decaffeinated tea or coffee Whipped gelatin  Pork chops Regular gelatin   Canned fruited gelatin molds   Sugar, syrup, honey, jam, jelly   Cream   Non-dairy   Margarine   Oil   Mayonnaise   Ketchup   Mustard   TROUBLESHOOTING IRREGULAR BOWELS  1) Avoid extremes of bowel movements (no bad constipation/diarrhea)  2) Miralax 17gm mixed in 8oz. water or juice-daily. May use BID as needed.  3) Gas-x,Phazyme, etc. as needed for gas & bloating.  4) Soft,bland diet. No spicy,greasy,fried foods.  5) Prilosec over-the-counter as needed  6) May hold gluten/wheat products from diet to see if symptoms improve.  7) May try probiotics (Align, Activa, etc) to help calm the bowels down  7) If symptoms become worse call back immediately.    If you have any questions please call our office at [place]: [phone].

## 2016-07-05 ENCOUNTER — Ambulatory Visit (INDEPENDENT_AMBULATORY_CARE_PROVIDER_SITE_OTHER): Payer: Medicare Other | Admitting: Family Medicine

## 2016-07-05 DIAGNOSIS — E538 Deficiency of other specified B group vitamins: Secondary | ICD-10-CM | POA: Diagnosis not present

## 2016-07-05 MED ORDER — CYANOCOBALAMIN 1000 MCG/ML IJ SOLN
1000.0000 ug | Freq: Once | INTRAMUSCULAR | Status: AC
  Administered 2016-07-05: 1000 ug via INTRAMUSCULAR

## 2016-07-07 ENCOUNTER — Ambulatory Visit: Payer: Medicare Other

## 2016-08-04 ENCOUNTER — Encounter: Payer: Self-pay | Admitting: Family Medicine

## 2016-08-04 ENCOUNTER — Ambulatory Visit (INDEPENDENT_AMBULATORY_CARE_PROVIDER_SITE_OTHER): Payer: Medicare Other | Admitting: Family Medicine

## 2016-08-04 VITALS — BP 116/85 | HR 78 | Temp 97.8°F | Resp 16 | Ht 67.0 in | Wt 167.0 lb

## 2016-08-04 DIAGNOSIS — E2839 Other primary ovarian failure: Secondary | ICD-10-CM | POA: Diagnosis not present

## 2016-08-04 DIAGNOSIS — Z23 Encounter for immunization: Secondary | ICD-10-CM

## 2016-08-04 DIAGNOSIS — Z0000 Encounter for general adult medical examination without abnormal findings: Secondary | ICD-10-CM

## 2016-08-04 DIAGNOSIS — E538 Deficiency of other specified B group vitamins: Secondary | ICD-10-CM

## 2016-08-04 DIAGNOSIS — Z1211 Encounter for screening for malignant neoplasm of colon: Secondary | ICD-10-CM

## 2016-08-04 MED ORDER — CYANOCOBALAMIN 1000 MCG/ML IJ SOLN: 1000.0000 ug | Freq: Once | INTRAMUSCULAR | Status: DC

## 2016-08-04 NOTE — Progress Notes (Signed)
The patient is here for annual Medicare wellness examination and management of other chronic and acute problems. Other problems discussed today: none.   Chemistry      Component Value Date/Time   NA 136 06/19/2016 0438   K 4.6 06/19/2016 0438   CL 100 (L) 06/19/2016 0438   CO2 30 06/19/2016 0438   BUN 10 06/19/2016 0438   CREATININE 0.59 06/19/2016 0438      Component Value Date/Time   CALCIUM 8.9 06/19/2016 0438   ALKPHOS 74 06/11/2016 1500   AST 26 06/11/2016 1500   ALT 19 06/11/2016 1500   BILITOT 0.6 06/11/2016 1500        AWV DATA The risk factors are reflected in the social history.  The roster of all physicians providing medical care to patient is listed in the Snapshot section of the chart.  Activities of daily living:  The patient is 100% independent in all ADLs: dressing, toileting, feeding as well as independent mobility.  Home safety : The patient has smoke detectors in the home. They wear seatbelts. No firearms at home ( firearms are present in the home, kept in a safe fashion). There is no violence in the home.   There is no risks for hepatitis, STDs or HIV. There is no history of blood transfusion. They have no travel history to infectious disease endemic areas of the world.  The patient has not seen their dentist in the last six month: she has dentures. They have seen their eye doctor in the last year. They deny any hearing difficulty and have not had audiologic testing in the last year.  They do not  have excessive sun exposure. Discussed the need for sun protection: hats, long sleeves and use of sunscreen if there is significant sun exposure.   Diet: the importance of a healthy diet is discussed. They do have a healthy diet.  The patient has a regular exercise program:no.  Tries to walk the best she can.  The benefits of regular aerobic exercise were discussed.  Depression screen: there are no signs or vegative symptoms of depression- irritability, change in  appetite, anhedonia, sadness/tearfullness. Depression screen PHQ 2/9 08/04/2016  Decreased Interest 0  Down, Depressed, Hopeless 0  PHQ - 2 Score 0    Fall Risk  08/04/2016  Falls in the past year? No    Cognitive assessment: the patient manages all their financial and personal affairs and is actively engaged. They could relate day,date,year and events; recalled 3/3 objects at 3 minutes; performed clock-face test normally.  Reviewed advance directives with pt today.  Pt doesn't have this in place and I will give a packet to her today.  The following portions of the patient's history were reviewed and updated as appropriate: allergies, current medications, past family history, past medical history,  past surgical history, past social history  and problem list.  Vision, hearing, body mass index were assessed and reviewed. Body mass index is 26.16 kg/m.   During the course of the visit the patient was educated and counseled about appropriate screening and preventive services including :  Annual wellness visit : doing currently. diabetes screening: 06/11/16 fasting glucose was 84. colorectal cancer screening: will refer back to Dr. Laural Golden for colonoscopy (last one was normal and was >> 10 yrs ago). recommended immunizations (influenza, pneumococcal, Hep B): flu today.  Pt already UTD on prevnar and pneumovax. Bone mass measurement: will order DEXA Counseling to prevent tobacco use: n/a Depression screening: done today. Glaucoma screening: done  via her eye MD Hepatitis C virus screening:  HIV virus screening: pt doesn't qualify. Lung cancer screening: n/a Medical nutrition therapy: n/a Prostate cancer screening: n/a Screening mammography: due 01/2017 Screening pap tests, pelvic exam, and clinical breast exam: pt to call former GYN to schedule pap/pelvic Ultrasound screening for AAA: pt doesn't qualify.  A written plan of action regarding the above screening and preventative services  was given to the patient today.  Today pt had flu vaccine, she was given a packet about advance directives, a DEXA was ordered, she was referred to Dr. Laural Golden (GI) for colon cancer screening.  She will call her GYN to arrange appt for pelvic/pap.  An After Visit Summary was printed and given to the patient.  Signed:  Crissie Sickles, MD           08/04/2016

## 2016-08-05 ENCOUNTER — Encounter (INDEPENDENT_AMBULATORY_CARE_PROVIDER_SITE_OTHER): Payer: Self-pay | Admitting: *Deleted

## 2016-08-10 ENCOUNTER — Ambulatory Visit (HOSPITAL_COMMUNITY)
Admission: RE | Admit: 2016-08-10 | Discharge: 2016-08-10 | Disposition: A | Discharge status: Home / Self Care | Payer: Medicare Other | Source: Ambulatory Visit | Attending: Family Medicine | Admitting: Family Medicine

## 2016-08-10 ENCOUNTER — Encounter: Payer: Self-pay | Admitting: Family Medicine

## 2016-08-10 DIAGNOSIS — M810 Age-related osteoporosis without current pathological fracture: Secondary | ICD-10-CM | POA: Insufficient documentation

## 2016-08-10 DIAGNOSIS — E2839 Other primary ovarian failure: Secondary | ICD-10-CM | POA: Diagnosis present

## 2016-08-10 HISTORY — DX: Age-related osteoporosis without current pathological fracture: M81.0

## 2016-08-11 ENCOUNTER — Encounter: Payer: Self-pay | Admitting: Family Medicine

## 2016-08-11 ENCOUNTER — Other Ambulatory Visit: Payer: Self-pay | Admitting: Family Medicine

## 2016-08-11 MED ORDER — ALENDRONATE SODIUM 70 MG PO TABS: 70.0000 mg | ORAL_TABLET | ORAL | 11 refills | Status: DC

## 2016-08-23 ENCOUNTER — Telehealth: Payer: Self-pay | Admitting: Family Medicine

## 2016-08-23 NOTE — Telephone Encounter (Signed)
[facility]  Patient Name: Karen Delacruz  DOB: 1941-06-17    Initial Comment Caller states, her mother started taking a new rx. - side effects - she felt tightening in her chest. She took an aspirin - she still having off and on chest pain. Verified    Nurse Assessment  Nurse: Harlow Mares, RN, Suanne Marker Date/Time Eilene Ghazi Time): 08/23/2016 4:45:39 PM  Confirm and document reason for call. If symptomatic, describe symptoms. You must click the next button to save text entered. ---Caller states, her mother started taking a new rx. - side effects - she felt tightening in her chest. She took an aspirin - she still having off and on chest pain. Medication is alendronate. Pain in chest and between shoulder blades with SOB. This happened last night. Reports a slight pressure in her chest. Reports no sweaty.  Has the patient traveled out of the country within the last 30 days? ---Not Applicable  Does the patient have any new or worsening symptoms? ---Yes  Will a triage be completed? ---Yes  Related visit to physician within the last 2 weeks? ---N/A  Does the PT have any chronic conditions? (i.e. diabetes, asthma, etc.) ---Yes  List chronic conditions. ---[facility] surgery (Jun 18, 2016); Fibromyalgia  Is this a behavioral health or substance abuse call? ---No     Guidelines    Guideline Title Affirmed Question Affirmed Notes  Chest Pain [1] Chest pain lasts > 5 minutes AND [2] occurred > 3 days ago (72 hours) AND [3] NO chest pain or cardiac symptoms now    Final Disposition User   See Physician within [facility], RN, [employer] reports that patient has appt with Dr. Anitra Lauth tomorrow at 10:30am.   Referrals  REFERRED TO PCP OFFICE   Disagree/Comply: Comply

## 2016-08-24 ENCOUNTER — Telehealth: Payer: Self-pay

## 2016-08-24 ENCOUNTER — Telehealth: Payer: Self-pay | Admitting: Family Medicine

## 2016-08-24 ENCOUNTER — Encounter: Payer: Self-pay | Admitting: Family Medicine

## 2016-08-24 ENCOUNTER — Ambulatory Visit (INDEPENDENT_AMBULATORY_CARE_PROVIDER_SITE_OTHER): Payer: Medicare Other | Admitting: Family Medicine

## 2016-08-24 VITALS — BP 123/90 | HR 89 | Temp 98.0°F | Resp 18 | Wt 162.8 lb

## 2016-08-24 DIAGNOSIS — K2100 Gastro-esophageal reflux disease with esophagitis, without bleeding: Secondary | ICD-10-CM

## 2016-08-24 DIAGNOSIS — T887XXA Unspecified adverse effect of drug or medicament, initial encounter: Secondary | ICD-10-CM

## 2016-08-24 DIAGNOSIS — R0789 Other chest pain: Secondary | ICD-10-CM

## 2016-08-24 DIAGNOSIS — T50905A Adverse effect of unspecified drugs, medicaments and biological substances, initial encounter: Secondary | ICD-10-CM

## 2016-08-24 MED ORDER — PANTOPRAZOLE SODIUM 40 MG PO TBEC: DELAYED_RELEASE_TABLET | ORAL | 3 refills | Status: DC

## 2016-08-24 NOTE — Telephone Encounter (Signed)
Spoke to daughter regarding mother.  Daughter stated that mother was having severe chest pain and back pain which started the night before.  Instructed daughter to take mother to ER for chest pain and she mentioned that she spoke to [facility] and appointment was scheduled for Dr. Anitra Lauth.  I did reiterate that if mother was having chest pain she was to go to ER ASAP.

## 2016-08-24 NOTE — Telephone Encounter (Signed)
Prior Authorization submitted.

## 2016-08-24 NOTE — Telephone Encounter (Signed)
Noted. I saw pt in office about this today.

## 2016-08-24 NOTE — Telephone Encounter (Signed)
Will you pls see if this patient qualifies for Prolia. She is not a candidate for a bisphosphonate b/c she has had a Nissen Fundoplication procedure and is going to be more prone to upper GI side effects from meds, demonstrated recently by fosamax causing chest pain (esophagitis).  --Thx

## 2016-08-24 NOTE — Progress Notes (Signed)
Pre visit review using our clinic review tool, if applicable. No additional management support is needed unless otherwise documented below in the visit note. 

## 2016-08-24 NOTE — Telephone Encounter (Signed)
Noted  

## 2016-08-24 NOTE — Progress Notes (Signed)
OFFICE VISIT  08/24/2016   CC:  Chief Complaint  Patient presents with  . Chest Pain    Pain started after taking fosamax    HPI:    Patient is a 75 y.o. Caucasian female who presents for pain in chest. Onset 2 d/a about 2 in afternoon, felt central chest fullness and hurt in back between shoulder blades. She had taken fosamax that morning with full glass of water, waited at least 30 min to eat, and remained upright for at least 30 min.  Onset of CP was while she was watching TV/sitting.  This lasted about 10+ minutes.  It went away completely, but had come and gone/waxed and waned over the evening.  Next day she felt similar mild sx's off and on, plus felt some nausea.  No arm or jaw pain.  No palpitations or dizziness or SOB. This was only the 2nd time she took fosamax.  The week prior, with first dose, she recalls feeling mild similar sx's in the afternoon that were short lived and did not return.  She has no hx of CAD. She has hx of recent Nissen fundoplication/repair of diaphragmatic hernia (05/2016).   Past Medical History:  Diagnosis Date  . Abnormal EKG 05/2016   LAFB, voltage criteria for LVH in lead aVL.  Marland Kitchen Anxiety   . Cataract    right  . Degenerative arthritis of cervical spine   . Fatty liver   . Fibromyalgia   . GERD (gastroesophageal reflux disease)   . History of hiatal hernia   . Hx: UTI (urinary tract infection)    recurrent.  Pyelo hospitalization 2012  . Hypertension   . Microhematuria 02/2012   Cystoscopy with bladder biopsy and cystouretograms--pathology benign.  . Myalgia    Hx of diffuse myalgias, onset in her 67s  . Osteoporosis 08/10/2016   T-score -2.5: started fosamax 07/2016  . Periesophageal hiatal hernia 02/2016   Type 4  . Thickened endometrium 10/16/2011   on CT.  F/u pelvic u/s showed nabothian cyst to account for this..  No enometrial pathology noted on u/s, no ovaries visualized.  . Vertigo   . Vitamin B12 deficiency 11/2015   + LL PN     Past Surgical History:  Procedure Laterality Date  . BACK SURGERY  1990s x 2   Dr. Leeanne Deed in [place]  . CHOLECYSTECTOMY    . COLONOSCOPY  approx 2002   Diverticulosis, int hemorrhoids ([facility])  . ESOPHAGEAL MANOMETRY N/A 04/19/2016   Normal.  Procedure: ESOPHAGEAL MANOMETRY (EM);  Surgeon: Mauri Pole, MD;  Location: [facility];  Service: Endoscopy;  Laterality: N/A;  . EYE SURGERY Right    cataract extraction with IOL  . HYSTEROSCOPY  11/2004   Cervical hystogram   . INSERTION OF MESH N/A 06/18/2016   Procedure: INSERTION OF MESH;  Surgeon: Michael Boston, MD;  Location: [facility];  Service: General;  Laterality: N/A;  . NISSEN FUNDOPLICATION  [id number]    Outpatient Medications Prior to Visit  Medication Sig Dispense Refill  . albuterol (VENTOLIN HFA) 108 (90 BASE) MCG/ACT inhaler Inhale 1-2 puffs into the lungs every 4 (four) hours as needed for wheezing or shortness of breath. 1 Inhaler 0  . alendronate (FOSAMAX) 70 MG tablet Take 1 tablet (70 mg total) by mouth every 7 (seven) days. Take with a full glass of water on an empty stomach, and remain upright for 30 min after taking this medication. 4 tablet 11  . aspirin EC 81 MG tablet  Take 81 mg by mouth at bedtime.     . cyanocobalamin (,VITAMIN B-12,) 1000 MCG/ML injection Inject 1 mL (1,000 mcg total) into the muscle once. (Patient taking differently: Inject 1,000 mcg into the muscle every 30 (thirty) days. ) 1 mL 0  . DULoxetine (CYMBALTA) 30 MG capsule Take 1 capsule (30 mg total) by mouth daily. (Patient taking differently: Take 30 mg by mouth at bedtime. ) 90 capsule 3  . fexofenadine (ALLEGRA) 60 MG tablet Take 1 tablet (60 mg total) by mouth 2 (two) times daily. 60 tablet 6  . fluticasone (CUTIVATE) 0.05 % cream Apply to affected areas bid prn 30 g 1  . fluticasone (FLONASE) 50 MCG/ACT nasal spray Place 2 sprays into both nostrils daily. (Patient taking differently: Place 2 sprays into both nostrils daily as needed  for allergies. ) 16 g 6  . metoCLOPramide (REGLAN) 10 MG tablet Take 1 tablet (10 mg total) by mouth every 6 (six) hours as needed for nausea (nausea / vomiting). 10 tablet 5  . Multiple Vitamin (MULTIVITAMIN) tablet Take 1 tablet by mouth at bedtime.     Marland Kitchen oxyCODONE (OXY IR/ROXICODONE) 5 MG immediate release tablet Take 1-2 tablets (5-10 mg total) by mouth every 6 (six) hours as needed for moderate pain, severe pain or breakthrough pain. 30 tablet 0  . predniSONE (DELTASONE) 20 MG tablet 2 tabs po qd x 5d, then 1 tab po qd x 5d, then 1/2 tab po qd x 6d, then stop 18 tablet 0  . promethazine (PHENERGAN) 25 MG suppository Place 1 suppository (25 mg total) rectally every 6 (six) hours as needed for nausea. 2 suppository 5   Facility-Administered Medications Prior to Visit  Medication Dose Route Frequency Provider Last Rate Last Dose  . cyanocobalamin ((VITAMIN B-12)) injection 1,000 mcg  1,000 mcg Intramuscular Once Tammi Sou, MD        Allergies  Allergen Reactions  . Ciprofloxacin Nausea And Vomiting  . Codeine Nausea And Vomiting and Other (See Comments)    Feels funny  . Celebrex [Celecoxib] Rash  . Penicillins Rash    Has patient had a PCN reaction causing immediate rash, facial/tongue/throat swelling, SOB or lightheadedness with hypotension: Rash Has patient had a PCN reaction causing severe rash involving mucus membranes or skin necrosis: No Has patient had a PCN reaction that required hospitalization No Has patient had a PCN reaction occurring within the last 10 years: NO If all of the above answers are "NO", then may proceed with Cephalosporin use.    ROS As per HPI  PE: Blood pressure 123/90, pulse 89, temperature 98 F (36.7 C), temperature source Temporal, resp. rate 18, weight 162 lb 12.8 oz (73.8 kg), SpO2 96 %. Gen: Alert, well appearing.  Patient is oriented to person, place, time, and situation. [id number]: no injection, icteris, swelling, or exudate.  EOMI,  PERRLA. Mouth: lips without lesion/swelling.  Oral mucosa pink and moist. Oropharynx without erythema, exudate, or swelling.  Neck - No masses or thyromegaly or limitation in range of motion CV: RRR, no m/r/g.  Mild discomfort with palpating firmly overy sternum. LUNGS: CTA bilat, nonlabored resps, good aeration in all lung fields. EXT: no clubbing, cyanosis, or edema.   LABS:  12 lead EKG today: NSR, rate 82, nonspecific T abnormality.  Compared to EKG 05/2016 she no longer has LAFB.  IMPRESSION AND PLAN:  Med side effect/GERD with esophagitis: from fosamax. Reassured pt.  I think her upper GI anatomy (s/p Nissen) will preclude safe  use of bisphosphonate (she is too prone to variable gastric emptying rate and to GERD symptoms)--demonstrated recently by her use of fosamax. Will start pantoprazole 40mg  qd x 5d for current sx's, then change to 40mg  qd prn use of this med. Will try to get her approved for prolia.  An After Visit Summary was printed and given to the patient.  FOLLOW UP: Return if symptoms worsen or fail to improve.  Signed:  Crissie Sickles, MD           08/24/2016

## 2016-08-26 NOTE — Telephone Encounter (Signed)
Insurance verification came back.  Pt responsibility is $70.  LMOM for pt to contact office.

## 2016-08-27 ENCOUNTER — Ambulatory Visit (INDEPENDENT_AMBULATORY_CARE_PROVIDER_SITE_OTHER): Payer: Medicare Other | Admitting: Family Medicine

## 2016-08-27 DIAGNOSIS — M810 Age-related osteoporosis without current pathological fracture: Secondary | ICD-10-CM | POA: Diagnosis not present

## 2016-08-27 MED ORDER — DENOSUMAB 60 MG/ML ~~LOC~~ SOLN
60.0000 mg | Freq: Once | SUBCUTANEOUS | Status: AC
  Administered 2016-08-27: 60 mg via SUBCUTANEOUS

## 2016-08-27 NOTE — Progress Notes (Signed)
Pt presents for first prolia injection.  Patient tolerated injection well.  I had patient to wait for 10 minutes to make sure she did not have any type of reaction.  No reaction after 10 minutes of injection given.

## 2016-08-27 NOTE — Telephone Encounter (Signed)
Patient okay with prolia injection.  Came in today for her nurse visit injection of prolia.  Patient tolerated well.

## 2016-08-30 NOTE — Telephone Encounter (Signed)
Pt with osteoporosis and doesn't tolerate bisphosphonates. Agree with prolia injection given today in office.  Signed:  Crissie Sickles, MD           08/30/2016

## 2016-08-31 NOTE — Progress Notes (Signed)
Pt with osteoporosis, intolerance to oral bisphosphonates so we decided to proceed with Prolia. Agree with injection in office today. Return 6 mo for nurse visit to repeat injection.  Signed:  Crissie Sickles, MD           08/31/2016

## 2016-09-06 ENCOUNTER — Ambulatory Visit (INDEPENDENT_AMBULATORY_CARE_PROVIDER_SITE_OTHER): Payer: Medicare Other | Admitting: Family Medicine

## 2016-09-06 DIAGNOSIS — E538 Deficiency of other specified B group vitamins: Secondary | ICD-10-CM | POA: Diagnosis not present

## 2016-09-06 MED ORDER — CYANOCOBALAMIN 1000 MCG/ML IJ SOLN
1000.0000 ug | Freq: Once | INTRAMUSCULAR | Status: AC
  Administered 2016-09-06: 1000 ug via INTRAMUSCULAR

## 2016-09-07 NOTE — Progress Notes (Signed)
Pt with vit B12 deficiency. Agree with vit B12 1000 mcg IM in office today.  Signed:  Crissie Sickles, MD           09/07/2016

## 2016-10-06 ENCOUNTER — Ambulatory Visit (INDEPENDENT_AMBULATORY_CARE_PROVIDER_SITE_OTHER): Payer: Medicare Other

## 2016-10-06 DIAGNOSIS — E538 Deficiency of other specified B group vitamins: Secondary | ICD-10-CM | POA: Diagnosis not present

## 2016-10-06 MED ORDER — CYANOCOBALAMIN 1000 MCG/ML IJ SOLN
1000.0000 ug | Freq: Once | INTRAMUSCULAR | Status: AC
  Administered 2016-10-06: 1000 ug via INTRAMUSCULAR

## 2016-10-06 NOTE — Progress Notes (Signed)
Vitamin B12 given with no problems or incidence. Patient left with no complaints.

## 2016-10-12 NOTE — Progress Notes (Signed)
Pt with vit B12 deficiency. I agree with vit B12 1000 mcg IM in office.  Signed:  Crissie Sickles, MD           10/12/2016

## 2016-10-25 DIAGNOSIS — N8110 Cystocele, unspecified: Secondary | ICD-10-CM

## 2016-10-25 HISTORY — DX: Cystocele, unspecified: N81.10

## 2016-11-04 ENCOUNTER — Encounter: Payer: Self-pay | Admitting: Family Medicine

## 2016-11-04 ENCOUNTER — Ambulatory Visit (INDEPENDENT_AMBULATORY_CARE_PROVIDER_SITE_OTHER): Payer: Medicare Other | Admitting: Family Medicine

## 2016-11-04 VITALS — BP 134/88 | HR 78 | Temp 98.1°F | Resp 16 | Ht 67.0 in | Wt 158.5 lb

## 2016-11-04 DIAGNOSIS — J0100 Acute maxillary sinusitis, unspecified: Secondary | ICD-10-CM | POA: Diagnosis not present

## 2016-11-04 MED ORDER — AZITHROMYCIN 250 MG PO TABS: ORAL_TABLET | ORAL | 0 refills | Status: DC

## 2016-11-04 NOTE — Progress Notes (Signed)
OFFICE VISIT  11/04/2016   CC:  Chief Complaint  Patient presents with  . URI    x 2 weeks    HPI:    Patient is a 76 y.o. Caucasian female who presents for respiratory symptoms. Onset of symptoms about 2 weeks ago, lots of nasal congestion/runny nose, sinus pressure, PND, coughing a lot.  No fever.  Throat mildly sore.  Some HA off and on.  Mucinex DM no help. No wheezing, chest tightness, or SOB. Poor energy level.  Describes "double-sickening".   Past Medical History:  Diagnosis Date  . Abnormal EKG 05/2016   LAFB, voltage criteria for LVH in lead aVL.  Marland Kitchen Anxiety   . Cataract    right  . Degenerative arthritis of cervical spine   . Fatty liver   . Fibromyalgia   . GERD (gastroesophageal reflux disease)   . History of hiatal hernia   . Hx: UTI (urinary tract infection)    recurrent.  Pyelo hospitalization 2012  . Hypertension   . Microhematuria 02/2012   Cystoscopy with bladder biopsy and cystouretograms--pathology benign.  . Myalgia    Hx of diffuse myalgias, onset in her 7s  . Osteoporosis 08/10/2016   T-score -2.5: started fosamax 07/2016  . Periesophageal hiatal hernia 02/2016   Type 4  . Thickened endometrium 10/16/2011   on CT.  F/u pelvic u/s showed nabothian cyst to account for this..  No enometrial pathology noted on u/s, no ovaries visualized.  . Vertigo   . Vitamin B12 deficiency 11/2015   + LL PN    Past Surgical History:  Procedure Laterality Date  . BACK SURGERY  1990s x 2   Dr. Leeanne Deed in [place]  . CHOLECYSTECTOMY    . COLONOSCOPY  approx 2002   Diverticulosis, int hemorrhoids ([facility])  . ESOPHAGEAL MANOMETRY N/A 04/19/2016   Normal.  Procedure: ESOPHAGEAL MANOMETRY (EM);  Surgeon: Mauri Pole, MD;  Location: [facility];  Service: Endoscopy;  Laterality: N/A;  . EYE SURGERY Right    cataract extraction with IOL  . HYSTEROSCOPY  11/2004   Cervical hystogram   . INSERTION OF MESH N/A 06/18/2016   Procedure: INSERTION OF MESH;   Surgeon: Michael Boston, MD;  Location: [facility];  Service: General;  Laterality: N/A;  . NISSEN FUNDOPLICATION  [id number]    Outpatient Medications Prior to Visit  Medication Sig Dispense Refill  . albuterol (VENTOLIN HFA) 108 (90 BASE) MCG/ACT inhaler Inhale 1-2 puffs into the lungs every 4 (four) hours as needed for wheezing or shortness of breath. 1 Inhaler 0  . aspirin EC 81 MG tablet Take 81 mg by mouth at bedtime.     . cyanocobalamin (,VITAMIN B-12,) 1000 MCG/ML injection Inject 1 mL (1,000 mcg total) into the muscle once. (Patient taking differently: Inject 1,000 mcg into the muscle every 30 (thirty) days. ) 1 mL 0  . DULoxetine (CYMBALTA) 30 MG capsule Take 1 capsule (30 mg total) by mouth daily. (Patient taking differently: Take 30 mg by mouth at bedtime. ) 90 capsule 3  . fexofenadine (ALLEGRA) 60 MG tablet Take 1 tablet (60 mg total) by mouth 2 (two) times daily. 60 tablet 6  . fluticasone (CUTIVATE) 0.05 % cream Apply to affected areas bid prn 30 g 1  . fluticasone (FLONASE) 50 MCG/ACT nasal spray Place 2 sprays into both nostrils daily. (Patient taking differently: Place 2 sprays into both nostrils daily as needed for allergies. ) 16 g 6  . metoCLOPramide (REGLAN) 10 MG tablet  Take 1 tablet (10 mg total) by mouth every 6 (six) hours as needed for nausea (nausea / vomiting). 10 tablet 5  . Multiple Vitamin (MULTIVITAMIN) tablet Take 1 tablet by mouth at bedtime.     . pantoprazole (PROTONIX) 40 MG tablet 1 tab po qd prn acid reflux 30 tablet 3  . promethazine (PHENERGAN) 25 MG suppository Place 1 suppository (25 mg total) rectally every 6 (six) hours as needed for nausea. 2 suppository 5   Facility-Administered Medications Prior to Visit  Medication Dose Route Frequency Provider Last Rate Last Dose  . cyanocobalamin ((VITAMIN B-12)) injection 1,000 mcg  1,000 mcg Intramuscular Once Tammi Sou, MD        Allergies  Allergen Reactions  . Ciprofloxacin Nausea And Vomiting  .  Codeine Nausea And Vomiting and Other (See Comments)    Feels funny  . Celebrex [Celecoxib] Rash  . Penicillins Rash    Has patient had a PCN reaction causing immediate rash, facial/tongue/throat swelling, SOB or lightheadedness with hypotension: Rash Has patient had a PCN reaction causing severe rash involving mucus membranes or skin necrosis: No Has patient had a PCN reaction that required hospitalization No Has patient had a PCN reaction occurring within the last 10 years: NO If all of the above answers are "NO", then may proceed with Cephalosporin use.    ROS As per HPI  PE: Blood pressure 134/88, pulse 78, temperature 98.1 F (36.7 C), temperature source Oral, resp. rate 16, height 5\' 7"  (1.702 m), weight 158 lb 8 oz (71.9 kg), SpO2 96 %. VS: noted--normal. Gen: alert, NAD, NONTOXIC APPEARING. HEENT: eyes without injection, drainage, or swelling.  Ears: EACs clear, TMs with normal light reflex and landmarks.  Nose: Clear rhinorrhea, with some dried, crusty exudate adherent to mildly injected mucosa.  No purulent d/c.  R>L paranasal sinus TTP.  No facial swelling.  Throat and mouth without focal lesion.  No pharyngial swelling, erythema, or exudate.   Neck: supple, no LAD.   LUNGS: CTA bilat, nonlabored resps.   CV: RRR, no m/r/g. EXT: no c/c/e SKIN: no rash  LABS:  none  IMPRESSION AND PLAN:  Acute sinusitis: Zpack rx'd. Continue mucinex DM and flonase. Signs/symptoms to call or return for were reviewed and pt expressed understanding.  An After Visit Summary was printed and given to the patient.  FOLLOW UP: Return if symptoms worsen or fail to improve.  Signed:  Crissie Sickles, MD           11/04/2016

## 2016-11-04 NOTE — Progress Notes (Signed)
Pre visit review using our clinic review tool, if applicable. No additional management support is needed unless otherwise documented below in the visit note. 

## 2016-11-05 ENCOUNTER — Ambulatory Visit: Payer: Medicare Other

## 2016-11-08 ENCOUNTER — Ambulatory Visit (INDEPENDENT_AMBULATORY_CARE_PROVIDER_SITE_OTHER): Payer: Medicare Other | Admitting: Family Medicine

## 2016-11-08 DIAGNOSIS — E538 Deficiency of other specified B group vitamins: Secondary | ICD-10-CM

## 2016-11-08 MED ORDER — CYANOCOBALAMIN 1000 MCG/ML IJ SOLN
1000.0000 ug | Freq: Once | INTRAMUSCULAR | Status: AC
  Administered 2016-11-08: 1000 ug via INTRAMUSCULAR

## 2016-11-08 NOTE — Progress Notes (Signed)
Pt with dx of vit B12 deficiency. Agree with vit B12 1000 mcg IM in office today.  Signed:  Crissie Sickles, MD           11/08/2016

## 2016-11-19 ENCOUNTER — Encounter: Payer: Self-pay | Admitting: Family Medicine

## 2016-11-19 ENCOUNTER — Ambulatory Visit (INDEPENDENT_AMBULATORY_CARE_PROVIDER_SITE_OTHER): Payer: Medicare Other | Admitting: Family Medicine

## 2016-11-19 VITALS — BP 139/91 | HR 72 | Temp 98.0°F | Resp 16 | Ht 67.0 in | Wt 158.5 lb

## 2016-11-19 DIAGNOSIS — F411 Generalized anxiety disorder: Secondary | ICD-10-CM | POA: Diagnosis not present

## 2016-11-19 DIAGNOSIS — L509 Urticaria, unspecified: Secondary | ICD-10-CM

## 2016-11-19 DIAGNOSIS — F418 Other specified anxiety disorders: Secondary | ICD-10-CM

## 2016-11-19 DIAGNOSIS — F331 Major depressive disorder, recurrent, moderate: Secondary | ICD-10-CM

## 2016-11-19 LAB — COMPREHENSIVE METABOLIC PANEL
ALBUMIN: 4.1 g/dL (ref 3.5–5.2)
ALT: 12 U/L (ref 0–35)
AST: 17 U/L (ref 0–37)
Alkaline Phosphatase: 54 U/L (ref 39–117)
BILIRUBIN TOTAL: 0.7 mg/dL (ref 0.2–1.2)
BUN: 10 mg/dL (ref 6–23)
CHLORIDE: 104 meq/L (ref 96–112)
CO2: 31 mEq/L (ref 19–32)
CREATININE: 0.62 mg/dL (ref 0.40–1.20)
Calcium: 9.4 mg/dL (ref 8.4–10.5)
GFR: 99.78 mL/min (ref >60.00)
Glucose, Bld: 86 mg/dL (ref 70–99)
Potassium: 4.1 mEq/L (ref 3.5–5.1)
SODIUM: 141 meq/L (ref 135–145)
Total Protein: 7.1 g/dL (ref 6.0–8.3)

## 2016-11-19 LAB — CBC WITH DIFFERENTIAL/PLATELET
BASOS ABS: 0.1 10*3/uL (ref 0.0–0.1)
BASOS PCT: 0.6 % (ref 0.0–3.0)
EOS ABS: 0.3 10*3/uL (ref 0.0–0.7)
Eosinophils Relative: 4.4 % (ref 0.0–5.0)
HCT: 40.9 % (ref 36.0–46.0)
HEMOGLOBIN: 13.8 g/dL (ref 12.0–15.0)
LYMPHS PCT: 39.8 % (ref 12.0–46.0)
Lymphs Abs: 3.2 10*3/uL (ref 0.7–4.0)
MCHC: 33.8 g/dL (ref 30.0–36.0)
MCV: 93.5 fl (ref 78.0–100.0)
MONO ABS: 0.8 10*3/uL (ref 0.1–1.0)
Monocytes Relative: 10.5 % (ref 3.0–12.0)
Neutro Abs: 3.6 10*3/uL (ref 1.4–7.7)
Neutrophils Relative %: 44.7 % (ref 43.0–77.0)
Platelets: 198 10*3/uL (ref 150.0–400.0)
RBC: 4.38 Mil/uL (ref 3.87–5.11)
RDW: 13.3 % (ref 11.5–15.5)
WBC: 8 10*3/uL (ref 4.0–10.5)

## 2016-11-19 LAB — TSH: TSH: 1.6 u[IU]/mL (ref 0.35–4.50)

## 2016-11-19 MED ORDER — DULOXETINE HCL 60 MG PO CPEP: 60.0000 mg | ORAL_CAPSULE | Freq: Every day | ORAL | 3 refills | Status: DC

## 2016-11-19 MED ORDER — LORAZEPAM 0.5 MG PO TABS: ORAL_TABLET | ORAL | 1 refills | Status: DC

## 2016-11-19 NOTE — Progress Notes (Signed)
OFFICE VISIT  11/19/2016   CC:  Chief Complaint  Patient presents with  . Rash   HPI:    Patient is a 76 y.o. Caucasian female who presents for hives. About a week ago, felt itchy when she went to bed, then broke out in hives on her legs.  The hives lasted ?hours or less.  She took some benadryl.  She has had some faint/mild recurrence of the rash on/off.  Lips occ get numb feeling.  No fevers.  No swelling of lips, tongue, throat, or eyes.  Her nerves are really bothering her.  Feels very anxious and depressed, having crying spells.  Has been feeling like this for the last 2 weeks at least.  Felt like anx/dep coming on for weeks prior, just worse the last 2 wks. Feels overwhelmed with worry about her daughter, husband, and grandchildren.  Also, son told her he is leaving his wife.   Appetite is down.  No n/v or abd pains.  Past Medical History:  Diagnosis Date  . Abnormal EKG 05/2016   LAFB, voltage criteria for LVH in lead aVL.  Marland Kitchen Anxiety   . Cataract    right  . Degenerative arthritis of cervical spine   . Fatty liver   . Fibromyalgia   . GERD (gastroesophageal reflux disease)   . History of hiatal hernia   . Hx: UTI (urinary tract infection)    recurrent.  Pyelo hospitalization 2012  . Hypertension   . Microhematuria 02/2012   Cystoscopy with bladder biopsy and cystouretograms--pathology benign.  . Myalgia    Hx of diffuse myalgias, onset in her 46s  . Osteoporosis 08/10/2016   T-score -2.5: started fosamax 07/2016.  Intol fosamax; started prolia 08/27/2016.  Marland Kitchen Periesophageal hiatal hernia 02/2016   Type 4  . Thickened endometrium 10/16/2011   on CT.  F/u pelvic u/s showed nabothian cyst to account for this..  No enometrial pathology noted on u/s, no ovaries visualized.  . Vertigo   . Vitamin B12 deficiency 11/2015   + LL PN    Past Surgical History:  Procedure Laterality Date  . BACK SURGERY  1990s x 2   Dr. Leeanne Deed in [place]  . CHOLECYSTECTOMY    . COLONOSCOPY   approx 2002   Diverticulosis, int hemorrhoids ([facility])  . ESOPHAGEAL MANOMETRY N/A 04/19/2016   Normal.  Procedure: ESOPHAGEAL MANOMETRY (EM);  Surgeon: Mauri Pole, MD;  Location: [facility];  Service: Endoscopy;  Laterality: N/A;  . EYE SURGERY Right    cataract extraction with IOL  . HYSTEROSCOPY  11/2004   Cervical hystogram   . INSERTION OF MESH N/A 06/18/2016   Procedure: INSERTION OF MESH;  Surgeon: Michael Boston, MD;  Location: [facility];  Service: General;  Laterality: N/A;  . NISSEN FUNDOPLICATION  [id number]    Outpatient Medications Prior to Visit  Medication Sig Dispense Refill  . albuterol (VENTOLIN HFA) 108 (90 BASE) MCG/ACT inhaler Inhale 1-2 puffs into the lungs every 4 (four) hours as needed for wheezing or shortness of breath. 1 Inhaler 0  . aspirin EC 81 MG tablet Take 81 mg by mouth at bedtime.     . cyanocobalamin (,VITAMIN B-12,) 1000 MCG/ML injection Inject 1 mL (1,000 mcg total) into the muscle once. (Patient taking differently: Inject 1,000 mcg into the muscle every 30 (thirty) days. ) 1 mL 0  . fexofenadine (ALLEGRA) 60 MG tablet Take 1 tablet (60 mg total) by mouth 2 (two) times daily. 60 tablet 6  .  fluticasone (CUTIVATE) 0.05 % cream Apply to affected areas bid prn 30 g 1  . fluticasone (FLONASE) 50 MCG/ACT nasal spray Place 2 sprays into both nostrils daily. (Patient taking differently: Place 2 sprays into both nostrils daily as needed for allergies. ) 16 g 6  . metoCLOPramide (REGLAN) 10 MG tablet Take 1 tablet (10 mg total) by mouth every 6 (six) hours as needed for nausea (nausea / vomiting). 10 tablet 5  . Multiple Vitamin (MULTIVITAMIN) tablet Take 1 tablet by mouth at bedtime.     . pantoprazole (PROTONIX) 40 MG tablet 1 tab po qd prn acid reflux 30 tablet 3  . promethazine (PHENERGAN) 25 MG suppository Place 1 suppository (25 mg total) rectally every 6 (six) hours as needed for nausea. 2 suppository 5  . DULoxetine (CYMBALTA) 30 MG capsule Take  1 capsule (30 mg total) by mouth daily. (Patient taking differently: Take 30 mg by mouth at bedtime. ) 90 capsule 3  . azithromycin (ZITHROMAX) 250 MG tablet 2 tabs po qd x 1d, then 1 tab po qd x 4d (Patient not taking: Reported on 11/19/2016) 6 tablet 0   Facility-Administered Medications Prior to Visit  Medication Dose Route Frequency Provider Last Rate Last Dose  . cyanocobalamin ((VITAMIN B-12)) injection 1,000 mcg  1,000 mcg Intramuscular Once Tammi Sou, MD        Allergies  Allergen Reactions  . Ciprofloxacin Nausea And Vomiting  . Codeine Nausea And Vomiting and Other (See Comments)    Feels funny  . Celebrex [Celecoxib] Rash  . Penicillins Rash    Has patient had a PCN reaction causing immediate rash, facial/tongue/throat swelling, SOB or lightheadedness with hypotension: Rash Has patient had a PCN reaction causing severe rash involving mucus membranes or skin necrosis: No Has patient had a PCN reaction that required hospitalization No Has patient had a PCN reaction occurring within the last 10 years: NO If all of the above answers are "NO", then may proceed with Cephalosporin use.    ROS As per HPI  PE: Blood pressure (!) 139/91, pulse 72, temperature 98 F (36.7 C), temperature source Oral, resp. rate 16, height 5\' 7"  (1.702 m), weight 158 lb 8 oz (71.9 kg), SpO2 95 %. Gen: Alert, well appearing.  Patient is oriented to person, place, time, and situation. AFFECT: blunted, weeping some, lucid thought and speech. [id number]: no injection, icteris, swelling, or exudate.  EOMI, PERRLA. Mouth: lips without lesion/swelling.  Oral mucosa pink and moist. Oropharynx without erythema, exudate, or swelling.  CV: RRR, no m/r/g.   LUNGS: CTA bilat, nonlabored resps, good aeration in all lung fields. EXT: no clubbing, cyanosis, or edema.  SKIN: no rash.  LABS:  Lab Results  Component Value Date   WBC 12.5 (H) 06/19/2016   HGB 13.7 06/19/2016   HCT 40.1 06/19/2016   MCV 94.1  06/19/2016   PLT 67 (L) 06/19/2016     IMPRESSION AND PLAN:  1) Urticaria; suspect this is related to her emotional state lately. Continue allegra 60mg  bid.  Will check CBC w/diff, CMET, and TSH today.  2) GAD with MDD: she is overwhelmed by her family's relationship problems the last year or so.  She can't stand the way it is affecting her grandchildren.  She is hesitant to go to counseling and declines this at this time. She is agreeable to increase of her duloxetine to 60mg  qd. Also start ativan 0.5mg , 1-2 tabs bid prn anxiety.  An After Visit Summary was printed and given  to the patient.  FOLLOW UP: Return for f/u 3-4 wks anxiety/depression/hives.  Signed:  Crissie Sickles, MD           11/19/2016

## 2016-11-19 NOTE — Progress Notes (Signed)
Pre visit review using our clinic review tool, if applicable. No additional management support is needed unless otherwise documented below in the visit note. 

## 2016-12-17 ENCOUNTER — Telehealth: Payer: Self-pay | Admitting: Family Medicine

## 2016-12-17 NOTE — Telephone Encounter (Signed)
Patients daughter Kenney Houseman is wanting to schedule patient for a follow up/b12 with dr Anitra Lauth along with a b12 at the same time for patients husband. She needs to following. I saw no availability to suit her needs for several weeks. Is there a way to make an accomodation? Please give tonya a call  Morning time M,T,W,F

## 2016-12-17 NOTE — Telephone Encounter (Signed)
Do they both want to see Dr. Anitra Lauth or will one of them be a nurse visit?

## 2016-12-20 NOTE — Telephone Encounter (Signed)
Husband is a nurse visit. Sorry to confuse

## 2016-12-22 ENCOUNTER — Ambulatory Visit (INDEPENDENT_AMBULATORY_CARE_PROVIDER_SITE_OTHER): Payer: Medicare Other | Admitting: Family Medicine

## 2016-12-22 DIAGNOSIS — E538 Deficiency of other specified B group vitamins: Secondary | ICD-10-CM | POA: Diagnosis not present

## 2016-12-22 MED ORDER — CYANOCOBALAMIN 1000 MCG/ML IJ SOLN
1000.0000 ug | Freq: Once | INTRAMUSCULAR | Status: AC
  Administered 2016-12-22: 1000 ug via INTRAMUSCULAR

## 2016-12-23 ENCOUNTER — Ambulatory Visit: Payer: Medicare Other

## 2016-12-23 NOTE — Progress Notes (Signed)
Pt with vit b12 deficiency. Agree with vit B12 1000 mcg IM in office 12/22/16.  Signed:  Crissie Sickles, MD           12/23/2016

## 2017-01-24 ENCOUNTER — Ambulatory Visit (INDEPENDENT_AMBULATORY_CARE_PROVIDER_SITE_OTHER): Payer: Medicare Other | Admitting: Family Medicine

## 2017-01-24 DIAGNOSIS — E538 Deficiency of other specified B group vitamins: Secondary | ICD-10-CM

## 2017-01-24 MED ORDER — CYANOCOBALAMIN 1000 MCG/ML IJ SOLN
1000.0000 ug | Freq: Once | INTRAMUSCULAR | Status: AC
  Administered 2017-01-24: 1000 ug via INTRAMUSCULAR

## 2017-01-24 NOTE — Progress Notes (Signed)
Pt with vit B12 deficiency. Agree with vit B12 1000 mcg IM in office today.  Signed:  Crissie Sickles, MD           01/24/2017

## 2017-01-26 ENCOUNTER — Other Ambulatory Visit: Payer: Self-pay | Admitting: Family Medicine

## 2017-01-26 DIAGNOSIS — Z1231 Encounter for screening mammogram for malignant neoplasm of breast: Secondary | ICD-10-CM

## 2017-02-01 ENCOUNTER — Encounter: Payer: Self-pay | Admitting: Family Medicine

## 2017-02-01 ENCOUNTER — Ambulatory Visit (INDEPENDENT_AMBULATORY_CARE_PROVIDER_SITE_OTHER): Payer: Medicare Other | Admitting: Family Medicine

## 2017-02-01 VITALS — BP 123/74 | HR 72 | Temp 98.0°F | Resp 16 | Ht 67.0 in | Wt 153.2 lb

## 2017-02-01 DIAGNOSIS — E538 Deficiency of other specified B group vitamins: Secondary | ICD-10-CM

## 2017-02-01 DIAGNOSIS — R5383 Other fatigue: Secondary | ICD-10-CM

## 2017-02-01 DIAGNOSIS — M2550 Pain in unspecified joint: Secondary | ICD-10-CM

## 2017-02-01 DIAGNOSIS — M353 Polymyalgia rheumatica: Secondary | ICD-10-CM

## 2017-02-01 LAB — CBC WITH DIFFERENTIAL/PLATELET
BASOS ABS: 0.1 10*3/uL (ref 0.0–0.1)
Basophils Relative: 1 % (ref 0.0–3.0)
EOS ABS: 0.4 10*3/uL (ref 0.0–0.7)
Eosinophils Relative: 5.9 % — ABNORMAL HIGH (ref 0.0–5.0)
HCT: 43.2 % (ref 36.0–46.0)
Hemoglobin: 14.9 g/dL (ref 12.0–15.0)
Lymphocytes Relative: 31.5 % (ref 12.0–46.0)
Lymphs Abs: 2 10*3/uL (ref 0.7–4.0)
MCHC: 34.4 g/dL (ref 30.0–36.0)
MCV: 94.5 fl (ref 78.0–100.0)
MONOS PCT: 8.7 % (ref 3.0–12.0)
Monocytes Absolute: 0.6 10*3/uL (ref 0.1–1.0)
NEUTROS ABS: 3.4 10*3/uL (ref 1.4–7.7)
Neutrophils Relative %: 52.9 % (ref 43.0–77.0)
PLATELETS: 258 10*3/uL (ref 150.0–400.0)
RBC: 4.58 Mil/uL (ref 3.87–5.11)
RDW: 13.6 % (ref 11.5–15.5)
WBC: 6.5 10*3/uL (ref 4.0–10.5)

## 2017-02-01 LAB — TSH: TSH: 3 u[IU]/mL (ref 0.35–4.50)

## 2017-02-01 LAB — C-REACTIVE PROTEIN: CRP: 0.3 mg/dL — AB (ref 0.5–20.0)

## 2017-02-01 LAB — VITAMIN B12: Vitamin B-12: 460 pg/mL (ref 211–911)

## 2017-02-01 LAB — CK: CK TOTAL: 47 U/L (ref 7–177)

## 2017-02-01 LAB — SEDIMENTATION RATE: SED RATE: 5 mm/h (ref 0–30)

## 2017-02-01 MED ORDER — GABAPENTIN 100 MG PO CAPS: 100.0000 mg | ORAL_CAPSULE | Freq: Three times a day (TID) | ORAL | 1 refills | Status: DC

## 2017-02-01 NOTE — Progress Notes (Signed)
OFFICE VISIT  02/01/2017   CC:  Chief Complaint  Patient presents with  . Fatigue   HPI:    Patient is a 76 y.o. Caucasian female with history of fibromyalgia and recently dx'd with depression who presents accompanied by her husband for "hurting all over" and fatigue. Onset a couple weeks ago.  General achiness in joints and muscles, diffuse (she feels like this is fibromyalgia), tired in her body but also wanting to sleep all the time.  Says mood has been better since last visit.  Stress and anxiety are improved as well.  Snores once in a while, no witnessed apneic spells.  No abd pain.  No cough, no URI sx, no ST. No fevers. Denies BRBPR or melena.  No appetite.  No blisters/sores in mouth.  Still having recurrent itchy hives, usually around the waist region, no identified trigger.  These resolve with taking an antihistamine.  She showed me photos of this rash today and it does look like hives.  No dysuria, hematuria, urgency, frequency.  Bowels moving fine.   No signif HAs.     Also, has dysphagia of solid food.  Occ gets choked briefly and has to regurgitate it.  She was told after her hernia surgery that this may happen for a while.  No abd pain.  Occ GER.  Past Medical History:  Diagnosis Date  . Abnormal EKG 05/2016   LAFB, voltage criteria for LVH in lead aVL.  Marland Kitchen Anxiety   . Cataract    right  . Degenerative arthritis of cervical spine   . Fatty liver   . Fibromyalgia   . GERD (gastroesophageal reflux disease)   . History of hiatal hernia   . Hx: UTI (urinary tract infection)    recurrent.  Pyelo hospitalization 2012  . Hypertension   . Microhematuria 02/2012   Cystoscopy with bladder biopsy and cystouretograms--pathology benign.  . Myalgia    Hx of diffuse myalgias, onset in her 60s  . Osteoporosis 08/10/2016   T-score -2.5: started fosamax 07/2016.  Intol fosamax; started prolia 08/27/2016.  Marland Kitchen Periesophageal hiatal hernia 02/2016   Type 4  . Thickened endometrium  10/16/2011   on CT.  F/u pelvic u/s showed nabothian cyst to account for this..  No enometrial pathology noted on u/s, no ovaries visualized.  . Vertigo   . Vitamin B12 deficiency 11/2015   + LL PN    Past Surgical History:  Procedure Laterality Date  . BACK SURGERY  1990s x 2   Dr. Leeanne Deed in [place]  . CHOLECYSTECTOMY    . COLONOSCOPY  approx 2002   Diverticulosis, int hemorrhoids ([facility])  . ESOPHAGEAL MANOMETRY N/A 04/19/2016   Normal.  Procedure: ESOPHAGEAL MANOMETRY (EM);  Surgeon: Mauri Pole, MD;  Location: [facility];  Service: Endoscopy;  Laterality: N/A;  . EYE SURGERY Right    cataract extraction with IOL  . HYSTEROSCOPY  11/2004   Cervical hystogram   . INSERTION OF MESH N/A 06/18/2016   Procedure: INSERTION OF MESH;  Surgeon: Michael Boston, MD;  Location: [facility];  Service: General;  Laterality: N/A;  . NISSEN FUNDOPLICATION  16/60/6301    Outpatient Medications Prior to Visit  Medication Sig Dispense Refill  . albuterol (VENTOLIN HFA) 108 (90 BASE) MCG/ACT inhaler Inhale 1-2 puffs into the lungs every 4 (four) hours as needed for wheezing or shortness of breath. 1 Inhaler 0  . aspirin EC 81 MG tablet Take 81 mg by mouth at bedtime.     Marland Kitchen  cyanocobalamin (,VITAMIN B-12,) 1000 MCG/ML injection Inject 1 mL (1,000 mcg total) into the muscle once. (Patient taking differently: Inject 1,000 mcg into the muscle every 30 (thirty) days. ) 1 mL 0  . DULoxetine (CYMBALTA) 60 MG capsule Take 1 capsule (60 mg total) by mouth daily. 30 capsule 3  . fexofenadine (ALLEGRA) 60 MG tablet Take 1 tablet (60 mg total) by mouth 2 (two) times daily. 60 tablet 6  . fluticasone (CUTIVATE) 0.05 % cream Apply to affected areas bid prn 30 g 1  . fluticasone (FLONASE) 50 MCG/ACT nasal spray Place 2 sprays into both nostrils daily. (Patient taking differently: Place 2 sprays into both nostrils daily as needed for allergies. ) 16 g 6  . LORazepam (ATIVAN) 0.5 MG tablet 1-2 tabs po bid prn  anxiety 60 tablet 1  . metoCLOPramide (REGLAN) 10 MG tablet Take 1 tablet (10 mg total) by mouth every 6 (six) hours as needed for nausea (nausea / vomiting). 10 tablet 5  . Multiple Vitamin (MULTIVITAMIN) tablet Take 1 tablet by mouth at bedtime.     . pantoprazole (PROTONIX) 40 MG tablet 1 tab po qd prn acid reflux 30 tablet 3  . promethazine (PHENERGAN) 25 MG suppository Place 1 suppository (25 mg total) rectally every 6 (six) hours as needed for nausea. 2 suppository 5   Facility-Administered Medications Prior to Visit  Medication Dose Route Frequency Provider Last Rate Last Dose  . cyanocobalamin ((VITAMIN B-12)) injection 1,000 mcg  1,000 mcg Intramuscular Once Tammi Sou, MD        Allergies  Allergen Reactions  . Ciprofloxacin Nausea And Vomiting  . Codeine Nausea And Vomiting and Other (See Comments)    Feels funny  . Celebrex [Celecoxib] Rash  . Penicillins Rash    Has patient had a PCN reaction causing immediate rash, facial/tongue/throat swelling, SOB or lightheadedness with hypotension: Rash Has patient had a PCN reaction causing severe rash involving mucus membranes or skin necrosis: No Has patient had a PCN reaction that required hospitalization No Has patient had a PCN reaction occurring within the last 10 years: NO If all of the above answers are "NO", then may proceed with Cephalosporin use.    ROS As per HPI  PE: Blood pressure 123/74, pulse 72, temperature 98 F (36.7 C), temperature source Oral, resp. rate 16, height 5\' 7"  (1.702 m), weight 153 lb 4 oz (69.5 kg), SpO2 97 %. Gen: Alert, well appearing.  Patient is oriented to person, place, time, and situation. AFFECT: pleasant, lucid thought and speech. CVE:LFYB: no injection, icteris, swelling, or exudate.  EOMI, PERRLA. Mouth: lips without lesion/swelling.  Oral mucosa pink and moist. Oropharynx without erythema, exudate, or swelling.  Neck - No masses or thyromegaly or limitation in range of  motion CV: RRR, no m/r/g.   LUNGS: CTA bilat, nonlabored resps, good aeration in all lung fields. ABD: soft, NT, ND, BS normal.  No hepatospenomegaly or mass.  No bruits. EXT: no clubbing, cyanosis, or edema.  Musculoskeletal: no joint swelling, erythema, warmth, or tenderness.  ROM of all joints intact. No muscle tenderness to palpation.  LABS:  Lab Results  Component Value Date   VITAMINB12 460 02/01/2017     Chemistry      Component Value Date/Time   NA 141 11/19/2016 1335   K 4.1 11/19/2016 1335   CL 104 11/19/2016 1335   CO2 31 11/19/2016 1335   BUN 10 11/19/2016 1335   CREATININE 0.62 11/19/2016 1335  Component Value Date/Time   CALCIUM 9.4 11/19/2016 1335   ALKPHOS 54 11/19/2016 1335   AST 17 11/19/2016 1335   ALT 12 11/19/2016 1335   BILITOT 0.7 11/19/2016 1335     Lab Results  Component Value Date   WBC 6.5 02/01/2017   HGB 14.9 02/01/2017   HCT 43.2 02/01/2017   MCV 94.5 02/01/2017   PLT 258.0 02/01/2017   Lab Results  Component Value Date   TSH 3.00 02/01/2017   IMPRESSION AND PLAN:  Polymyalgia, polyarthralgia, with accompanying fatigue: suspect "flare" of her fibromyalgia. Plan is to check a few general rheum labs:   TSH, ANA, CK total, ANA, CRP, and vit b12 level. Also, will start low dose gabapentin 100 mg tid.  Therapeutic expectations and side effect profile of medication discussed today.  Patient's questions answered. Continue cymbalta 60mg  qd, as it can help for fibromyalgia and seems to be helping her depression and anxiety.  An After Visit Summary was printed and given to the patient.  FOLLOW UP: Return in about 4 weeks (around 03/01/2017) for f/u fibromyalgia.  Signed:  Crissie Sickles, MD           02/01/2017

## 2017-02-01 NOTE — Progress Notes (Signed)
Pre visit review using our clinic review tool, if applicable. No additional management support is needed unless otherwise documented below in the visit note. 

## 2017-02-02 ENCOUNTER — Ambulatory Visit: Payer: Medicare Other | Admitting: Family Medicine

## 2017-02-03 LAB — FANA STAINING PATTERNS: Homogeneous Pattern: 1:80 {titer}

## 2017-02-03 LAB — ANTINUCLEAR ANTIBODIES, IFA: ANA TITER 1: POSITIVE — AB

## 2017-02-14 ENCOUNTER — Ambulatory Visit (HOSPITAL_COMMUNITY)
Admission: RE | Admit: 2017-02-14 | Discharge: 2017-02-14 | Disposition: A | Discharge status: Home / Self Care | Payer: Medicare Other | Source: Ambulatory Visit | Attending: Family Medicine | Admitting: Family Medicine

## 2017-02-14 DIAGNOSIS — Z1231 Encounter for screening mammogram for malignant neoplasm of breast: Secondary | ICD-10-CM | POA: Diagnosis present

## 2017-02-24 ENCOUNTER — Ambulatory Visit (INDEPENDENT_AMBULATORY_CARE_PROVIDER_SITE_OTHER): Payer: Medicare Other | Admitting: Family Medicine

## 2017-02-24 DIAGNOSIS — E538 Deficiency of other specified B group vitamins: Secondary | ICD-10-CM | POA: Diagnosis not present

## 2017-02-24 DIAGNOSIS — M810 Age-related osteoporosis without current pathological fracture: Secondary | ICD-10-CM

## 2017-02-24 MED ORDER — DENOSUMAB 60 MG/ML ~~LOC~~ SOLN
60.0000 mg | Freq: Once | SUBCUTANEOUS | Status: AC
  Administered 2017-02-24: 60 mg via SUBCUTANEOUS

## 2017-02-24 MED ORDER — CYANOCOBALAMIN 1000 MCG/ML IJ SOLN
1000.0000 ug | Freq: Once | INTRAMUSCULAR | Status: AC
  Administered 2017-02-24: 1000 ug via INTRAMUSCULAR

## 2017-02-24 NOTE — Progress Notes (Signed)
Pt with dx of osteoporosis and vit B12 deficiency. Agree with injections of prolia and vit B12 today.

## 2017-03-15 ENCOUNTER — Encounter: Payer: Self-pay | Admitting: Family Medicine

## 2017-03-15 ENCOUNTER — Ambulatory Visit (INDEPENDENT_AMBULATORY_CARE_PROVIDER_SITE_OTHER): Payer: Medicare Other | Admitting: Family Medicine

## 2017-03-15 ENCOUNTER — Telehealth: Payer: Self-pay | Admitting: Family Medicine

## 2017-03-15 VITALS — BP 124/78 | HR 80 | Temp 98.3°F | Resp 16 | Ht 67.0 in | Wt 150.8 lb

## 2017-03-15 DIAGNOSIS — K529 Noninfective gastroenteritis and colitis, unspecified: Secondary | ICD-10-CM | POA: Diagnosis not present

## 2017-03-15 DIAGNOSIS — R634 Abnormal weight loss: Secondary | ICD-10-CM | POA: Diagnosis not present

## 2017-03-15 DIAGNOSIS — Z8719 Personal history of other diseases of the digestive system: Secondary | ICD-10-CM

## 2017-03-15 DIAGNOSIS — R1032 Left lower quadrant pain: Secondary | ICD-10-CM

## 2017-03-15 DIAGNOSIS — R6881 Early satiety: Secondary | ICD-10-CM | POA: Diagnosis not present

## 2017-03-15 DIAGNOSIS — Z9889 Other specified postprocedural states: Secondary | ICD-10-CM

## 2017-03-15 DIAGNOSIS — R110 Nausea: Secondary | ICD-10-CM | POA: Diagnosis not present

## 2017-03-15 LAB — CBC WITH DIFFERENTIAL/PLATELET
Basophils Absolute: 0.1 10*3/uL (ref 0.0–0.1)
Basophils Relative: 1.2 % (ref 0.0–3.0)
EOS PCT: 1.4 % (ref 0.0–5.0)
Eosinophils Absolute: 0.1 10*3/uL (ref 0.0–0.7)
HEMATOCRIT: 40 % (ref 36.0–46.0)
HEMOGLOBIN: 13.4 g/dL (ref 12.0–15.0)
LYMPHS PCT: 24 % (ref 12.0–46.0)
Lymphs Abs: 1.9 10*3/uL (ref 0.7–4.0)
MCHC: 33.5 g/dL (ref 30.0–36.0)
MCV: 94.6 fl (ref 78.0–100.0)
MONOS PCT: 11.3 % (ref 3.0–12.0)
Monocytes Absolute: 0.9 10*3/uL (ref 0.1–1.0)
Neutro Abs: 4.8 10*3/uL (ref 1.4–7.7)
Neutrophils Relative %: 62.1 % (ref 43.0–77.0)
Platelets: 276 10*3/uL (ref 150.0–400.0)
RBC: 4.23 Mil/uL (ref 3.87–5.11)
RDW: 13.6 % (ref 11.5–15.5)
WBC: 7.7 10*3/uL (ref 4.0–10.5)

## 2017-03-15 LAB — COMPREHENSIVE METABOLIC PANEL
ALT: 9 U/L (ref 0–35)
AST: 16 U/L (ref 0–37)
Albumin: 3.9 g/dL (ref 3.5–5.2)
Alkaline Phosphatase: 65 U/L (ref 39–117)
BILIRUBIN TOTAL: 0.7 mg/dL (ref 0.2–1.2)
BUN: 8 mg/dL (ref 6–23)
CALCIUM: 8.8 mg/dL (ref 8.4–10.5)
CHLORIDE: 103 meq/L (ref 96–112)
CO2: 32 meq/L (ref 19–32)
Creatinine, Ser: 0.55 mg/dL (ref 0.40–1.20)
GFR: 114.48 mL/min (ref >60.00)
GLUCOSE: 90 mg/dL (ref 70–99)
Potassium: 3.6 mEq/L (ref 3.5–5.1)
Sodium: 142 mEq/L (ref 135–145)
Total Protein: 6.8 g/dL (ref 6.0–8.3)

## 2017-03-15 LAB — TSH: TSH: 2.17 u[IU]/mL (ref 0.35–4.50)

## 2017-03-15 LAB — T4, FREE: FREE T4: 1.05 ng/dL (ref 0.60–1.60)

## 2017-03-15 LAB — H. PYLORI ANTIBODY, IGG: H Pylori IgG: POSITIVE — AB

## 2017-03-15 LAB — LIPASE: Lipase: 25 U/L (ref 11.0–59.0)

## 2017-03-15 LAB — SEDIMENTATION RATE: Sed Rate: 9 mm/hr (ref 0–30)

## 2017-03-15 MED ORDER — HYOSCYAMINE SULFATE 0.125 MG SL SUBL: SUBLINGUAL_TABLET | SUBLINGUAL | 2 refills | Status: DC

## 2017-03-15 NOTE — Progress Notes (Signed)
OFFICE VISIT  03/15/2017   CC:  Chief Complaint  Patient presents with  . Nausea   HPI:    Patient is a 76 y.o. Caucasian female who presents for nausea. Complains of about 9 mo of these sx's, but progressively worsening last couple of months. Main sx's are early satiety and nausea.  Has good appetite, though.   No abd distention.  No vomiting.  Her symptoms are not correlated with eating any specific type/category of food.  Also, stomach seems to hurt constantly in LLQ since getting her hiatal hernia repair.  Sometimes when she eats it makes the pain here worse.  Denies tremulousness or presyncope or palpitations or heart racing after eating. Has been having postprandial watery BMs nearly every time she eats.  No blood or mucous in the stool.  No constipation. Energy level worse.  Also having some esophageal dysphagia of solids, esp meats--since having her hiatal hernia surgery 05/2016.  Denies heartburn.  No fevers.  No upper abd pains. She was 176 lbs when she got her hernia surgery and is now 150 lbs.    Past Medical History:  Diagnosis Date  . Abnormal EKG 05/2016   LAFB, voltage criteria for LVH in lead aVL.  Marland Kitchen Anxiety   . Cataract    right  . Degenerative arthritis of cervical spine   . Fatty liver   . Fibromyalgia   . GERD (gastroesophageal reflux disease)   . History of hiatal hernia   . Hx: UTI (urinary tract infection)    recurrent.  Pyelo hospitalization 2012  . Hypertension   . Microhematuria 02/2012   Cystoscopy with bladder biopsy and cystouretograms--pathology benign.  . Myalgia    Hx of diffuse myalgias, onset in her 28s  . Osteoporosis 08/10/2016   T-score -2.5: started fosamax 07/2016.  Intol fosamax; started prolia 08/27/2016.  Marland Kitchen Periesophageal hiatal hernia 02/2016   Type 4  . Thickened endometrium 10/16/2011   on CT.  F/u pelvic u/s showed nabothian cyst to account for this..  No enometrial pathology noted on u/s, no ovaries visualized.  . Vertigo   .  Vitamin B12 deficiency 11/2015   + LL PN    Past Surgical History:  Procedure Laterality Date  . BACK SURGERY  1990s x 2   Dr. Leeanne Deed in [place]  . CHOLECYSTECTOMY    . COLONOSCOPY  approx 2002   Diverticulosis, int hemorrhoids ([facility])  . ESOPHAGEAL MANOMETRY N/A 04/19/2016   Normal.  Procedure: ESOPHAGEAL MANOMETRY (EM);  Surgeon: Mauri Pole, MD;  Location: [facility];  Service: Endoscopy;  Laterality: N/A;  . EYE SURGERY Right    cataract extraction with IOL  . HYSTEROSCOPY  11/2004   Cervical hystogram   . INSERTION OF MESH N/A 06/18/2016   Procedure: INSERTION OF MESH;  Surgeon: Michael Boston, MD;  Location: [facility];  Service: General;  Laterality: N/A;  . NISSEN FUNDOPLICATION  27/78/2423    Outpatient Medications Prior to Visit  Medication Sig Dispense Refill  . albuterol (VENTOLIN HFA) 108 (90 BASE) MCG/ACT inhaler Inhale 1-2 puffs into the lungs every 4 (four) hours as needed for wheezing or shortness of breath. 1 Inhaler 0  . aspirin EC 81 MG tablet Take 81 mg by mouth at bedtime.     . cyanocobalamin (,VITAMIN B-12,) 1000 MCG/ML injection Inject 1 mL (1,000 mcg total) into the muscle once. (Patient taking differently: Inject 1,000 mcg into the muscle every 30 (thirty) days. ) 1 mL 0  . DULoxetine (CYMBALTA)  60 MG capsule Take 1 capsule (60 mg total) by mouth daily. 30 capsule 3  . fexofenadine (ALLEGRA) 60 MG tablet Take 1 tablet (60 mg total) by mouth 2 (two) times daily. 60 tablet 6  . fluticasone (CUTIVATE) 0.05 % cream Apply to affected areas bid prn 30 g 1  . fluticasone (FLONASE) 50 MCG/ACT nasal spray Place 2 sprays into both nostrils daily. (Patient taking differently: Place 2 sprays into both nostrils daily as needed for allergies. ) 16 g 6  . gabapentin (NEURONTIN) 100 MG capsule Take 1 capsule (100 mg total) by mouth 3 (three) times daily. 90 capsule 1  . LORazepam (ATIVAN) 0.5 MG tablet 1-2 tabs po bid prn anxiety 60 tablet 1  . metoCLOPramide (REGLAN)  10 MG tablet Take 1 tablet (10 mg total) by mouth every 6 (six) hours as needed for nausea (nausea / vomiting). 10 tablet 5  . Multiple Vitamin (MULTIVITAMIN) tablet Take 1 tablet by mouth at bedtime.     . pantoprazole (PROTONIX) 40 MG tablet 1 tab po qd prn acid reflux 30 tablet 3  . promethazine (PHENERGAN) 25 MG suppository Place 1 suppository (25 mg total) rectally every 6 (six) hours as needed for nausea. 2 suppository 5  . cyanocobalamin ((VITAMIN B-12)) injection 1,000 mcg      No facility-administered medications prior to visit.     Allergies  Allergen Reactions  . Ciprofloxacin Nausea And Vomiting  . Codeine Nausea And Vomiting and Other (See Comments)    Feels funny  . Celebrex [Celecoxib] Rash  . Penicillins Rash    Has patient had a PCN reaction causing immediate rash, facial/tongue/throat swelling, SOB or lightheadedness with hypotension: Rash Has patient had a PCN reaction causing severe rash involving mucus membranes or skin necrosis: No Has patient had a PCN reaction that required hospitalization No Has patient had a PCN reaction occurring within the last 10 years: NO If all of the above answers are "NO", then may proceed with Cephalosporin use.    ROS Review of Systems  Constitutional: Positive for fatigue. Negative for appetite change, chills and fever.  HENT: Negative for congestion, rhinorrhea and sore throat.   Eyes: Negative for redness and visual disturbance.  Respiratory: Positive for choking (occ chokes/has severe dysphagia of meats). Negative for cough, chest tightness, shortness of breath and wheezing.   Cardiovascular: Negative for chest pain, palpitations and leg swelling.  Gastrointestinal: Positive for abdominal pain, diarrhea and nausea. Negative for abdominal distention, blood in stool, rectal pain and vomiting.  Genitourinary: Negative for dysuria, flank pain, hematuria and pelvic pain.  Musculoskeletal: Negative for back pain and joint swelling.   Skin: Negative for rash.  Neurological: Negative for dizziness, weakness and headaches.  Hematological: Negative for adenopathy.  Psychiatric/Behavioral: The patient is nervous/anxious.    PE: Blood pressure 124/78, pulse 80, temperature 98.3 F (36.8 C), temperature source Oral, resp. rate 16, height _0  (1.702 m), weight 150 lb 12 oz (68.4 kg), SpO2 97 %. Gen: Alert, well appearing.  Patient is oriented to person, place, time, and situation. AFFECT: pleasant, lucid thought and speech. ULA:GTXM: no injection, icteris, swelling, or exudate.  EOMI, PERRLA. Mouth: lips without lesion/swelling.  Oral mucosa pink and moist. Oropharynx without erythema, exudate, or swelling.  Neck - No masses or thyromegaly or limitation in range of motion CV: RRR, no m/r/g.   LUNGS: CTA bilat, nonlabored resps, good aeration in all lung fields. ABD: soft, ND, LLQ TTP w/out guarding or rebound.  BS normal.  No HSM or mass. EXT: no clubbing, cyanosis, or edema.  SKIN: no rash.  LABS:    Chemistry      Component Value Date/Time   NA 141 11/19/2016 1335   K 4.1 11/19/2016 1335   CL 104 11/19/2016 1335   CO2 31 11/19/2016 1335   BUN 10 11/19/2016 1335   CREATININE 0.62 11/19/2016 1335      Component Value Date/Time   CALCIUM 9.4 11/19/2016 1335   ALKPHOS 54 11/19/2016 1335   AST 17 11/19/2016 1335   ALT 12 11/19/2016 1335   BILITOT 0.7 11/19/2016 1335     Lab Results  Component Value Date   WBC 6.5 02/01/2017   HGB 14.9 02/01/2017   HCT 43.2 02/01/2017   MCV 94.5 02/01/2017   PLT 258.0 02/01/2017   Lab Results  Component Value Date   LIPASE 27.0 02/25/2016   IMPRESSION AND PLAN:  1) Worsening early satiety and nausea--essentially occurring since her hiatal hernia surgery 05/2016 (her pre-surgery symptoms were limited to intermittent LUQ pain).  Also with some IBS-like symptoms during this same time-frame. First, will check labs (CBC w/diff, CMET, lipase, ESR, H pylori ab,  TSH, T4 and  T3.   Also, stool eval for giardia/crypto, c diff pcr, fecal lactoferrin, and bacterial culture).  Will obtain abd u/s as first imaging test. If this is unremarkable, then I'll obtain a barium esophagram to evaluate for recurrence of hiatal hernia.  2) Left lower quadrant pain and tenderness.  Again, ever since hiatal hernia surgery.  Perplexing location of pain given her prominent upper GI symptoms, but maybe this is related to her postprandial diarrhea/IBS symptoms. Smoldering diverticulitis less likely but chronic colitis needs to be considered with her lower GI symptoms (lymphocytic colitis).  Still considering CT abd/pelv with IV contrast if abd u/s and barium swallow are unrevealing. I rx'd levsin for her to try, 0.125, 1-2 qid prn.  3) Abnormal weight loss: secondary to #1 and #2 above.  Ultimately she may have to see a GI MD or go back to her general surgeon for further evaluation.  An After Visit Summary was printed and given to the patient.  FOLLOW UP: Return for f/u to be determined after results of w/u.  Signed:  Crissie Sickles, MD           03/15/2017

## 2017-03-15 NOTE — Telephone Encounter (Signed)
Daughter calling checking status on [place] for pt. Please advise.

## 2017-03-16 ENCOUNTER — Other Ambulatory Visit: Payer: Self-pay | Admitting: Family Medicine

## 2017-03-16 LAB — T3: T3 TOTAL: 103 ng/dL (ref 76–181)

## 2017-03-16 MED ORDER — TETRACYCLINE HCL 500 MG PO CAPS: 500.0000 mg | ORAL_CAPSULE | Freq: Four times a day (QID) | ORAL | 0 refills | Status: DC

## 2017-03-16 MED ORDER — METRONIDAZOLE 250 MG PO TABS: ORAL_TABLET | ORAL | 0 refills | Status: DC

## 2017-03-16 NOTE — Telephone Encounter (Signed)
Patient scheduled 03/18/17

## 2017-03-16 NOTE — Addendum Note (Signed)
Addended by: Ralph Dowdy on: 03/16/2017 10:13 AM   Modules accepted: Orders

## 2017-03-17 LAB — CLOSTRIDIUM DIFFICILE BY PCR: Toxigenic C. Difficile by PCR: NOT DETECTED

## 2017-03-17 LAB — FECAL LACTOFERRIN, QUANT: Lactoferrin: POSITIVE

## 2017-03-18 ENCOUNTER — Ambulatory Visit (HOSPITAL_COMMUNITY)
Admission: RE | Admit: 2017-03-18 | Discharge: 2017-03-18 | Disposition: A | Discharge status: Home / Self Care | Payer: Medicare Other | Source: Ambulatory Visit | Attending: Family Medicine | Admitting: Family Medicine

## 2017-03-18 DIAGNOSIS — R634 Abnormal weight loss: Secondary | ICD-10-CM | POA: Diagnosis present

## 2017-03-18 DIAGNOSIS — K760 Fatty (change of) liver, not elsewhere classified: Secondary | ICD-10-CM | POA: Diagnosis not present

## 2017-03-18 DIAGNOSIS — Z9049 Acquired absence of other specified parts of digestive tract: Secondary | ICD-10-CM | POA: Insufficient documentation

## 2017-03-18 DIAGNOSIS — R6881 Early satiety: Secondary | ICD-10-CM | POA: Diagnosis not present

## 2017-03-18 DIAGNOSIS — R110 Nausea: Secondary | ICD-10-CM | POA: Insufficient documentation

## 2017-03-20 LAB — STOOL CULTURE

## 2017-03-24 LAB — GIARDIA/CRYPTOSPORIDIUM (EIA)

## 2017-03-28 ENCOUNTER — Ambulatory Visit (INDEPENDENT_AMBULATORY_CARE_PROVIDER_SITE_OTHER): Payer: Medicare Other

## 2017-03-28 DIAGNOSIS — E538 Deficiency of other specified B group vitamins: Secondary | ICD-10-CM | POA: Diagnosis not present

## 2017-03-28 MED ORDER — CYANOCOBALAMIN 1000 MCG/ML IJ SOLN
1000.0000 ug | Freq: Once | INTRAMUSCULAR | Status: AC
  Administered 2017-03-28: 1000 ug via INTRAMUSCULAR

## 2017-03-28 NOTE — Progress Notes (Signed)
Patient presents today for Vitamin B12 injection. Given with no problems or incidence. Patient left with no complaints. 

## 2017-04-25 ENCOUNTER — Ambulatory Visit (INDEPENDENT_AMBULATORY_CARE_PROVIDER_SITE_OTHER): Payer: Medicare Other

## 2017-04-25 DIAGNOSIS — E538 Deficiency of other specified B group vitamins: Secondary | ICD-10-CM

## 2017-04-25 MED ORDER — CYANOCOBALAMIN 1000 MCG/ML IJ SOLN
1000.0000 ug | Freq: Once | INTRAMUSCULAR | Status: AC
  Administered 2017-04-25: 1000 ug via INTRAMUSCULAR

## 2017-04-25 NOTE — Progress Notes (Signed)
Patient presents today for Vitamin B12 injection. Given with no incidence or problems. Patient left with no complaints. 

## 2017-05-11 NOTE — Progress Notes (Signed)
Pt with vit B12 deficiency. Agree with vit B12 injection in office today.  Signed:  Crissie Sickles, MD           05/11/2017

## 2017-05-20 ENCOUNTER — Ambulatory Visit (INDEPENDENT_AMBULATORY_CARE_PROVIDER_SITE_OTHER): Payer: Medicare Other | Admitting: Family Medicine

## 2017-05-20 ENCOUNTER — Encounter: Payer: Self-pay | Admitting: Family Medicine

## 2017-05-20 VITALS — BP 127/78 | HR 61 | Temp 97.7°F | Resp 16 | Ht 67.0 in | Wt 145.8 lb

## 2017-05-20 DIAGNOSIS — N814 Uterovaginal prolapse, unspecified: Secondary | ICD-10-CM | POA: Diagnosis not present

## 2017-05-20 NOTE — Progress Notes (Signed)
OFFICE VISIT  05/20/2017   CC:  Chief Complaint  Patient presents with  . Referral    feels like her bladder is dropping   HPI:    Patient is a 76 y.o. Caucasian female who presents for urinary symptoms.  Describes odd feeling of fullness in vaginal region that just started in the last couple of days.  Yesterday it hurt to wipe after urinating.  She then looked at her vagina in the mirror and says she saw "something that was sticking out" or "wasn't supposed to be there".  No dysuria, urgency, or urinary frequency.  No constipation or diarrhea.  No vaginal bleeding. Has never been on estrogen cream or pills.  No hx of any pelvic surgery.  ROS: no abd pain, no n/v, no fevers  Past Medical History:  Diagnosis Date  . Abnormal EKG 05/2016   LAFB, voltage criteria for LVH in lead aVL.  Marland Kitchen Anxiety   . Cataract    right  . Degenerative arthritis of cervical spine   . Fatty liver   . Fibromyalgia   . GERD (gastroesophageal reflux disease)   . History of hiatal hernia   . Hx: UTI (urinary tract infection)    recurrent.  Pyelo hospitalization 2012  . Hypertension   . Microhematuria 02/2012   Cystoscopy with bladder biopsy and cystouretograms--pathology benign.  . Myalgia    Hx of diffuse myalgias, onset in her 62s  . Osteoporosis 08/10/2016   T-score -2.5: started fosamax 07/2016.  Intol fosamax; started prolia 08/27/2016.  Marland Kitchen Periesophageal hiatal hernia 02/2016   Type 4  . Thickened endometrium 10/16/2011   on CT.  F/u pelvic u/s showed nabothian cyst to account for this..  No enometrial pathology noted on u/s, no ovaries visualized.  . Vertigo   . Vitamin B12 deficiency 11/2015   + LL PN    Past Surgical History:  Procedure Laterality Date  . BACK SURGERY  1990s x 2   Dr. Leeanne Deed in [place]  . CHOLECYSTECTOMY    . COLONOSCOPY  approx 2002   Diverticulosis, int hemorrhoids ([facility])  . ESOPHAGEAL MANOMETRY N/A 04/19/2016   Normal.  Procedure: ESOPHAGEAL MANOMETRY (EM);   Surgeon: Mauri Pole, MD;  Location: [facility];  Service: Endoscopy;  Laterality: N/A;  . EYE SURGERY Right    cataract extraction with IOL  . HYSTEROSCOPY  11/2004   Cervical hystogram   . INSERTION OF MESH N/A 06/18/2016   Procedure: INSERTION OF MESH;  Surgeon: Michael Boston, MD;  Location: [facility];  Service: General;  Laterality: N/A;  . NISSEN FUNDOPLICATION  76/72/0947    Outpatient Medications Prior to Visit  Medication Sig Dispense Refill  . albuterol (VENTOLIN HFA) 108 (90 BASE) MCG/ACT inhaler Inhale 1-2 puffs into the lungs every 4 (four) hours as needed for wheezing or shortness of breath. 1 Inhaler 0  . aspirin EC 81 MG tablet Take 81 mg by mouth at bedtime.     . cyanocobalamin (,VITAMIN B-12,) 1000 MCG/ML injection Inject 1 mL (1,000 mcg total) into the muscle once. (Patient taking differently: Inject 1,000 mcg into the muscle every 30 (thirty) days. ) 1 mL 0  . DULoxetine (CYMBALTA) 60 MG capsule Take 1 capsule (60 mg total) by mouth daily. 30 capsule 3  . fexofenadine (ALLEGRA) 60 MG tablet Take 1 tablet (60 mg total) by mouth 2 (two) times daily. 60 tablet 6  . fluticasone (CUTIVATE) 0.05 % cream Apply to affected areas bid prn 30 g 1  .  fluticasone (FLONASE) 50 MCG/ACT nasal spray Place 2 sprays into both nostrils daily. (Patient taking differently: Place 2 sprays into both nostrils daily as needed for allergies. ) 16 g 6  . gabapentin (NEURONTIN) 100 MG capsule Take 1 capsule (100 mg total) by mouth 3 (three) times daily. 90 capsule 1  . hyoscyamine (LEVSIN SL) 0.125 MG SL tablet 1-2 tabs po qid prn abd cramps/diarrhea after eating. 30 tablet 2  . LORazepam (ATIVAN) 0.5 MG tablet 1-2 tabs po bid prn anxiety 60 tablet 1  . metoCLOPramide (REGLAN) 10 MG tablet Take 1 tablet (10 mg total) by mouth every 6 (six) hours as needed for nausea (nausea / vomiting). 10 tablet 5  . metroNIDAZOLE (FLAGYL) 250 MG tablet 1 tab po qid x 14 days 56 tablet 0  . Multiple Vitamin  (MULTIVITAMIN) tablet Take 1 tablet by mouth at bedtime.     . pantoprazole (PROTONIX) 40 MG tablet 1 tab po qd prn acid reflux 30 tablet 3  . promethazine (PHENERGAN) 25 MG suppository Place 1 suppository (25 mg total) rectally every 6 (six) hours as needed for nausea. 2 suppository 5  . tetracycline (ACHROMYCIN,SUMYCIN) 500 MG capsule Take 1 capsule (500 mg total) by mouth 4 (four) times daily. 56 capsule 0   No facility-administered medications prior to visit.     Allergies  Allergen Reactions  . Ciprofloxacin Nausea And Vomiting  . Codeine Nausea And Vomiting and Other (See Comments)    Feels funny  . Celebrex [Celecoxib] Rash  . Penicillins Rash    Has patient had a PCN reaction causing immediate rash, facial/tongue/throat swelling, SOB or lightheadedness with hypotension: Rash Has patient had a PCN reaction causing severe rash involving mucus membranes or skin necrosis: No Has patient had a PCN reaction that required hospitalization No Has patient had a PCN reaction occurring within the last 10 years: NO If all of the above answers are "NO", then may proceed with Cephalosporin use.    ROS As per HPI  PE: Blood pressure 127/78, pulse 61, temperature 97.7 F (36.5 C), temperature source Oral, resp. rate 16, height 5\' 7"  (1.702 m), weight 145 lb 12 oz (66.1 kg), SpO2 97 %.  Pt examined with Sharen Hones, CMA, as chaperone.  Gen: Alert, well appearing.  Patient is oriented to person, place, time, and situation. AFFECT: pleasant, lucid thought and speech. Pelvic exam: vulva and labia normal.  Urethral meatus normal.  With cough I can see prolapse of vaginal walls. With speculum exam the vaginal walls have normal appearance, as does the cervix.  When I slowly removed the speculum, the cervix came up the vaginal vault nearly to the introitus.  This was easily pushed back in with the speculum. Rectal: no weakening of posterior vaginal wall was palpable.  LABS:  none  IMPRESSION  AND PLAN:  Vaginal and cervical prolapse, easily reducible. Pt pretty uncomfortable with this. Will refer to GYN for further eval/mgmt---[facility] in [place].  Spent 25 min with pt today, with >50% of this time spent in counseling and care coordination regarding the above problems.  Discussed dx with pt, discussed alternative possible dx, explained pathophys underlying this type of problem.  We talked about general treatments, ranging from watchful waiting approach to surgical correction. I answered pt's questions today to the best of my ability.  An After Visit Summary was printed and given to the patient.  FOLLOW UP: Return if symptoms worsen or fail to improve.  Signed:  Crissie Sickles, MD  05/20/2017      

## 2017-05-27 ENCOUNTER — Ambulatory Visit (INDEPENDENT_AMBULATORY_CARE_PROVIDER_SITE_OTHER): Payer: Medicare Other | Admitting: Family Medicine

## 2017-05-27 DIAGNOSIS — E538 Deficiency of other specified B group vitamins: Secondary | ICD-10-CM | POA: Diagnosis not present

## 2017-05-27 MED ORDER — CYANOCOBALAMIN 1000 MCG/ML IJ SOLN
1000.0000 ug | Freq: Once | INTRAMUSCULAR | Status: AC
  Administered 2017-05-27: 1000 ug via INTRAMUSCULAR

## 2017-05-27 NOTE — Progress Notes (Signed)
Patient presents to office for B12 injection.  Patient tolerated injection well.

## 2017-06-02 ENCOUNTER — Encounter: Payer: Self-pay | Admitting: Obstetrics & Gynecology

## 2017-06-02 ENCOUNTER — Ambulatory Visit (INDEPENDENT_AMBULATORY_CARE_PROVIDER_SITE_OTHER): Payer: Medicare Other | Admitting: Obstetrics & Gynecology

## 2017-06-02 VITALS — BP 172/100 | HR 70 | Ht 66.0 in | Wt 142.5 lb

## 2017-06-02 DIAGNOSIS — R809 Proteinuria, unspecified: Secondary | ICD-10-CM | POA: Diagnosis not present

## 2017-06-02 DIAGNOSIS — N8110 Cystocele, unspecified: Secondary | ICD-10-CM | POA: Diagnosis not present

## 2017-06-02 DIAGNOSIS — R319 Hematuria, unspecified: Secondary | ICD-10-CM

## 2017-06-02 LAB — POCT URINALYSIS DIPSTICK
Glucose, UA: NEGATIVE
Ketones, UA: NEGATIVE
NITRITE UA: NEGATIVE

## 2017-06-02 NOTE — Progress Notes (Signed)
Chief Complaint  Patient presents with  . "something has dropped"    feels full in stomach; back pain    Blood pressure (!) 172/100, pulse 70, height 5\' 6"  (1.676 m), weight 142 lb 8 oz (64.6 kg).  76 y.o. [id number] No LMP recorded. Patient is postmenopausal. The current method of family planning is post menopausal status.  Outpatient Encounter Prescriptions as of 06/02/2017  Medication Sig Note  . albuterol (VENTOLIN HFA) 108 (90 BASE) MCG/ACT inhaler Inhale 1-2 puffs into the lungs every 4 (four) hours as needed for wheezing or shortness of breath.   Marland Kitchen aspirin EC 81 MG tablet Take 81 mg by mouth at bedtime.    . cyanocobalamin (,VITAMIN B-12,) 1000 MCG/ML injection Inject 1 mL (1,000 mcg total) into the muscle once. (Patient taking differently: Inject 1,000 mcg into the muscle every 30 (thirty) days. ) 06/11/2016: Last injection 06/09/2016  . DULoxetine (CYMBALTA) 60 MG capsule Take 1 capsule (60 mg total) by mouth daily. (Patient taking differently: Take 30 mg by mouth daily. )   . fexofenadine (ALLEGRA) 60 MG tablet Take 1 tablet (60 mg total) by mouth 2 (two) times daily. (Patient taking differently: Take 60 mg by mouth daily. )   . fluticasone (FLONASE) 50 MCG/ACT nasal spray Place 2 sprays into both nostrils daily. (Patient taking differently: Place 2 sprays into both nostrils daily as needed for allergies. )   . gabapentin (NEURONTIN) 100 MG capsule Take 1 capsule (100 mg total) by mouth 3 (three) times daily.   . metoCLOPramide (REGLAN) 10 MG tablet Take 1 tablet (10 mg total) by mouth every 6 (six) hours as needed for nausea (nausea / vomiting).   . pantoprazole (PROTONIX) 40 MG tablet 1 tab po qd prn acid reflux   . [DISCONTINUED] fluticasone (CUTIVATE) 0.05 % cream Apply to affected areas bid prn   . [DISCONTINUED] hyoscyamine (LEVSIN SL) 0.125 MG SL tablet 1-2 tabs po qid prn abd cramps/diarrhea after eating.   . [DISCONTINUED] LORazepam (ATIVAN) 0.5 MG tablet 1-2 tabs po bid  prn anxiety   . [DISCONTINUED] metroNIDAZOLE (FLAGYL) 250 MG tablet 1 tab po qid x 14 days   . [DISCONTINUED] Multiple Vitamin (MULTIVITAMIN) tablet Take 1 tablet by mouth at bedtime.    . [DISCONTINUED] promethazine (PHENERGAN) 25 MG suppository Place 1 suppository (25 mg total) rectally every 6 (six) hours as needed for nausea.   . [DISCONTINUED] tetracycline (ACHROMYCIN,SUMYCIN) 500 MG capsule Take 1 capsule (500 mg total) by mouth 4 (four) times daily.    No facility-administered encounter medications on file as of 06/02/2017.     Subjective Karen Delacruz is seen as a referral from Dr. Penni Homans She states that couple weeks ago she had a feeling of pressure discomfort and thought that maybe something was falling got a mirror NSAID indeed something was falling was unsure what Dr. Penni Homans had her come to see me for evaluation  Objective General WDWN female NAD Vulva:  normal appearing vulva with no masses, tenderness or lesions Vagina:  Atrophic, grade 3 anterior vaginal prolapse or cystocele The uterus is sort of being pulled down by the bladder but is well supported when the bladder supported and she has excellent  rectal support Cervix:  no lesions Uterus:  normal size, contour, position, consistency, mobility, non-tender Adnexa: ovaries:present,  normal adnexa in size, nontender and no masses, no masses  She's formally fitted for pessary Milex ring support #3 is comfortable and is well fitting even with straining  I discussed with the patient the problem possibility that she may have some urinary loss because of now's giving support to the bladder   Pertinent ROS No burning with urination, frequency or urgency No nausea, vomiting or diarrhea Nor fever chills or other constitutional symptoms   Labs or studies none    Impression Diagnoses this Encounter::   ICD-10-CM   1. POP-Q stage 3 cystocele N81.10   2. Proteinuria, unspecified type R80.9 POCT Urinalysis Dipstick  3. Hematuria,  unspecified type R31.9 POCT Urinalysis Dipstick    Established relevant diagnosis(es):   Plan/Recommendations: No orders of the defined types were placed in this encounter.   Labs or Scans Ordered: Orders Placed This Encounter  Procedures  . POCT Urinalysis Dipstick    Management:: Karen Delacruz is fit for a Milex ring with support #3 today #4 was a little bit too big and #2 would of course be 2 small It is ordered and we will see her back next week to place it and then see her back a month after that to evaluate her vaginal response to the pessary  Follow up Return in about 1 week (around 06/09/2017) for Follow up, with Dr Elonda Husky.        Face to face time:  20 minutes  Greater than 50% of the visit time was spent in counseling and coordination of care with the patient.  The summary and outline of the counseling and care coordination is summarized in the note above.   All questions were answered.  Past Medical History:  Diagnosis Date  . Abnormal EKG 05/2016   LAFB, voltage criteria for LVH in lead aVL.  Marland Kitchen Anxiety   . Cataract    right  . Degenerative arthritis of cervical spine   . Fatty liver   . Fibromyalgia   . GERD (gastroesophageal reflux disease)   . History of hiatal hernia   . Hx: UTI (urinary tract infection)    recurrent.  Pyelo hospitalization 2012  . Hypertension   . Microhematuria 02/2012   Cystoscopy with bladder biopsy and cystouretograms--pathology benign.  . Myalgia    Hx of diffuse myalgias, onset in her 37s  . Osteoporosis 08/10/2016   T-score -2.5: started fosamax 07/2016.  Intol fosamax; started prolia 08/27/2016.  Marland Kitchen Periesophageal hiatal hernia 02/2016   Type 4  . Thickened endometrium 10/16/2011   on CT.  F/u pelvic u/s showed nabothian cyst to account for this..  No enometrial pathology noted on u/s, no ovaries visualized.  . Vertigo   . Vitamin B12 deficiency 11/2015   + LL PN    Past Surgical History:  Procedure Laterality Date  . BACK  SURGERY  1990s x 2   Dr. Leeanne Deed in [place]  . CHOLECYSTECTOMY    . COLONOSCOPY  approx 2002   Diverticulosis, int hemorrhoids ([facility])  . ESOPHAGEAL MANOMETRY N/A 04/19/2016   Normal.  Procedure: ESOPHAGEAL MANOMETRY (EM);  Surgeon: Mauri Pole, MD;  Location: [facility];  Service: Endoscopy;  Laterality: N/A;  . EYE SURGERY Right    cataract extraction with IOL  . HYSTEROSCOPY  11/2004   Cervical hystogram   . INSERTION OF MESH N/A 06/18/2016   Procedure: INSERTION OF MESH;  Surgeon: Michael Boston, MD;  Location: [facility];  Service: General;  Laterality: N/A;  . NISSEN FUNDOPLICATION  44/31/5400    OB History    Gravida Para Term Preterm AB Living   2 2 2     2    SAB TAB Ectopic  Multiple Live Births           2      Allergies  Allergen Reactions  . Ciprofloxacin Nausea And Vomiting  . Codeine Nausea And Vomiting and Other (See Comments)    Feels funny  . Celebrex [Celecoxib] Rash  . Penicillins Rash    Has patient had a PCN reaction causing immediate rash, facial/tongue/throat swelling, SOB or lightheadedness with hypotension: Rash Has patient had a PCN reaction causing severe rash involving mucus membranes or skin necrosis: No Has patient had a PCN reaction that required hospitalization No Has patient had a PCN reaction occurring within the last 10 years: NO If all of the above answers are "NO", then may proceed with Cephalosporin use.    Social History   Social History  . Marital status: Married    Spouse name: N/A  . Number of children: N/A  . Years of education: N/A   Social History Main Topics  . Smoking status: Never Smoker  . Smokeless tobacco: Never Used  . Alcohol use No  . Drug use: No  . Sexual activity: Yes    Birth control/ protection: Post-menopausal   Other Topics Concern  . None   Social History Narrative   Married, lives with husband in [place].  Has two grown children.   Retired from [employer], then un-retired to go back  to same job--full time.   No t/a/d.   No formal exercise.   Normal american diet.    Family History  Problem Relation Age of Onset  . Cancer Mother        bladder  . Other Father        had bladder issues

## 2017-06-02 NOTE — Addendum Note (Signed)
Addended by: Linton Rump on: 06/02/2017 12:35 PM   Modules accepted: Orders

## 2017-06-04 LAB — URINE CULTURE

## 2017-06-06 ENCOUNTER — Encounter: Payer: Self-pay | Admitting: Family Medicine

## 2017-06-06 ENCOUNTER — Telehealth: Payer: Self-pay | Admitting: Family Medicine

## 2017-06-06 DIAGNOSIS — Z1211 Encounter for screening for malignant neoplasm of colon: Secondary | ICD-10-CM

## 2017-06-06 NOTE — Telephone Encounter (Signed)
Order placed

## 2017-06-06 NOTE — Telephone Encounter (Signed)
OK, this is fine.

## 2017-06-06 NOTE — Progress Notes (Signed)
Pt with vit B12 def. Agree with vit B12 1000 mcg IM in office today.

## 2017-06-06 NOTE — Telephone Encounter (Signed)
Karen Delacruz, will you please put in an order. Thanks.

## 2017-06-06 NOTE — Telephone Encounter (Signed)
Patient got Emmi call today and would like to get cologuard instead of colonoscopy please.

## 2017-06-06 NOTE — Telephone Encounter (Signed)
Please advise. Thanks.  

## 2017-06-06 NOTE — Telephone Encounter (Signed)
Pt advised and voiced understanding.   

## 2017-06-09 ENCOUNTER — Other Ambulatory Visit (HOSPITAL_COMMUNITY)
Admission: RE | Admit: 2017-06-09 | Discharge: 2017-06-09 | Disposition: A | Discharge status: Home / Self Care | Payer: Medicare Other | Source: Ambulatory Visit | Attending: Obstetrics & Gynecology | Admitting: Obstetrics & Gynecology

## 2017-06-09 ENCOUNTER — Encounter: Payer: Self-pay | Admitting: Obstetrics & Gynecology

## 2017-06-09 ENCOUNTER — Ambulatory Visit (INDEPENDENT_AMBULATORY_CARE_PROVIDER_SITE_OTHER): Payer: Medicare Other | Admitting: Obstetrics & Gynecology

## 2017-06-09 VITALS — BP 140/70 | HR 78 | Wt 144.0 lb

## 2017-06-09 DIAGNOSIS — N8110 Cystocele, unspecified: Secondary | ICD-10-CM

## 2017-06-09 DIAGNOSIS — N814 Uterovaginal prolapse, unspecified: Secondary | ICD-10-CM | POA: Diagnosis not present

## 2017-06-09 DIAGNOSIS — Z124 Encounter for screening for malignant neoplasm of cervix: Secondary | ICD-10-CM | POA: Diagnosis present

## 2017-06-09 NOTE — Progress Notes (Signed)
Chief Complaint  Patient presents with  . Follow-up    for pessary   Blood pressure 140/70, pulse 78, weight 144 lb (65.3 kg).  Karen Delacruz is here today to have her pessary placed Please refer to my note 06/02/2017 where I fit her for the pessary at that time She has a grade 3 cystocele The uterus itself is sort of being pulled down by the descending bladder and is not the primary problem  She was fit for a Milex ring with support #3  It is placed today without difficulty and the patient came in to my office and says she feels like there is less pressure there It is not uncomfortable  I'll see her back in 1 month to evaluate her mucosal response and to see if it still a reasonable fit In the meantime if it were to fall out time she is going to keep it bring it back into the office  Florian Buff, MD 06/09/2017 10:53 AM     Face to face time:  15 minutes  Greater than 50% of the visit time was spent in counseling and coordination of care with the patient.  The summary and outline of the counseling and care coordination is summarized in the note above.   All questions were answered.

## 2017-06-13 LAB — CYTOLOGY - PAP
Adequacy: ABSENT
DIAGNOSIS: NEGATIVE

## 2017-06-25 DIAGNOSIS — Z1211 Encounter for screening for malignant neoplasm of colon: Secondary | ICD-10-CM

## 2017-06-25 HISTORY — DX: Encounter for screening for malignant neoplasm of colon: Z12.11

## 2017-06-28 ENCOUNTER — Ambulatory Visit (INDEPENDENT_AMBULATORY_CARE_PROVIDER_SITE_OTHER): Payer: Medicare Other | Admitting: Family Medicine

## 2017-06-28 ENCOUNTER — Encounter: Payer: Self-pay | Admitting: Family Medicine

## 2017-06-28 DIAGNOSIS — E538 Deficiency of other specified B group vitamins: Secondary | ICD-10-CM

## 2017-06-28 MED ORDER — CYANOCOBALAMIN 1000 MCG/ML IJ SOLN
1000.0000 ug | INTRAMUSCULAR | Status: DC
  Administered 2017-06-28 – 2017-07-29 (×2): 1000 ug via INTRAMUSCULAR

## 2017-06-28 NOTE — Progress Notes (Signed)
Patient presented to office for B12 injection.  Patient tolerated injection well.

## 2017-06-28 NOTE — Progress Notes (Signed)
Pt with vit B12 deficiency. Agree with vit B12 injection IM today.  Signed:  Crissie Sickles, MD           06/28/2017

## 2017-07-11 ENCOUNTER — Ambulatory Visit (INDEPENDENT_AMBULATORY_CARE_PROVIDER_SITE_OTHER): Payer: Medicare Other | Admitting: Obstetrics & Gynecology

## 2017-07-11 ENCOUNTER — Encounter: Payer: Self-pay | Admitting: Obstetrics & Gynecology

## 2017-07-11 VITALS — BP 122/80 | HR 73 | Ht 68.5 in | Wt 147.0 lb

## 2017-07-11 DIAGNOSIS — N814 Uterovaginal prolapse, unspecified: Secondary | ICD-10-CM | POA: Diagnosis not present

## 2017-07-11 NOTE — Progress Notes (Signed)
Blood pressure 122/80, pulse 73, height 5' 8.5" (1.74 m), weight 147 lb (66.7 kg).   Chief Complaint  Patient presents with  . Follow-up    on pessary; it's working good!!     Karen Delacruz is in today for her initial follow-up visit from placement of her pessary She was fitted with a Milex ring with support #3 on 06/09/2017 So she has had it in now for 1 month She states she is doing great no problems minimal discharge no bleeding Her pelvic pressure and pain is dramatically better and she says that she was able to walk around and a recent local vacation much more than she had in the past  On exam the Milex ring is in appropriate place The vagina is without any evidence of mucosal breakdown there is minimal discharge and no bleeding The pessary is removed and cleaned and replaced without difficulty  Patient is doing well with the Milex ring with support #3 pessary and we will continue that she use  I will see her back in 3 months for her next follow-up visit or prior if she needs me  Florian Buff, MD 07/11/2017 11:17 AM

## 2017-07-12 LAB — COLOGUARD

## 2017-07-18 ENCOUNTER — Encounter: Payer: Self-pay | Admitting: Family Medicine

## 2017-07-19 LAB — COLOGUARD: COLOGUARD: POSITIVE

## 2017-07-20 ENCOUNTER — Encounter: Payer: Self-pay | Admitting: Family Medicine

## 2017-07-21 ENCOUNTER — Encounter: Payer: Self-pay | Admitting: Family Medicine

## 2017-07-21 ENCOUNTER — Telehealth: Payer: Self-pay | Admitting: Family Medicine

## 2017-07-21 DIAGNOSIS — R195 Other fecal abnormalities: Secondary | ICD-10-CM

## 2017-07-21 NOTE — Telephone Encounter (Signed)
Pls notify pt that her cologuard test for colon cancer screening came back positive. This does not mean she has colon cancer, but it means she needs a colonoscopy to rule this out. Pls see if she wants referral to Dr. Laural Golden in [place] or a GI in [place].  Let me know and I'll submit referral.

## 2017-07-21 NOTE — Telephone Encounter (Signed)
Left message for pt to call back  °

## 2017-07-25 NOTE — Telephone Encounter (Signed)
Patient advised of results, pt would like to go to Dr Laural Golden in [place] please.

## 2017-07-25 NOTE — Telephone Encounter (Signed)
OK, referral to Dr. Laural Golden ordered.

## 2017-07-28 ENCOUNTER — Ambulatory Visit: Payer: Medicare Other

## 2017-07-28 ENCOUNTER — Telehealth (INDEPENDENT_AMBULATORY_CARE_PROVIDER_SITE_OTHER): Payer: Self-pay | Admitting: *Deleted

## 2017-07-28 ENCOUNTER — Encounter (INDEPENDENT_AMBULATORY_CARE_PROVIDER_SITE_OTHER): Payer: Self-pay | Admitting: *Deleted

## 2017-07-28 ENCOUNTER — Encounter (INDEPENDENT_AMBULATORY_CARE_PROVIDER_SITE_OTHER): Payer: Self-pay | Admitting: Internal Medicine

## 2017-07-28 ENCOUNTER — Ambulatory Visit (INDEPENDENT_AMBULATORY_CARE_PROVIDER_SITE_OTHER): Payer: Medicare Other | Admitting: Internal Medicine

## 2017-07-28 ENCOUNTER — Other Ambulatory Visit (INDEPENDENT_AMBULATORY_CARE_PROVIDER_SITE_OTHER): Payer: Self-pay | Admitting: Internal Medicine

## 2017-07-28 VITALS — BP 156/90 | HR 60 | Temp 98.0°F | Ht 68.0 in | Wt 147.9 lb

## 2017-07-28 DIAGNOSIS — R195 Other fecal abnormalities: Secondary | ICD-10-CM | POA: Insufficient documentation

## 2017-07-28 MED ORDER — PEG 3350-KCL-NA BICARB-NACL 420 G PO SOLR: 4000.0000 mL | Freq: Once | ORAL | 0 refills | Status: AC

## 2017-07-28 NOTE — Progress Notes (Signed)
Subjective:    Patient ID: Karen Delacruz, female    DOB: Dec 13, 1940, 76 y.o.   MRN: [id number] PCP Dr. Anitra Lauth.  HPI  Referred by Dr. Anitra Lauth for positive cologuard.  Her last colonoscopy was ? 9 yrs ago. She denies seeing any blood. She says her stools are black. Black stools for a couple of months. She had a hiatal hernia repair last year by Dr. Johney Maine. Has a BM daily or every other day. Appetite is pretty good. No weight loss in the last 6 months. No family hx of colon cancer.    Review of Systems Past Medical History:  Diagnosis Date  . Abnormal EKG 05/2016   LAFB, voltage criteria for LVH in lead aVL.  Marland Kitchen Anxiety   . Cataract    right  . Colon cancer screening 06/2017   Cologuard positive--referral to GI  . Cystocele, unspecified (CODE) 2018   ? with vaginal prolapse.  Pessary fitted at Dr. Brynda [facility]:  Milex ring with support #3--great improvement.  . Degenerative arthritis of cervical spine   . Fatty liver   . Fibromyalgia   . GERD (gastroesophageal reflux disease)   . History of hiatal hernia   . Hx: UTI (urinary tract infection)    recurrent.  Pyelo hospitalization 2012  . Hypertension   . Microhematuria 02/2012   Cystoscopy with bladder biopsy and cystouretograms--pathology benign.  . Myalgia    Hx of diffuse myalgias, onset in her 70s  . Osteoporosis 08/10/2016   T-score -2.5: started fosamax 07/2016.  Intol fosamax; started prolia 08/27/2016.  Marland Kitchen Periesophageal hiatal hernia 02/2016   Type 4  . Thickened endometrium 10/16/2011   on CT.  F/u pelvic u/s showed nabothian cyst to account for this..  No enometrial pathology noted on u/s, no ovaries visualized.  . Vertigo   . Vitamin B12 deficiency 11/2015   + LL PN    Past Surgical History:  Procedure Laterality Date  . BACK SURGERY  1990s x 2   Dr. Leeanne Deed in [place]  . CHOLECYSTECTOMY    . COLONOSCOPY  approx 2002   Diverticulosis, int hemorrhoids ([facility])  . ESOPHAGEAL MANOMETRY N/A 04/19/2016   Normal.   Procedure: ESOPHAGEAL MANOMETRY (EM);  Surgeon: Mauri Pole, MD;  Location: [facility];  Service: Endoscopy;  Laterality: N/A;  . EYE SURGERY Right    cataract extraction with IOL  . HYSTEROSCOPY  11/2004   Cervical hystogram   . INSERTION OF MESH N/A 06/18/2016   Procedure: INSERTION OF MESH;  Surgeon: Michael Boston, MD;  Location: [facility];  Service: General;  Laterality: N/A;  . NISSEN FUNDOPLICATION  24/26/8341    Allergies  Allergen Reactions  . Ciprofloxacin Nausea And Vomiting  . Codeine Nausea And Vomiting and Other (See Comments)    Feels funny  . Celebrex [Celecoxib] Rash  . Penicillins Rash    Has patient had a PCN reaction causing immediate rash, facial/tongue/throat swelling, SOB or lightheadedness with hypotension: Rash Has patient had a PCN reaction causing severe rash involving mucus membranes or skin necrosis: No Has patient had a PCN reaction that required hospitalization No Has patient had a PCN reaction occurring within the last 10 years: NO If all of the above answers are "NO", then may proceed with Cephalosporin use.    Current Outpatient Prescriptions on File Prior to Visit  Medication Sig Dispense Refill  . albuterol (VENTOLIN HFA) 108 (90 BASE) MCG/ACT inhaler Inhale 1-2 puffs into the lungs every 4 (four) hours as needed  for wheezing or shortness of breath. 1 Inhaler 0  . aspirin EC 81 MG tablet Take 81 mg by mouth. Every other day    . cyanocobalamin (,VITAMIN B-12,) 1000 MCG/ML injection Inject 1 mL (1,000 mcg total) into the muscle once. (Patient taking differently: Inject 1,000 mcg into the muscle every 30 (thirty) days. ) 1 mL 0  . DULoxetine (CYMBALTA) 60 MG capsule Take 1 capsule (60 mg total) by mouth daily. (Patient taking differently: Take 30 mg by mouth daily. ) 30 capsule 3  . fexofenadine (ALLEGRA) 60 MG tablet Take 1 tablet (60 mg total) by mouth 2 (two) times daily. 60 tablet 6  . fluticasone (FLONASE) 50 MCG/ACT nasal spray Place 2 sprays  into both nostrils daily. (Patient taking differently: Place 2 sprays into both nostrils as needed. ) 16 g 6  . gabapentin (NEURONTIN) 100 MG capsule Take 1 capsule (100 mg total) by mouth 3 (three) times daily. 90 capsule 1  . metoCLOPramide (REGLAN) 10 MG tablet Take 1 tablet (10 mg total) by mouth every 6 (six) hours as needed for nausea (nausea / vomiting). 10 tablet 5  . pantoprazole (PROTONIX) 40 MG tablet 1 tab po qd prn acid reflux 30 tablet 3   Current Facility-Administered Medications on File Prior to Visit  Medication Dose Route Frequency Provider Last Rate Last Dose  . cyanocobalamin ((VITAMIN B-12)) injection 1,000 mcg  1,000 mcg Intramuscular Q30 days Tammi Sou, MD   1,000 mcg at 06/28/17 4268        Objective:   Physical Exam  Alert and oriented. Skin warm and dry. Oral mucosa is moist.   . Sclera anicteric, conjunctivae is pink. Thyroid not enlarged. No cervical lymphadenopathy. Lungs clear. Heart regular rate and rhythm.  Abdomen is soft. Bowel sounds are positive. No hepatomegaly. No abdominal masses felt. No tenderness.  No edema to lower extremities.   Stool brown and guaiac negative.         Assessment & Plan:  Positive cologuard. Colonic neoplasm needs to be ruled out.  The risks of bleeding, perforation and infection were reviewed with patient.

## 2017-07-28 NOTE — Telephone Encounter (Signed)
Patient needs trilyte 

## 2017-07-28 NOTE — Patient Instructions (Signed)
Colonoscopy. The risks of bleeding, perforation and infection were reviewed with patient.  

## 2017-07-29 ENCOUNTER — Ambulatory Visit (INDEPENDENT_AMBULATORY_CARE_PROVIDER_SITE_OTHER): Payer: Medicare Other

## 2017-07-29 ENCOUNTER — Ambulatory Visit (INDEPENDENT_AMBULATORY_CARE_PROVIDER_SITE_OTHER): Payer: Medicare Other | Admitting: Family Medicine

## 2017-07-29 DIAGNOSIS — Z23 Encounter for immunization: Secondary | ICD-10-CM

## 2017-07-29 DIAGNOSIS — E538 Deficiency of other specified B group vitamins: Secondary | ICD-10-CM | POA: Diagnosis not present

## 2017-07-29 NOTE — Progress Notes (Signed)
Patient arrived today for B12 injection.  Patient tolerated well.

## 2017-08-11 ENCOUNTER — Encounter (HOSPITAL_COMMUNITY): Payer: Self-pay | Admitting: *Deleted

## 2017-08-11 ENCOUNTER — Encounter (HOSPITAL_COMMUNITY)
Admission: RE | Disposition: A | Discharge status: Home / Self Care | Payer: Self-pay | Source: Ambulatory Visit | Attending: Internal Medicine

## 2017-08-11 ENCOUNTER — Ambulatory Visit (HOSPITAL_COMMUNITY)
Admission: RE | Admit: 2017-08-11 | Discharge: 2017-08-11 | Disposition: A | Discharge status: Home / Self Care | Payer: Medicare Other | Source: Ambulatory Visit | Attending: Internal Medicine | Admitting: Internal Medicine

## 2017-08-11 DIAGNOSIS — F419 Anxiety disorder, unspecified: Secondary | ICD-10-CM | POA: Diagnosis not present

## 2017-08-11 DIAGNOSIS — K5730 Diverticulosis of large intestine without perforation or abscess without bleeding: Secondary | ICD-10-CM | POA: Insufficient documentation

## 2017-08-11 DIAGNOSIS — K644 Residual hemorrhoidal skin tags: Secondary | ICD-10-CM | POA: Diagnosis not present

## 2017-08-11 DIAGNOSIS — Z79899 Other long term (current) drug therapy: Secondary | ICD-10-CM | POA: Diagnosis not present

## 2017-08-11 DIAGNOSIS — D123 Benign neoplasm of transverse colon: Secondary | ICD-10-CM | POA: Diagnosis not present

## 2017-08-11 DIAGNOSIS — R195 Other fecal abnormalities: Secondary | ICD-10-CM | POA: Insufficient documentation

## 2017-08-11 DIAGNOSIS — K621 Rectal polyp: Secondary | ICD-10-CM | POA: Diagnosis not present

## 2017-08-11 DIAGNOSIS — I10 Essential (primary) hypertension: Secondary | ICD-10-CM | POA: Insufficient documentation

## 2017-08-11 DIAGNOSIS — D122 Benign neoplasm of ascending colon: Secondary | ICD-10-CM

## 2017-08-11 DIAGNOSIS — Z1211 Encounter for screening for malignant neoplasm of colon: Secondary | ICD-10-CM | POA: Insufficient documentation

## 2017-08-11 DIAGNOSIS — K219 Gastro-esophageal reflux disease without esophagitis: Secondary | ICD-10-CM | POA: Insufficient documentation

## 2017-08-11 DIAGNOSIS — Z7982 Long term (current) use of aspirin: Secondary | ICD-10-CM | POA: Insufficient documentation

## 2017-08-11 DIAGNOSIS — D128 Benign neoplasm of rectum: Secondary | ICD-10-CM | POA: Diagnosis not present

## 2017-08-11 DIAGNOSIS — M797 Fibromyalgia: Secondary | ICD-10-CM | POA: Diagnosis not present

## 2017-08-11 DIAGNOSIS — Z880 Allergy status to penicillin: Secondary | ICD-10-CM | POA: Insufficient documentation

## 2017-08-11 HISTORY — PX: COLONOSCOPY: SHX5424

## 2017-08-11 HISTORY — PX: POLYPECTOMY: SHX5525

## 2017-08-11 SURGERY — COLONOSCOPY
Anesthesia: Moderate Sedation

## 2017-08-11 MED ORDER — STERILE WATER FOR IRRIGATION IR SOLN
Status: DC | PRN
  Administered 2017-08-11: 08:00:00

## 2017-08-11 MED ORDER — MIDAZOLAM HCL 5 MG/5ML IJ SOLN
INTRAMUSCULAR | Status: DC | PRN
  Administered 2017-08-11 (×2): 2 mg via INTRAVENOUS

## 2017-08-11 MED ORDER — MIDAZOLAM HCL 5 MG/5ML IJ SOLN
INTRAMUSCULAR | Status: AC
  Filled 2017-08-11: qty 10

## 2017-08-11 MED ORDER — MEPERIDINE HCL 50 MG/ML IJ SOLN
INTRAMUSCULAR | Status: AC
  Filled 2017-08-11: qty 1

## 2017-08-11 MED ORDER — SODIUM CHLORIDE 0.9 % IV SOLN
INTRAVENOUS | Status: DC
Start: 2017-08-11 — End: 2017-08-11
  Administered 2017-08-11: 07:00:00 via INTRAVENOUS

## 2017-08-11 MED ORDER — MEPERIDINE HCL 50 MG/ML IJ SOLN
INTRAMUSCULAR | Status: DC | PRN
  Administered 2017-08-11 (×2): 25 mg via INTRAVENOUS

## 2017-08-11 NOTE — Discharge Instructions (Signed)
No aspirin or NSAIDs for 1 week. Resume other medications as before. High fiber diet. No driving for 24 hours. Physician will call with biopsy results.  Colonoscopy, Adult, Care After This sheet gives you information about how to care for yourself after your procedure. Your health care provider may also give you more specific instructions. If you have problems or questions, contact your health care provider. What can I expect after the procedure? After the procedure, it is common to have:  A small amount of blood in your stool for 24 hours after the procedure.  Some gas.  Mild abdominal cramping or bloating.  Follow these instructions at home: General instructions   For the first 24 hours after the procedure: ? Do not drive or use machinery. ? Do not sign important documents. ? Do not drink alcohol. ? Do your regular daily activities at a slower pace than normal. ? Eat soft, easy-to-digest foods. ? Rest often.  Take over-the-counter or prescription medicines only as told by your health care provider.  It is up to you to get the results of your procedure. Ask your health care provider, or the department performing the procedure, when your results will be ready. Relieving cramping and bloating  Try walking around when you have cramps or feel bloated.  Apply heat to your abdomen as told by your health care provider. Use a heat source that your health care provider recommends, such as a moist heat pack or a heating pad. ? Place a towel between your skin and the heat source. ? Leave the heat on for 20-30 minutes. ? Remove the heat if your skin turns bright red. This is especially important if you are unable to feel pain, heat, or cold. You may have a greater risk of getting burned. Eating and drinking  Drink enough fluid to keep your urine clear or pale yellow.  Resume your normal diet as instructed by your health care provider. Avoid heavy or fried foods that are hard to  digest.  Avoid drinking alcohol for as long as instructed by your health care provider. Contact a health care provider if:  You have blood in your stool 2-3 days after the procedure. Get help right away if:  You have more than a small spotting of blood in your stool.  You pass large blood clots in your stool.  Your abdomen is swollen.  You have nausea or vomiting.  You have a fever.  You have increasing abdominal pain that is not relieved with medicine. This information is not intended to replace advice given to you by your health care provider. Make sure you discuss any questions you have with your health care provider. Document Released: 05/25/2004 Document Revised: 07/05/2016 Document Reviewed: 12/23/2015 Elsevier Interactive Patient Education  [employer].

## 2017-08-11 NOTE — H&P (Signed)
Karen Delacruz is an 76 y.o. Delacruz.   Chief Complaint: patient is here for colonoscopy. HPI: patient is 76 year old Caucasian Delacruz who had cologuard for CRC and it was positive.last colonoscopy was several years ago. She denies abdominal pain change in bowel habits or rectal bleeding.  Family history is negative for CRC.  Past Medical History:  Diagnosis Date  . Abnormal EKG 05/2016   LAFB, voltage criteria for LVH in lead aVL.  Marland Kitchen Anxiety   . Cataract    right  . Colon cancer screening 06/2017   Cologuard positive--referral to GI  . Cystocele, unspecified (CODE) 2018   ? with vaginal prolapse.  Pessary fitted at Dr. Brynda Greathouse:  Milex ring with support #3--great improvement.  . Degenerative arthritis of cervical spine   . Fatty liver   . Fibromyalgia   . GERD (gastroesophageal reflux disease)   . History of hiatal hernia   . Hx: UTI (urinary tract infection)    recurrent.  Pyelo hospitalization 2012  . Hypertension   . Microhematuria 02/2012   Cystoscopy with bladder biopsy and cystouretograms--pathology benign.  . Myalgia    Hx of diffuse myalgias, onset in her 35s  . Osteoporosis 08/10/2016   T-score -2.5: started fosamax 07/2016.  Intol fosamax; started prolia 08/27/2016.  Marland Kitchen Periesophageal hiatal hernia 02/2016   Type 4  . Thickened endometrium 10/16/2011   on CT.  F/u pelvic u/s showed nabothian cyst to account for this..  No enometrial pathology noted on u/s, no ovaries visualized.  . Vertigo   . Vitamin B12 deficiency 11/2015   + LL PN    Past Surgical History:  Procedure Laterality Date  . BACK SURGERY  1990s x 2   Dr. Leeanne Deed in [place]  . CHOLECYSTECTOMY    . COLONOSCOPY  approx 2002   Diverticulosis, int hemorrhoids ([facility])  . ESOPHAGEAL MANOMETRY N/Karen 04/19/2016   Normal.  Procedure: ESOPHAGEAL MANOMETRY (EM);  Surgeon: Mauri Pole, MD;  Location: [facility];  Service: Endoscopy;  Laterality: N/Karen;  . EYE SURGERY Right    cataract extraction with IOL   . HYSTEROSCOPY  11/2004   Cervical hystogram   . INSERTION OF MESH N/Karen 06/18/2016   Procedure: INSERTION OF MESH;  Surgeon: Michael Boston, MD;  Location: [facility];  Service: General;  Laterality: N/Karen;  . NISSEN FUNDOPLICATION  81/85/6314    Family History  Problem Relation Age of Onset  . Cancer Mother        bladder  . Other Father        had bladder issues   Social History:  reports that she has never smoked. She has never used smokeless tobacco. She reports that she does not drink alcohol or use drugs.  Allergies:  Allergies  Allergen Reactions  . Ciprofloxacin Nausea And Vomiting  . Codeine Nausea And Vomiting and Other (See Comments)    Feels funny  . Celebrex [Celecoxib] Rash  . Penicillins Rash    Has patient had Karen PCN reaction causing immediate rash, facial/tongue/throat swelling, SOB or lightheadedness with hypotension: Rash Has patient had Karen PCN reaction causing severe rash involving mucus membranes or skin necrosis: No Has patient had Karen PCN reaction that required hospitalization No Has patient had Karen PCN reaction occurring within the last 10 years: NO If all of the above answers are "NO", then may proceed with Cephalosporin use.    Facility-Administered Medications Prior to Admission  Medication Dose Route Frequency Provider Last Rate Last Dose  . cyanocobalamin ((VITAMIN  B-12)) injection 1,000 mcg  1,000 mcg Intramuscular Q30 days Tammi Sou, MD   1,000 mcg at 07/29/17 8850   Medications Prior to Admission  Medication Sig Dispense Refill  . aspirin EC 81 MG tablet Take 81 mg by mouth. Every other day    . cyanocobalamin (,VITAMIN B-12,) 1000 MCG/ML injection Inject 1 mL (1,000 mcg total) into the muscle once. (Patient taking differently: Inject 1,000 mcg into the muscle every 30 (thirty) days. ) 1 mL 0  . DULoxetine (CYMBALTA) 60 MG capsule Take 1 capsule (60 mg total) by mouth daily. 30 capsule 3  . pantoprazole (PROTONIX) 40 MG tablet 1 tab po qd prn acid  reflux (Patient taking differently: Take 40 mg by mouth daily as needed. ) 30 tablet 3  . albuterol (VENTOLIN HFA) 108 (90 BASE) MCG/ACT inhaler Inhale 1-2 puffs into the lungs every 4 (four) hours as needed for wheezing or shortness of breath. 1 Inhaler 0  . promethazine (PHENERGAN) 25 MG tablet Take 25 mg by mouth every 6 (six) hours as needed for nausea or vomiting.      No results found for this or any previous visit (from the past 48 hour(s)). No results found.  ROS  Blood pressure (!) 141/83, pulse 70, temperature 98.5 F (36.9 C), temperature source Oral, resp. rate 20, SpO2 98 %. Physical Exam  Constitutional: She appears well-developed and well-nourished.  HENT:  Mouth/Throat: Oropharynx is clear and moist.  Eyes: Conjunctivae are normal. No scleral icterus.  Neck: No thyromegaly present.  Cardiovascular: Normal rate, regular rhythm and normal heart sounds.   No murmur heard. Respiratory: Effort normal and breath sounds normal.  GI: Soft. She exhibits no distension and no mass. There is no tenderness.  Musculoskeletal: She exhibits no edema.  Lymphadenopathy:    She has no cervical adenopathy.  Neurological: She is alert.  Skin: Skin is warm.     Assessment/Plan Positive Cologuard test. Diagnostic colonoscopy.  Hildred Laser, MD 08/11/2017, 7:32 AM

## 2017-08-11 NOTE — Op Note (Signed)
[facility] Patient Name: Karen Delacruz Procedure Date: 08/11/2017 7:12 AM MRN: [id number] Date of Birth: 28-Apr-1941 Attending MD: Hildred Laser , MD CSN: [id number] Age: 76 Admit Type: Outpatient Procedure:                Colonoscopy Indications:              Positive Cologuard test Providers:                Hildred Laser, MD, Jeanann Lewandowsky. Sharon Seller, RN, Randa Spike, Technician Referring MD:             Tammi Sou, MD Medicines:                Meperidine 50 mg IV, Midazolam 4 mg IV Complications:            No immediate complications. Estimated Blood Loss:     Estimated blood loss was minimal. Procedure:                Pre-Anesthesia Assessment:                           - Prior to the procedure, a History and Physical                            was performed, and patient medications and                            allergies were reviewed. The patient's tolerance of                            previous anesthesia was also reviewed. The risks                            and benefits of the procedure and the sedation                            options and risks were discussed with the patient.                            All questions were answered, and informed consent                            was obtained. Prior Anticoagulants: The patient                            last took aspirin 6 days prior to the procedure.                            ASA Grade Assessment: II - A patient with mild                            systemic disease. After reviewing the risks and  benefits, the patient was deemed in satisfactory                            condition to undergo the procedure.                           After obtaining informed consent, the colonoscope                            was passed under direct vision. Throughout the                            procedure, the patient's blood pressure, pulse, and   oxygen saturations were monitored continuously. The                            EC-[id number] ([id number]) scope was introduced through                            the anus and advanced to the the cecum, identified                            by appendiceal orifice and ileocecal valve. The                            colonoscopy was performed without difficulty. The                            patient tolerated the procedure well. The quality                            of the bowel preparation was good. The ileocecal                            valve, appendiceal orifice, and rectum were                            photographed. Scope In: 7:40:58 AM Scope Out: 8:11:02 AM Scope Withdrawal Time: 0 hours 19 minutes 6 seconds  Total Procedure Duration: 0 hours 30 minutes 4 seconds  Findings:      The perianal and digital rectal examinations were normal.      Two sessile polyps were found in the hepatic flexure and ascending       colon. The polyps were small in size. These were biopsied with a cold       forceps for histology. The pathology specimen was placed into Bottle       Number 1.      A 10 mm polyp was found in the rectum. The polyp was semi-sessile. The       polyp was removed with a hot snare. Resection and retrieval were       complete. The pathology specimen was placed into Bottle Number 2.      A few medium-mouthed diverticula were found in the sigmoid colon.      External hemorrhoids were found during retroflexion. The hemorrhoids  were small. Impression:               - Two small polyps at the hepatic flexure and in                            the ascending colon. Biopsied.                           - One 10 mm polyp in the rectum, removed with a hot                            snare. Resected and retrieved.                           - Diverticulosis in the sigmoid colon.                           - External hemorrhoids. Moderate Sedation:      Moderate (conscious) sedation was  administered by the endoscopy nurse       and supervised by the endoscopist. The following parameters were       monitored: oxygen saturation, heart rate, blood pressure, CO2       capnography and response to care. Total physician intraservice time was       35 minutes. Recommendation:           - Patient has a contact number available for                            emergencies. The signs and symptoms of potential                            delayed complications were discussed with the                            patient. Return to normal activities tomorrow.                            Written discharge instructions were provided to the                            patient.                           - High fiber diet today.                           - Resume aspirin at prior dose in 1 week.                           - Await pathology results.                           - Repeat colonoscopy date to be determined after                            pending pathology results are  reviewed. Procedure Code(s):        --- Professional ---                           [id number], Colonoscopy, flexible; with removal of                            tumor(s), polyp(s), or other lesion(s) by snare                            technique                           45380, 59, Colonoscopy, flexible; with biopsy,                            single or multiple                           99152, Moderate sedation services provided by the                            same physician or other qualified health care                            professional performing the diagnostic or                            therapeutic service that the sedation supports,                            requiring the presence of an independent trained                            observer to assist in the monitoring of the                            patient's level of consciousness and physiological                            status; initial 15 minutes of  intraservice time,                            patient age 45 years or older                           [phone], Moderate sedation services; each additional                            15 minutes intraservice time Diagnosis Code(s):        --- Professional ---                           D12.3, Benign neoplasm of transverse colon (hepatic  flexure or splenic flexure)                           D12.2, Benign neoplasm of ascending colon                           K62.1, Rectal polyp                           K64.4, Residual hemorrhoidal skin tags                           R19.5, Other fecal abnormalities                           K57.30, Diverticulosis of large intestine without                            perforation or abscess without bleeding CPT copyright 2016 American Medical Association. All rights reserved. The codes documented in this report are preliminary and upon coder review may  be revised to meet current compliance requirements. Hildred Laser, MD Hildred Laser, MD 08/11/2017 8:21:47 AM This report has been signed electronically. Number of Addenda: 0

## 2017-08-15 ENCOUNTER — Encounter (HOSPITAL_COMMUNITY): Payer: Self-pay | Admitting: Internal Medicine

## 2017-08-16 ENCOUNTER — Encounter: Payer: Self-pay | Admitting: Family Medicine

## 2017-08-18 ENCOUNTER — Encounter: Payer: Self-pay | Admitting: Family Medicine

## 2017-08-29 ENCOUNTER — Ambulatory Visit: Payer: Medicare Other

## 2017-08-29 ENCOUNTER — Ambulatory Visit: Payer: Medicare Other | Admitting: Family Medicine

## 2017-08-29 ENCOUNTER — Encounter: Payer: Self-pay | Admitting: Family Medicine

## 2017-08-29 VITALS — BP 137/85 | HR 66 | Temp 97.9°F | Resp 16 | Ht 68.0 in | Wt 148.5 lb

## 2017-08-29 DIAGNOSIS — R109 Unspecified abdominal pain: Secondary | ICD-10-CM

## 2017-08-29 DIAGNOSIS — M810 Age-related osteoporosis without current pathological fracture: Secondary | ICD-10-CM

## 2017-08-29 DIAGNOSIS — R1032 Left lower quadrant pain: Secondary | ICD-10-CM | POA: Diagnosis not present

## 2017-08-29 DIAGNOSIS — Z9889 Other specified postprocedural states: Secondary | ICD-10-CM

## 2017-08-29 DIAGNOSIS — E538 Deficiency of other specified B group vitamins: Secondary | ICD-10-CM

## 2017-08-29 DIAGNOSIS — R1012 Left upper quadrant pain: Secondary | ICD-10-CM | POA: Diagnosis not present

## 2017-08-29 LAB — CBC WITH DIFFERENTIAL/PLATELET
BASOS PCT: 0.4 % (ref 0.0–3.0)
Basophils Absolute: 0 10*3/uL (ref 0.0–0.1)
EOS ABS: 0.2 10*3/uL (ref 0.0–0.7)
EOS PCT: 3.7 % (ref 0.0–5.0)
HEMATOCRIT: 41 % (ref 36.0–46.0)
HEMOGLOBIN: 13.7 g/dL (ref 12.0–15.0)
LYMPHS PCT: 42.6 % (ref 12.0–46.0)
Lymphs Abs: 2.6 10*3/uL (ref 0.7–4.0)
MCHC: 33.5 g/dL (ref 30.0–36.0)
MCV: 94.6 fl (ref 78.0–100.0)
MONO ABS: 0.6 10*3/uL (ref 0.1–1.0)
Monocytes Relative: 9.8 % (ref 3.0–12.0)
Neutro Abs: 2.7 10*3/uL (ref 1.4–7.7)
Neutrophils Relative %: 43.5 % (ref 43.0–77.0)
Platelets: 245 10*3/uL (ref 150.0–400.0)
RBC: 4.33 Mil/uL (ref 3.87–5.11)
RDW: 13.7 % (ref 11.5–15.5)
WBC: 6.2 10*3/uL (ref 4.0–10.5)

## 2017-08-29 LAB — COMPREHENSIVE METABOLIC PANEL
ALBUMIN: 4.1 g/dL (ref 3.5–5.2)
ALT: 8 U/L (ref 0–35)
AST: 15 U/L (ref 0–37)
Alkaline Phosphatase: 60 U/L (ref 39–117)
BUN: 11 mg/dL (ref 6–23)
CHLORIDE: 100 meq/L (ref 96–112)
CO2: 31 meq/L (ref 19–32)
CREATININE: 0.62 mg/dL (ref 0.40–1.20)
Calcium: 9.8 mg/dL (ref 8.4–10.5)
GFR: 99.57 mL/min (ref >60.00)
Glucose, Bld: 88 mg/dL (ref 70–99)
Potassium: 4.1 mEq/L (ref 3.5–5.1)
SODIUM: 138 meq/L (ref 135–145)
Total Bilirubin: 0.9 mg/dL (ref 0.2–1.2)
Total Protein: 6.9 g/dL (ref 6.0–8.3)

## 2017-08-29 LAB — LIPASE: Lipase: 21 U/L (ref 11.0–59.0)

## 2017-08-29 MED ORDER — DENOSUMAB 60 MG/ML ~~LOC~~ SOLN
60.0000 mg | Freq: Once | SUBCUTANEOUS | Status: AC
  Administered 2017-08-29: 60 mg via SUBCUTANEOUS

## 2017-08-29 MED ORDER — CYANOCOBALAMIN 1000 MCG/ML IJ SOLN
1000.0000 ug | Freq: Once | INTRAMUSCULAR | Status: AC
  Administered 2017-08-29: 1000 ug via INTRAMUSCULAR

## 2017-08-29 NOTE — Progress Notes (Signed)
OFFICE VISIT  08/29/2017   CC:  Chief Complaint  Patient presents with  . Hernia    ? LLQ pain    HPI:    Patient is a 76 y.o. Caucasian female who presents for GI symptoms. Onset about 1 mo ago, L mid/lower abd pain, into back on L sometimes, constant pressure/soreness in the area and occ sharp jabs.  No nausea.  Appetite present.   Only occasional dysphagia--usually meats.  Sometimes eating makes the pain worse and sometimes it does not. Only occ heartburn.  Says her amount of "gas"--per rectum has increased in the last month. No unexplained fevers.  No change in bowel habits, no melena or hematochezia.  No dysuria, hematuria, urgency, or frequency.  Past Medical History:  Diagnosis Date  . Abnormal EKG 05/2016   LAFB, voltage criteria for LVH in lead aVL.  Marland Kitchen Anxiety   . Cataract    right  . Colon cancer screening 06/2017   Cologuard positive--colonoscopy showed adenomatous polyp with high grade dysplasia--recall 3 yrs.  . Cystocele, unspecified (CODE) 2018   ? with vaginal prolapse.  Pessary fitted at Dr. Brynda Greathouse:  Milex ring with support #3--great improvement.  . Degenerative arthritis of cervical spine   . Fatty liver   . Fibromyalgia   . GERD (gastroesophageal reflux disease)   . History of hiatal hernia   . Hx: UTI (urinary tract infection)    recurrent.  Pyelo hospitalization 2012  . Hypertension   . Microhematuria 02/2012   Cystoscopy with bladder biopsy and cystouretograms--pathology benign.  . Myalgia    Hx of diffuse myalgias, onset in her 61s  . Osteoporosis 08/10/2016   T-score -2.5: started fosamax 07/2016.  Intol fosamax; started prolia 08/27/2016.  Marland Kitchen Periesophageal hiatal hernia 02/2016   Type 4  . Thickened endometrium 10/16/2011   on CT.  F/u pelvic u/s showed nabothian cyst to account for this..  No enometrial pathology noted on u/s, no ovaries visualized.  . Vertigo   . Vitamin B12 deficiency 11/2015   + LL PN    Past Surgical History:  Procedure  Laterality Date  . BACK SURGERY  1990s x 2   Dr. Leeanne Deed in [place]  . CHOLECYSTECTOMY    . COLONOSCOPY  approx 2002; 08/11/17   2002; Diverticulosis, int hemorrhoids ([facility]).  2018 (for + cologuard)--adenomatous polyps with high grade dysplasia (recall 3 yrs), also diverticulosis.  Marland Kitchen EYE SURGERY Right    cataract extraction with IOL  . HYSTEROSCOPY  11/2004   Cervical hystogram   . NISSEN FUNDOPLICATION  67/34/1937    Outpatient Medications Prior to Visit  Medication Sig Dispense Refill  . albuterol (VENTOLIN HFA) 108 (90 BASE) MCG/ACT inhaler Inhale 1-2 puffs into the lungs every 4 (four) hours as needed for wheezing or shortness of breath. 1 Inhaler 0  . aspirin EC 81 MG tablet Take 1 tablet (81 mg total) by mouth. Every other day    . cyanocobalamin (,VITAMIN B-12,) 1000 MCG/ML injection Inject 1 mL (1,000 mcg total) into the muscle once. (Patient taking differently: Inject 1,000 mcg into the muscle every 30 (thirty) days. ) 1 mL 0  . DULoxetine (CYMBALTA) 60 MG capsule Take 1 capsule (60 mg total) by mouth daily. 30 capsule 3  . pantoprazole (PROTONIX) 40 MG tablet 1 tab po qd prn acid reflux (Patient taking differently: Take 40 mg by mouth daily as needed. ) 30 tablet 3  . promethazine (PHENERGAN) 25 MG tablet Take 25 mg by mouth every 6 (  six) hours as needed for nausea or vomiting.     No facility-administered medications prior to visit.     Allergies  Allergen Reactions  . Ciprofloxacin Nausea And Vomiting  . Codeine Nausea And Vomiting and Other (See Comments)    Feels funny  . Celebrex [Celecoxib] Rash  . Penicillins Rash    Has patient had a PCN reaction causing immediate rash, facial/tongue/throat swelling, SOB or lightheadedness with hypotension: Rash Has patient had a PCN reaction causing severe rash involving mucus membranes or skin necrosis: No Has patient had a PCN reaction that required hospitalization No Has patient had a PCN reaction occurring within the last  10 years: NO If all of the above answers are "NO", then may proceed with Cephalosporin use.    ROS As per HPI  PE: Blood pressure 137/85, pulse 66, temperature 97.9 F (36.6 C), temperature source Oral, resp. rate 16, height 5\' 8"  (1.727 m), weight 148 lb 8 oz (67.4 kg), SpO2 97 %.  Exam chaperoned by Starla Link, CMA. MVH:QION: no injection, icteris, swelling, or exudate.  EOMI, PERRLA. Mouth: lips without lesion/swelling.  Oral mucosa pink and moist. Oropharynx without erythema, exudate, or swelling.  Gen: Alert, well appearing.  Patient is oriented to person, place, time, and situation. AFFECT: pleasant, lucid thought and speech. CV: RRR, no m/r/g.   LUNGS: CTA bilat, nonlabored resps, good aeration in all lung fields. ABD: soft, nondistended, BS normal, no bruit or mass or HSM.  She has mild/mod TTP in entire upper abd, but more TTP in LLQ and L mid abd area.  The tenderness extends around her L side into L LB region. No guarding or rebound.  When I palpate LLQ and have her do an abd crunch, she notes the pain gets worse. SKIN: no jaundice.  LABS:    Chemistry      Component Value Date/Time   NA 142 03/15/2017 0958   K 3.6 03/15/2017 0958   CL 103 03/15/2017 0958   CO2 32 03/15/2017 0958   BUN 8 03/15/2017 0958   CREATININE 0.55 03/15/2017 0958      Component Value Date/Time   CALCIUM 8.8 03/15/2017 0958   ALKPHOS 65 03/15/2017 0958   AST 16 03/15/2017 0958   ALT 9 03/15/2017 0958   BILITOT 0.7 03/15/2017 0958     Lab Results  Component Value Date   LIPASE 25.0 03/15/2017    IMPRESSION AND PLAN:  1) Diffuse abd pains, with focus on L side/LLQ, with extension around L side and into L back. This is similar to her presenting sx's when she had her significant hiatal hernia that had to be surgically repaired + Nissen fundoplication done about 15 mo ago. Recent colonoscopy was reassuring.  Low suspicion of acute diverticulitis given the time course of this. Minimal/only  occ dysphagia.   Upper GI barium swallow ordered to make sure no sign of recurrent hiatal hernia or problem with her fundoplication.   CBC w/diff, CMET, and lipase.  Other DDX: abdominal wall pain, pelvic/ovarian pathology, pancreatic mass (much less likely). Her weight is stable.  If current w/u unremarkable, likely will obtain CT abd/pelv.  An After Visit Summary was printed and given to the patient.  FOLLOW UP: Return for f/u abd pain.  Signed:  Crissie Sickles, MD           08/29/2017

## 2017-09-02 ENCOUNTER — Ambulatory Visit (HOSPITAL_COMMUNITY)
Admission: RE | Admit: 2017-09-02 | Discharge: 2017-09-02 | Disposition: A | Discharge status: Home / Self Care | Payer: Medicare Other | Source: Ambulatory Visit | Attending: Family Medicine | Admitting: Family Medicine

## 2017-09-02 DIAGNOSIS — Z9889 Other specified postprocedural states: Secondary | ICD-10-CM | POA: Diagnosis not present

## 2017-09-02 DIAGNOSIS — R1012 Left upper quadrant pain: Secondary | ICD-10-CM | POA: Insufficient documentation

## 2017-09-02 DIAGNOSIS — R109 Unspecified abdominal pain: Secondary | ICD-10-CM | POA: Insufficient documentation

## 2017-09-02 DIAGNOSIS — R1032 Left lower quadrant pain: Secondary | ICD-10-CM | POA: Diagnosis not present

## 2017-09-27 ENCOUNTER — Ambulatory Visit (INDEPENDENT_AMBULATORY_CARE_PROVIDER_SITE_OTHER): Payer: Medicare Other

## 2017-09-27 DIAGNOSIS — E538 Deficiency of other specified B group vitamins: Secondary | ICD-10-CM

## 2017-09-27 MED ORDER — CYANOCOBALAMIN 1000 MCG/ML IJ SOLN
1000.0000 ug | Freq: Once | INTRAMUSCULAR | Status: AC
  Administered 2017-09-27: 1000 ug via INTRAMUSCULAR

## 2017-09-27 NOTE — Progress Notes (Signed)
Patient presented today for Vitamin  B12 injection. Given with no incidence or problems. Patient left with no conplaints.

## 2017-10-10 ENCOUNTER — Other Ambulatory Visit: Payer: Self-pay

## 2017-10-10 ENCOUNTER — Encounter: Payer: Self-pay | Admitting: Obstetrics & Gynecology

## 2017-10-10 ENCOUNTER — Ambulatory Visit (INDEPENDENT_AMBULATORY_CARE_PROVIDER_SITE_OTHER): Payer: Medicare Other | Admitting: Obstetrics & Gynecology

## 2017-10-10 VITALS — BP 134/78 | HR 75 | Ht 68.5 in | Wt 147.0 lb

## 2017-10-10 DIAGNOSIS — N8110 Cystocele, unspecified: Secondary | ICD-10-CM

## 2017-10-10 DIAGNOSIS — Z4689 Encounter for fitting and adjustment of other specified devices: Secondary | ICD-10-CM | POA: Diagnosis not present

## 2017-10-10 MED ORDER — METRONIDAZOLE 0.75 % VA GEL: VAGINAL | 11 refills | Status: DC

## 2017-10-10 NOTE — Progress Notes (Signed)
  Karen Delacruz is in today for follow-up related to her pessary  She uses a Milex ring with support #2 and that has been in now for a total of 4 months  Her only complaint today is increased amount of vaginal discharge which is bothersome to her she denies any bleeding   On exam today she has normal external genitalia the pessary is seated in place properly The pessary is removed and cleaned with warm soap and water  The pessary is replaced into the vagina without difficulty There is no undue vaginal mucosal pressure or breakdown noted I would describe it as a minimal to moderate amount of vaginal discharge  I am going to send in a prescription for [name] for MetroGel cream to use every 2 or 3 nights for 5 applications and she can use a half applicator as needed maybe once a week in order to control the disruption in the vaginal microflora  Otherwise I will see her back in 3 months for ongoing pessary maintenance  By the way she brought in an orange slice cake today and it is very good and moist

## 2017-10-28 ENCOUNTER — Ambulatory Visit (INDEPENDENT_AMBULATORY_CARE_PROVIDER_SITE_OTHER): Payer: Medicare Other | Admitting: Family Medicine

## 2017-10-28 ENCOUNTER — Encounter: Payer: Self-pay | Admitting: Family Medicine

## 2017-10-28 VITALS — BP 140/82 | HR 70 | Temp 97.9°F | Resp 16 | Ht 68.5 in | Wt 145.5 lb

## 2017-10-28 DIAGNOSIS — E538 Deficiency of other specified B group vitamins: Secondary | ICD-10-CM | POA: Diagnosis not present

## 2017-10-28 DIAGNOSIS — I10 Essential (primary) hypertension: Secondary | ICD-10-CM

## 2017-10-28 DIAGNOSIS — Z0000 Encounter for general adult medical examination without abnormal findings: Secondary | ICD-10-CM

## 2017-10-28 LAB — LIPID PANEL
CHOLESTEROL: 175 mg/dL (ref 0–200)
HDL: 60.6 mg/dL (ref >39.00)
LDL Cholesterol: 98 mg/dL (ref 0–99)
NonHDL: 113.9
Total CHOL/HDL Ratio: 3
Triglycerides: 81 mg/dL (ref 0.0–149.0)
VLDL: 16.2 mg/dL (ref 0.0–40.0)

## 2017-10-28 MED ORDER — CYANOCOBALAMIN 1000 MCG/ML IJ SOLN: 1000.0000 ug | Freq: Once | INTRAMUSCULAR | Status: DC

## 2017-10-28 MED ORDER — CYANOCOBALAMIN 1000 MCG/ML IJ SOLN
1000.0000 ug | Freq: Once | INTRAMUSCULAR | Status: AC
  Administered 2017-10-28: 1000 ug via INTRAMUSCULAR

## 2017-10-28 MED ORDER — ZOSTER VAC RECOMB ADJUVANTED 50 MCG/0.5ML IM SUSR: 0.5000 mL | Freq: Once | INTRAMUSCULAR | 1 refills | Status: AC

## 2017-10-28 NOTE — Patient Instructions (Signed)

## 2017-10-28 NOTE — Addendum Note (Signed)
Addended by: Gordy Councilman on: 10/28/2017 09:37 AM   Modules accepted: Orders

## 2017-10-28 NOTE — Progress Notes (Addendum)
Office Note 10/28/2017  CC:  Chief Complaint  Patient presents with  . Annual Exam    Pt is fasting.     HPI:  Karen Delacruz is a 77 y.o. White female who is here for annual health maintenance exam.  Exercise: walking daily. Diet: trying to eat healthy. Eyes: exam 1 yr ago. Dental: dentures.  She does not monitor her bp any at home.   Past Medical History:  Diagnosis Date  . Abnormal EKG 05/2016   LAFB, voltage criteria for LVH in lead aVL.  Marland Kitchen Anxiety   . Cataract    right  . Colon cancer screening 06/2017   Cologuard positive--colonoscopy showed adenomatous polyp with high grade dysplasia--recall 3 yrs.  . Cystocele, unspecified (CODE) 2018   ? with vaginal prolapse.  Pessary fitted at Dr. Brynda [facility]ath[facility]:  Milex ring with support #3--great improvement.  . Degenerative arthritis of cervical spine   . Fatty liver   . Fibromyalgia   . GERD (gastroesophageal reflux disease)   . History of hiatal hernia   . Hx: UTI (urinary tract infection)    recurrent.  Pyelo hospitalization 2012  . Hypertension   . Microhematuria 02/2012   Cystoscopy with bladder biopsy and cystouretograms--pathology benign.  . Myalgia    Hx of diffuse myalgias, onset in her 31s  . Osteoporosis 08/10/2016   T-score -2.5: started fosamax 07/2016.  Intol fosamax; started prolia 08/27/2016.  Marland Kitchen Periesophageal hiatal hernia 02/2016   Type 4  . Thickened endometrium 10/16/2011   on CT.  F/u pelvic u/s showed nabothian cyst to account for this..  No enometrial pathology noted on u/s, no ovaries visualized.  . Vertigo   . Vitamin B12 deficiency 11/2015   + LL PN    Past Surgical History:  Procedure Laterality Date  . BACK SURGERY  1990s x 2   Dr. Leeanne Deed in [place]  . CHOLECYSTECTOMY    . COLONOSCOPY  approx 2002; 08/11/17   2002; Diverticulosis, int hemorrhoids ([facility]).  2018 (for + cologuard)--adenomatous polyps with high grade dysplasia (recall 3 yrs), also diverticulosis.  . COLONOSCOPY N/A  08/11/2017   Procedure: COLONOSCOPY;  Surgeon: Rogene Houston, MD;  [facility];  Service: Endoscopy;  Laterality: N/A;  7:30  . ESOPHAGEAL MANOMETRY N/A 04/19/2016   Normal.  Procedure: ESOPHAGEAL MANOMETRY (EM);  Surgeon: Mauri Pole, MD;  Location: [facility];  Service: Endoscopy;  Laterality: N/A;  . EYE SURGERY Right    cataract extraction with IOL  . HYSTEROSCOPY  11/2004   Cervical hystogram   . INSERTION OF MESH N/A 06/18/2016   Procedure: INSERTION OF MESH;  Surgeon: Michael Boston, MD;  Location: [facility];  Service: General;  Laterality: N/A;  . NISSEN FUNDOPLICATION  13/24/4010  . POLYPECTOMY  08/11/2017   Procedure: POLYPECTOMY;  Surgeon: Rogene Houston, MD;  [facility]: [facility];  Service: Endoscopy;;  colon     Family History  Problem Relation Age of Onset  . Cancer Mother        bladder  . Other Father        had bladder issues    Social History   Socioeconomic History  . Marital status: Married    Spouse name: Not on file  . Number of children: Not on file  . Years of education: Not on file  . Highest education level: Not on file  Social Needs  . Financial resource strain: Not on file  . Food insecurity - worry: Not on  file  . Food insecurity - inability: Not on file  . Transportation needs - medical: Not on file  . Transportation needs - non-medical: Not on file  Occupational History  . Not on file  Tobacco Use  . Smoking status: Never Smoker  . Smokeless tobacco: Never Used  Substance and Sexual Activity  . Alcohol use: No  . Drug use: No  . Sexual activity: Yes    Birth control/protection: Post-menopausal  Other Topics Concern  . Not on file  Social History Narrative   Married, lives with husband in [place].  Has two grown children.   Retired from [employer], then un-retired to go back to same job--full time.   No t/a/d.   No formal exercise.   Normal american diet.    Outpatient Medications Prior to Visit   Medication Sig Dispense Refill  . albuterol (VENTOLIN HFA) 108 (90 BASE) MCG/ACT inhaler Inhale 1-2 puffs into the lungs every 4 (four) hours as needed for wheezing or shortness of breath. 1 Inhaler 0  . aspirin EC 81 MG tablet Take 1 tablet (81 mg total) by mouth. Every other day    . cyanocobalamin (,VITAMIN B-12,) 1000 MCG/ML injection Inject 1 mL (1,000 mcg total) into the muscle once. (Patient taking differently: Inject 1,000 mcg into the muscle every 30 (thirty) days. ) 1 mL 0  . DULoxetine (CYMBALTA) 60 MG capsule Take 1 capsule (60 mg total) by mouth daily. 30 capsule 3  . metroNIDAZOLE (METROGEL VAGINAL) 0.75 % vaginal gel Nightly x 5 nights 70 g 11  . pantoprazole (PROTONIX) 40 MG tablet 1 tab po qd prn acid reflux (Patient taking differently: Take 40 mg by mouth daily as needed. ) 30 tablet 3  . promethazine (PHENERGAN) 25 MG tablet Take 25 mg by mouth every 6 (six) hours as needed for nausea or vomiting.     No facility-administered medications prior to visit.     Allergies  Allergen Reactions  . Ciprofloxacin Nausea And Vomiting  . Codeine Nausea And Vomiting and Other (See Comments)    Feels funny  . Celebrex [Celecoxib] Rash  . Penicillins Rash    Has patient had a PCN reaction causing immediate rash, facial/tongue/throat swelling, SOB or lightheadedness with hypotension: Rash Has patient had a PCN reaction causing severe rash involving mucus membranes or skin necrosis: No Has patient had a PCN reaction that required hospitalization No Has patient had a PCN reaction occurring within the last 10 years: NO If all of the above answers are "NO", then may proceed with Cephalosporin use.    ROS Review of Systems  Constitutional: Negative for appetite change, chills, fatigue and fever.  HENT: Negative for congestion, dental problem, ear pain and sore throat.   Eyes: Negative for discharge, redness and visual disturbance.  Respiratory: Negative for cough, chest tightness,  shortness of breath and wheezing.   Cardiovascular: Negative for chest pain, palpitations and leg swelling.  Gastrointestinal: Negative for abdominal pain, blood in stool, diarrhea, nausea and vomiting.  Genitourinary: Negative for difficulty urinating, dysuria, flank pain, frequency, hematuria and urgency.  Musculoskeletal: Negative for arthralgias, back pain, joint swelling, myalgias and neck stiffness.  Skin: Negative for pallor and rash.  Neurological: Negative for dizziness, speech difficulty, weakness and headaches.  Hematological: Negative for adenopathy. Does not bruise/bleed easily.  Psychiatric/Behavioral: Negative for confusion and sleep disturbance. The patient is not nervous/anxious.     PE; BP recheck today was 140/82 Blood pressure (!) 145/73, pulse 70, temperature 97.9 F (36.6  C), temperature source Oral, resp. rate 16, height 5' 8.5" (1.74 m), weight 145 lb 8 oz (66 kg), SpO2 97 %. Exam chaperoned by Starla Link, CMA. Gen: Alert, well appearing.  Patient is oriented to person, place, time, and situation. AFFECT: pleasant, lucid thought and speech. ENT: Ears: EACs clear, normal epithelium.  TMs with good light reflex and landmarks bilaterally.  Eyes: no injection, icteris, swelling, or exudate.  EOMI, PERRLA. Nose: no drainage or turbinate edema/swelling.  No injection or focal lesion.  Mouth: lips without lesion/swelling.  Oral mucosa pink and moist.  Dentition intact and without obvious caries or gingival swelling.  Oropharynx without erythema, exudate, or swelling.  Neck: supple/nontender.  No LAD, mass, or TM.  Carotid pulses 2+ bilaterally, without bruits. CV: RRR, no m/r/g.   LUNGS: CTA bilat, nonlabored resps, good aeration in all lung fields. ABD: soft, NT, ND, BS normal.  No hepatospenomegaly or mass.  No bruits. EXT: no clubbing, cyanosis, or edema.  Musculoskeletal: no joint swelling, erythema, warmth, or tenderness.  ROM of all joints intact. Skin - no sores or  suspicious lesions or rashes or color changes  Pertinent labs:  Lab Results  Component Value Date   TSH 2.17 03/15/2017   Lab Results  Component Value Date   WBC 6.2 08/29/2017   HGB 13.7 08/29/2017   HCT 41.0 08/29/2017   MCV 94.6 08/29/2017   PLT 245.0 08/29/2017   Lab Results  Component Value Date   CREATININE 0.62 08/29/2017   BUN 11 08/29/2017   NA 138 08/29/2017   K 4.1 08/29/2017   CL 100 08/29/2017   CO2 31 08/29/2017   Lab Results  Component Value Date   ALT 8 08/29/2017   AST 15 08/29/2017   ALKPHOS 60 08/29/2017   BILITOT 0.9 08/29/2017   Lab Results  Component Value Date   CHOL 179 08/12/2014   Lab Results  Component Value Date   HDL 40.00 08/12/2014   Lab Results  Component Value Date   LDLCALC 107 (H) 08/12/2014   Lab Results  Component Value Date   TRIG 158.0 (H) 08/12/2014   Lab Results  Component Value Date   CHOLHDL 4 08/12/2014   Lab Results  Component Value Date   HGBA1C 5.7 12/19/2015   Lab Results  Component Value Date   VITAMINB12 460 02/01/2017   ASSESSMENT AND PLAN:   Health maintenance exam: Reviewed age and gender appropriate health maintenance issues (prudent diet, regular exercise, health risks of tobacco and excessive alcohol, use of seatbelts, fire alarms in home, use of sunscreen).  Also reviewed age and gender appropriate health screening as well as vaccine recommendations. Vaccines: all UTD.  Shingrix discussed--rx printed. Labs: UTD except lipid screening (has HTN and hx of mild hyperlip). Cervical ca screening: pap 06/09/17 [facility] (Dr. Elonda Husky). Breast ca screening: mammogram normal 02/14/17--repeat 1 yr. Colon ca screening: hx of adenomatous polyps--next colonoscopy due 06/2020 (Dr. Laural Golden).  An After Visit Summary was printed and given to the patient.  FOLLOW UP:  Return in about 6 months (around 04/27/2018) for routine chronic illness f/u.  Signed:  Crissie Sickles, MD           10/28/2017

## 2017-10-31 ENCOUNTER — Encounter: Payer: Self-pay | Admitting: *Deleted

## 2017-11-25 ENCOUNTER — Ambulatory Visit (INDEPENDENT_AMBULATORY_CARE_PROVIDER_SITE_OTHER): Payer: Medicare Other

## 2017-11-25 DIAGNOSIS — E538 Deficiency of other specified B group vitamins: Secondary | ICD-10-CM

## 2017-11-25 MED ORDER — CYANOCOBALAMIN 1000 MCG/ML IJ SOLN
1000.0000 ug | Freq: Once | INTRAMUSCULAR | Status: AC
  Administered 2017-11-25: 1000 ug via INTRAMUSCULAR

## 2017-11-25 NOTE — Progress Notes (Signed)
Patient presents today for Vit[place] B12 injection, given with no incidence or problems. Patient left with no complaints.

## 2017-11-28 NOTE — Progress Notes (Signed)
Pt with vit B12 def. Agree with vit B12 1000 mcg IM in office today.  Signed:  Crissie Sickles, MD           11/28/2017

## 2017-12-23 ENCOUNTER — Ambulatory Visit (INDEPENDENT_AMBULATORY_CARE_PROVIDER_SITE_OTHER): Payer: Medicare Other | Admitting: *Deleted

## 2017-12-23 DIAGNOSIS — E538 Deficiency of other specified B group vitamins: Secondary | ICD-10-CM | POA: Diagnosis not present

## 2017-12-23 MED ORDER — CYANOCOBALAMIN 1000 MCG/ML IJ SOLN
1000.0000 ug | Freq: Once | INTRAMUSCULAR | Status: AC
  Administered 2017-12-23: 1000 ug via INTRAMUSCULAR

## 2017-12-23 NOTE — Progress Notes (Addendum)
Pt came into office today for monthly b12 injection. Last injection was given 11/25/17. Pt tolerated injection well. Pt will return in one month for her next injection.  Medical screening examination/treatment/procedure(s) were performed by non-physician practitioner and as supervising physician I was immediately available for consultation/collaboration.  I agree with above assessment and plan.  Electronically Signed by: Howard Pouch, DO [facility]

## 2018-01-09 ENCOUNTER — Ambulatory Visit: Payer: Medicare Other | Admitting: Obstetrics & Gynecology

## 2018-01-10 ENCOUNTER — Ambulatory Visit: Payer: Medicare Other | Admitting: Obstetrics & Gynecology

## 2018-01-10 ENCOUNTER — Encounter: Payer: Self-pay | Admitting: Obstetrics & Gynecology

## 2018-01-10 VITALS — BP 132/70 | HR 76 | Ht 68.5 in | Wt 151.0 lb

## 2018-01-10 DIAGNOSIS — Z4689 Encounter for fitting and adjustment of other specified devices: Secondary | ICD-10-CM

## 2018-01-10 DIAGNOSIS — N8110 Cystocele, unspecified: Secondary | ICD-10-CM | POA: Diagnosis not present

## 2018-01-10 NOTE — Progress Notes (Signed)
Chief Complaint  Patient presents with  . Pessary Check    Blood pressure 132/70, pulse 76, height 5' 8.5" (1.74 m), weight 151 lb (68.5 kg).  Karen Delacruz presents today for routine follow up related to her pessary.   She uses a Milex ring with support #2 She reports a  little vaginal discharge and no  vaginal bleeding.  Exam reveals no undue vaginal mucosal pressure of breakdown, little discharge and no vaginal bleeding.  The pessary is removed, cleaned and replaced without difficulty.    Karen Delacruz will be sen back in 4 months for continued follow up.  Florian Buff, MD  01/10/2018 10:48 AM

## 2018-01-23 ENCOUNTER — Ambulatory Visit (INDEPENDENT_AMBULATORY_CARE_PROVIDER_SITE_OTHER): Payer: Medicare Other | Admitting: *Deleted

## 2018-01-23 DIAGNOSIS — E538 Deficiency of other specified B group vitamins: Secondary | ICD-10-CM | POA: Diagnosis not present

## 2018-01-23 MED ORDER — CYANOCOBALAMIN 1000 MCG/ML IJ SOLN
1000.0000 ug | Freq: Once | INTRAMUSCULAR | Status: AC
  Administered 2018-01-23: 1000 ug via INTRAMUSCULAR

## 2018-01-23 NOTE — Progress Notes (Signed)
Patient came into the office today for her monthly Vitamin B12 injection. Patient tolerated injection well.  Last injection was 12/23/17. Vitamin B12 was last checked 02/01/17. Pt will return in 1 month for next Vit B12 injection.

## 2018-01-25 NOTE — Progress Notes (Signed)
Pt with vit B12 deficiency.  Agree with vit B12 1000 mcg IM in office today.  Signed:  Crissie Sickles, MD           01/25/2018

## 2018-02-13 ENCOUNTER — Other Ambulatory Visit: Payer: Self-pay | Admitting: *Deleted

## 2018-02-13 MED ORDER — PROMETHAZINE HCL 25 MG PO TABS: ORAL_TABLET | ORAL | 0 refills | Status: DC

## 2018-02-13 NOTE — Telephone Encounter (Signed)
Fax from [place]: pts daughter called stating that pt has surgery that requires her not to vomit. She was Rx'ed promethazine but is now out, they are requesting refill.   Please advise. Thanks.

## 2018-02-13 NOTE — Telephone Encounter (Signed)
Copied from [place] [id number]. Topic: Quick Communication - Rx Refill/Question >> Feb 13, 2018  3:11 PM Oliver Pila B wrote: Medication: promethazine (PHENERGAN) 25 MG tablet [[id number]]  Has the patient contacted their pharmacy? Yes.   (Agent: If no, request that the patient contact the pharmacy for the refill.) Preferred Pharmacy (with phone number or street name): [facility] Agent: Please be advised that RX refills may take up to 3 business days. We ask that you follow-up with your pharmacy.

## 2018-02-14 NOTE — Telephone Encounter (Signed)
Pt advised and voiced understanding.   

## 2018-02-19 ENCOUNTER — Encounter: Payer: Self-pay | Admitting: Family Medicine

## 2018-02-22 ENCOUNTER — Ambulatory Visit (INDEPENDENT_AMBULATORY_CARE_PROVIDER_SITE_OTHER): Payer: Medicare Other | Admitting: Family Medicine

## 2018-02-22 DIAGNOSIS — E538 Deficiency of other specified B group vitamins: Secondary | ICD-10-CM | POA: Diagnosis not present

## 2018-02-22 DIAGNOSIS — L501 Idiopathic urticaria: Secondary | ICD-10-CM

## 2018-02-22 HISTORY — DX: Idiopathic urticaria: L50.1

## 2018-02-22 MED ORDER — CYANOCOBALAMIN 1000 MCG/ML IJ SOLN
1000.0000 ug | INTRAMUSCULAR | Status: AC
  Administered 2018-02-22 – 2018-11-20 (×7): 1000 ug via INTRAMUSCULAR

## 2018-02-22 NOTE — Progress Notes (Signed)
Patient presented to office today for B12 injection, tolerated well.

## 2018-02-23 NOTE — Progress Notes (Signed)
Pt with vit B12 deficiency.  Agree with vit B12 1000 mcg IM in office today. Signed:  Crissie Sickles, MD           02/23/2018

## 2018-02-27 ENCOUNTER — Ambulatory Visit: Payer: Medicare Other

## 2018-02-27 ENCOUNTER — Ambulatory Visit: Payer: Medicare Other | Admitting: Family Medicine

## 2018-02-27 ENCOUNTER — Encounter: Payer: Self-pay | Admitting: Family Medicine

## 2018-02-27 VITALS — BP 130/82 | HR 70 | Temp 98.2°F | Resp 16 | Ht 68.5 in | Wt 147.5 lb

## 2018-02-27 DIAGNOSIS — M810 Age-related osteoporosis without current pathological fracture: Secondary | ICD-10-CM

## 2018-02-27 DIAGNOSIS — L501 Idiopathic urticaria: Secondary | ICD-10-CM | POA: Diagnosis not present

## 2018-02-27 MED ORDER — DULOXETINE HCL 60 MG PO CPEP: 60.0000 mg | ORAL_CAPSULE | Freq: Every day | ORAL | 3 refills | Status: DC

## 2018-02-27 MED ORDER — DENOSUMAB 60 MG/ML ~~LOC~~ SOSY
60.0000 mg | PREFILLED_SYRINGE | Freq: Once | SUBCUTANEOUS | Status: AC
  Administered 2018-02-27: 60 mg via SUBCUTANEOUS

## 2018-02-27 MED ORDER — FEXOFENADINE HCL 60 MG PO TABS: 60.0000 mg | ORAL_TABLET | Freq: Two times a day (BID) | ORAL | 1 refills | Status: DC

## 2018-02-27 NOTE — Progress Notes (Signed)
OFFICE VISIT  02/27/2018   CC:  Chief Complaint  Patient presents with  . Rash    x 3 days   HPI:    Patient is a 77 y.o. Caucasian female who presents for rash. Onset 3 d/a, inner thighs with itchy little red splotches.  Feels one on L wrist, also neck itching today but no rash. Tried otc hydrocortisone several times a day x 2d.   Lips feel "kinda numb/tingly", but no swelling or color change or skin lesion around mouth.  No SOB, no swelling of face/tongue/peri-orbital areas.  No fevers.  No recent URI or GI illness. Similar rash about 15 mo ago.  No others around her with rash.  No new foods, contacts, meds. Took benadryl once-no help.  Pt not sure if under more stress or anxiety lately.  Some days feels depressed but nothing consistent.   Past Medical History:  Diagnosis Date  . Abnormal EKG 05/2016   LAFB, voltage criteria for LVH in lead aVL.  Marland Kitchen Anxiety   . Cataract    right  . Colon cancer screening 06/2017   Cologuard positive--colonoscopy showed adenomatous polyp with high grade dysplasia--recall 3 yrs.  . Cystocele, unspecified (CODE) 2018   ? with vaginal prolapse.  Pessary fitted at Dr. Brynda Greathouse:  Milex ring with support #3--great improvement.  . Degenerative arthritis of cervical spine   . Fatty liver   . Fibromyalgia   . GERD (gastroesophageal reflux disease)   . History of hiatal hernia   . Hx: UTI (urinary tract infection)    recurrent.  Pyelo hospitalization 2012  . Hypertension   . Microhematuria 02/2012   Cystoscopy with bladder biopsy and cystouretograms--pathology benign.  . Myalgia    Hx of diffuse myalgias, onset in her 18s  . Osteoporosis 08/10/2016   T-score -2.5: started fosamax 07/2016.  Intol fosamax; started prolia 08/27/2016.  Marland Kitchen Periesophageal hiatal hernia 02/2016   Type 4  . Thickened endometrium 10/16/2011   on CT.  F/u pelvic u/s showed nabothian cyst to account for this..  No enometrial pathology noted on u/s, no ovaries visualized.  .  Vertigo   . Vitamin B12 deficiency 11/2015   + LL PN    Past Surgical History:  Procedure Laterality Date  . BACK SURGERY  1990s x 2   Dr. Leeanne Deed in [place]  . CHOLECYSTECTOMY    . COLONOSCOPY  approx 2002; 08/11/17   2002; Diverticulosis, int hemorrhoids ([facility]).  2018 (for + cologuard)--adenomatous polyps with high grade dysplasia (recall 3 yrs), also diverticulosis.  . COLONOSCOPY N/A 08/11/2017   Procedure: COLONOSCOPY;  Surgeon: Rogene Houston, MD;  [facility];  Service: Endoscopy;  Laterality: N/A;  7:30  . ESOPHAGEAL MANOMETRY N/A 04/19/2016   Normal.  Procedure: ESOPHAGEAL MANOMETRY (EM);  Surgeon: Mauri Pole, MD;  [facility];  Service: Endoscopy;  Laterality: N/A;  . EYE SURGERY Right    cataract extraction with IOL  . HYSTEROSCOPY  11/2004   Cervical hystogram   . INSERTION OF MESH N/A 06/18/2016   Procedure: INSERTION OF MESH;  Surgeon: Michael Boston, MD;  [facility]: [facility];  Service: General;  Laterality: N/A;  . NISSEN FUNDOPLICATION  25/95/6387  . POLYPECTOMY  08/11/2017   Procedure: POLYPECTOMY;  Surgeon: Rogene Houston, MD;  [facility];  Service: Endoscopy;;  colon     Outpatient Medications Prior to Visit  Medication Sig Dispense Refill  . albuterol (VENTOLIN HFA) 108 (90 BASE) MCG/ACT inhaler Inhale  1-2 puffs into the lungs every 4 (four) hours as needed for wheezing or shortness of breath. 1 Inhaler 0  . aspirin EC 81 MG tablet Take 1 tablet (81 mg total) by mouth. Every other day    . pantoprazole (PROTONIX) 40 MG tablet 1 tab po qd prn acid reflux (Patient taking differently: Take 40 mg by mouth daily as needed. ) 30 tablet 3  . promethazine (PHENERGAN) 25 MG tablet 1/2-1 tab po q6h prn nausea/vomiting 30 tablet 0  . DULoxetine (CYMBALTA) 60 MG capsule Take 1 capsule (60 mg total) by mouth daily. 30 capsule 3  . cyanocobalamin (,VITAMIN B-12,) 1000 MCG/ML injection Inject 1 mL (1,000 mcg total) into the muscle  once. (Patient taking differently: Inject 1,000 mcg into the muscle every 30 (thirty) days. ) 1 mL 0  . metroNIDAZOLE (METROGEL VAGINAL) 0.75 % vaginal gel Nightly x 5 nights (Patient not taking: Reported on 01/10/2018) 70 g 11   Facility-Administered Medications Prior to Visit  Medication Dose Route Frequency Provider Last Rate Last Dose  . cyanocobalamin ((VITAMIN B-12)) injection 1,000 mcg  1,000 mcg Intramuscular Q30 days Tammi Sou, MD   1,000 mcg at 02/22/18 0920    Allergies  Allergen Reactions  . Ciprofloxacin Nausea And Vomiting  . Codeine Nausea And Vomiting and Other (See Comments)    Feels funny  . Celebrex [Celecoxib] Rash  . Penicillins Rash    Has patient had a PCN reaction causing immediate rash, facial/tongue/throat swelling, SOB or lightheadedness with hypotension: Rash Has patient had a PCN reaction causing severe rash involving mucus membranes or skin necrosis: No Has patient had a PCN reaction that required hospitalization No Has patient had a PCN reaction occurring within the last 10 years: NO If all of the above answers are "NO", then may proceed with Cephalosporin use.    ROS As per HPI  PE: Blood pressure 130/82, pulse 70, temperature 98.2 F (36.8 C), temperature source Oral, resp. rate 16, height 5' 8.5" (1.74 m), weight 147 lb 8 oz (66.9 kg), SpO2 98 %.  Pt examined with Helayne Seminole, CMA, as chaperone.  Gen: Alert, well appearing.  Patient is oriented to person, place, time, and situation. AFFECT: pleasant, lucid thought and speech. ENT: Ears: EACs clear, normal epithelium.  TMs with good light reflex and landmarks bilaterally.  Eyes: no injection, icteris, swelling, or exudate.  EOMI, PERRLA. Nose: no drainage or turbinate edema/swelling.  No injection or focal lesion.  Mouth: lips without lesion/swelling.  Oral mucosa pink and moist.  Dentition intact and without obvious caries or gingival swelling.  Oropharynx without erythema, exudate, or  swelling.  Skin: inner thighs with scattered splotches of dark pink/violacious urticarial lesions--barely palpable.  +blanchable.  Nontender.  Left wrist with 2-3 similar lesions.  Neck: no rash.  LABS:  none  IMPRESSION AND PLAN:  1) Idiopathic urticaria, recurrent. Allegra 60 mg bid x 1 mo, then if no further hives she'll decrease to 60 mg qd dosing.  2) Osteoporosis: OK to get prolia injection today.  An After Visit Summary was printed and given to the patient.  FOLLOW UP: Return if symptoms worsen or fail to improve.  Signed:  Crissie Sickles, MD           02/27/2018

## 2018-03-27 ENCOUNTER — Ambulatory Visit (INDEPENDENT_AMBULATORY_CARE_PROVIDER_SITE_OTHER): Payer: Medicare Other

## 2018-03-27 DIAGNOSIS — E538 Deficiency of other specified B group vitamins: Secondary | ICD-10-CM

## 2018-03-27 NOTE — Progress Notes (Signed)
Patient presents today for Vitamin B12 injection 1 ml IM given with no incidence or problems. Patient left with no complaints.

## 2018-03-30 NOTE — Progress Notes (Signed)
Pt with vit B12 deficiency.  Agree with vit B12 1000 mcg IM in office today. Signed:  Crissie Sickles, MD           03/30/2018

## 2018-05-01 ENCOUNTER — Ambulatory Visit: Payer: Medicare Other | Admitting: Family Medicine

## 2018-05-05 ENCOUNTER — Ambulatory Visit: Payer: Medicare Other | Admitting: Family Medicine

## 2018-05-08 ENCOUNTER — Encounter: Payer: Self-pay | Admitting: Family Medicine

## 2018-05-08 ENCOUNTER — Ambulatory Visit: Payer: Medicare Other | Admitting: Family Medicine

## 2018-05-08 VITALS — BP 108/71 | HR 75 | Temp 98.3°F | Resp 16 | Ht 68.5 in | Wt 150.1 lb

## 2018-05-08 DIAGNOSIS — E538 Deficiency of other specified B group vitamins: Secondary | ICD-10-CM | POA: Diagnosis not present

## 2018-05-08 DIAGNOSIS — K219 Gastro-esophageal reflux disease without esophagitis: Secondary | ICD-10-CM | POA: Diagnosis not present

## 2018-05-08 DIAGNOSIS — M797 Fibromyalgia: Secondary | ICD-10-CM | POA: Diagnosis not present

## 2018-05-08 DIAGNOSIS — F411 Generalized anxiety disorder: Secondary | ICD-10-CM | POA: Diagnosis not present

## 2018-05-08 NOTE — Progress Notes (Signed)
OFFICE VISIT  05/08/2018   CC:  Chief Complaint  Patient presents with  . Follow-up    RCI, pt is not fasting.      HPI:    Patient is a 77 y.o. Caucasian female who presents for f/u GERD, anxiety, fibromyalgia. Labs 08/2017 normal.  Lipids 10/2017 good.  Feeling like mood/anxiety is stable.   GERD well controlled with PPI daily, not eating GERD diet. Fibromyalgia: body aches minimal, mostly soft tissues.  No joint swelling.  Her son is in [place], this is making her stress/she misses him a lot. No hives since last visit.  ROS: no wheezing or SOB, no fevers, no CP.  A bit of her BPPV has come up over the last couple of days--just mild.    Past Medical History:  Diagnosis Date  . Abnormal EKG 05/2016   LAFB, voltage criteria for LVH in lead aVL.  Marland Kitchen Anxiety   . Cataract    right  . Colon cancer screening 06/2017   Cologuard positive--colonoscopy showed adenomatous polyp with high grade dysplasia--recall 3 yrs.  . Cystocele, unspecified (CODE) 2018   ? with vaginal prolapse.  Pessary fitted at Dr. Brynda [place]:  Milex ring with support #3--great improvement.  . Degenerative arthritis of cervical spine   . Fatty liver   . Fibromyalgia   . GERD (gastroesophageal reflux disease)   . History of hiatal hernia   . Hx: UTI (urinary tract infection)    recurrent.  Pyelo hospitalization 2012  . Hypertension   . Microhematuria 02/2012   Cystoscopy with bladder biopsy and cystouretograms--pathology benign.  . Myalgia    Hx of diffuse myalgias, onset in her 61s  . Osteoporosis 08/10/2016   T-score -2.5: started fosamax 07/2016.  Intol fosamax; started prolia 08/27/2016.  Marland Kitchen Periesophageal hiatal hernia 02/2016   Type 4  . Thickened endometrium 10/16/2011   on CT.  F/u pelvic u/s showed nabothian cyst to account for this..  No enometrial pathology noted on u/s, no ovaries visualized.  . Vertigo   . Vitamin B12 deficiency 11/2015   + LL PN    Past Surgical History:  Procedure  Laterality Date  . BACK SURGERY  1990s x 2   Dr. Leeanne Deed in [place]  . CHOLECYSTECTOMY    . COLONOSCOPY  approx 2002; 08/11/17   2002; Diverticulosis, int hemorrhoids ([facility]).  2018 (for + cologuard)--adenomatous polyps with high grade dysplasia (recall 3 yrs), also diverticulosis.  . COLONOSCOPY N/A 08/11/2017   Procedure: COLONOSCOPY;  Surgeon: Rogene Houston, MD;  Location: [facility];  Service: Endoscopy;  Laterality: N/A;  7:30  . ESOPHAGEAL MANOMETRY N/A 04/19/2016   Normal.  Procedure: ESOPHAGEAL MANOMETRY (EM);  Surgeon: Mauri Pole, MD;  Location: [facility];  Service: Endoscopy;  Laterality: N/A;  . EYE SURGERY Right    cataract extraction with IOL  . HYSTEROSCOPY  11/2004   Cervical hystogram   . INSERTION OF MESH N/A 06/18/2016   Procedure: INSERTION OF MESH;  Surgeon: Michael Boston, MD;  Location: [facility];  Service: General;  Laterality: N/A;  . NISSEN FUNDOPLICATION  81/19/1478  . POLYPECTOMY  08/11/2017   Procedure: POLYPECTOMY;  Surgeon: Rogene Houston, MD;  Location: [facility];  Service: Endoscopy;;  colon     Outpatient Medications Prior to Visit  Medication Sig Dispense Refill  . albuterol (VENTOLIN HFA) 108 (90 BASE) MCG/ACT inhaler Inhale 1-2 puffs into the lungs every 4 (four) hours as needed for wheezing or shortness of breath. 1  Inhaler 0  . aspirin EC 81 MG tablet Take 1 tablet (81 mg total) by mouth. Every other day    . cyanocobalamin (,VITAMIN B-12,) 1000 MCG/ML injection Inject 1 mL (1,000 mcg total) into the muscle once. (Patient taking differently: Inject 1,000 mcg into the muscle every 30 (thirty) days. ) 1 mL 0  . DULoxetine (CYMBALTA) 60 MG capsule Take 1 capsule (60 mg total) by mouth daily. 30 capsule 3  . fexofenadine (ALLEGRA) 60 MG tablet Take 1 tablet (60 mg total) by mouth 2 (two) times daily. 60 tablet 1  . pantoprazole (PROTONIX) 40 MG tablet 1 tab po qd prn acid reflux (Patient taking differently: Take 40 mg by mouth daily as  needed. ) 30 tablet 3  . promethazine (PHENERGAN) 25 MG tablet 1/2-1 tab po q6h prn nausea/vomiting 30 tablet 0   Facility-Administered Medications Prior to Visit  Medication Dose Route Frequency Provider Last Rate Last Dose  . cyanocobalamin ((VITAMIN B-12)) injection 1,000 mcg  1,000 mcg Intramuscular Q30 days Tammi Sou, MD   1,000 mcg at 05/08/18 1419    Allergies  Allergen Reactions  . Ciprofloxacin Nausea And Vomiting  . Codeine Nausea And Vomiting and Other (See Comments)    Feels funny  . Celebrex [Celecoxib] Rash  . Penicillins Rash    Has patient had a PCN reaction causing immediate rash, facial/tongue/throat swelling, SOB or lightheadedness with hypotension: Rash Has patient had a PCN reaction causing severe rash involving mucus membranes or skin necrosis: No Has patient had a PCN reaction that required hospitalization No Has patient had a PCN reaction occurring within the last 10 years: NO If all of the above answers are "NO", then may proceed with Cephalosporin use.    ROS As per HPI  PE: Blood pressure 108/71, pulse 75, temperature 98.3 F (36.8 C), temperature source Oral, resp. rate 16, height 5' 8.5" (1.74 m), weight 150 lb 2 oz (68.1 kg), SpO2 96 %. Gen: Alert, well appearing.  Patient is oriented to person, place, time, and situation. AFFECT: pleasant, lucid thought and speech. CV: RRR, no m/r/g.   LUNGS: CTA bilat, nonlabored resps, good aeration in all lung fields. EXT: no clubbing, cyanosis, or edema.    LABS:  Lab Results  Component Value Date   TSH 2.17 03/15/2017   Lab Results  Component Value Date   WBC 6.2 08/29/2017   HGB 13.7 08/29/2017   HCT 41.0 08/29/2017   MCV 94.6 08/29/2017   PLT 245.0 08/29/2017   Lab Results  Component Value Date   CREATININE 0.62 08/29/2017   BUN 11 08/29/2017   NA 138 08/29/2017   K 4.1 08/29/2017   CL 100 08/29/2017   CO2 31 08/29/2017   Lab Results  Component Value Date   ALT 8 08/29/2017    AST 15 08/29/2017   ALKPHOS 60 08/29/2017   BILITOT 0.9 08/29/2017   Lab Results  Component Value Date   CHOL 175 10/28/2017   Lab Results  Component Value Date   HDL 60.60 10/28/2017   Lab Results  Component Value Date   LDLCALC 98 10/28/2017   Lab Results  Component Value Date   TRIG 81.0 10/28/2017   Lab Results  Component Value Date   CHOLHDL 3 10/28/2017   Lab Results  Component Value Date   HGBA1C 5.7 12/19/2015    IMPRESSION AND PLAN:  1) Anxiety and fibromyalgia: stable on cymbalta--no changes at this time. Encouraged as much activity as possible, healthy diet, activities  with family/husband.  2) GERD: The current medical regimen is effective;  continue present plan and medications. She wants to stay on this med daily long term.  3) Hives--idiopathic.  No recurrence since last time I saw her 2 mo ago.  An After Visit Summary was printed and given to the patient.  FOLLOW UP: Return in about 6 months (around 11/08/2018) for annual CPE (fasting).  Signed:  Crissie Sickles, MD           05/08/2018

## 2018-05-12 ENCOUNTER — Encounter: Payer: Self-pay | Admitting: Obstetrics & Gynecology

## 2018-05-12 ENCOUNTER — Other Ambulatory Visit: Payer: Self-pay

## 2018-05-12 ENCOUNTER — Ambulatory Visit: Payer: Medicare Other | Admitting: Obstetrics & Gynecology

## 2018-05-12 VITALS — BP 149/88 | HR 67 | Ht 67.5 in | Wt 152.0 lb

## 2018-05-12 DIAGNOSIS — Z4689 Encounter for fitting and adjustment of other specified devices: Secondary | ICD-10-CM | POA: Diagnosis not present

## 2018-05-12 DIAGNOSIS — N8110 Cystocele, unspecified: Secondary | ICD-10-CM

## 2018-05-12 NOTE — Progress Notes (Signed)
Chief Complaint  Patient presents with  . pessary maintenance    Blood pressure (!) 149/88, pulse 67, height 5' 7.5" (1.715 m), weight 152 lb (68.9 kg).  Karen Delacruz presents today for routine follow up related to her pessary.   She uses a Milex ring with support #3 She reports no vaginal discharge or vaginal bleeding.  Exam reveals no undue vaginal mucosal pressure of breakdown, no discharge and no vaginal bleeding.  The pessary is removed, cleaned and replaced without difficulty.    Karen Delacruz will be sen back in 4 months for continued follow up.  Florian Buff, MD  05/12/2018 11:24 AM

## 2018-05-17 ENCOUNTER — Encounter: Payer: Self-pay | Admitting: Family Medicine

## 2018-06-14 ENCOUNTER — Ambulatory Visit (INDEPENDENT_AMBULATORY_CARE_PROVIDER_SITE_OTHER): Payer: Medicare Other | Admitting: *Deleted

## 2018-06-14 DIAGNOSIS — E538 Deficiency of other specified B group vitamins: Secondary | ICD-10-CM | POA: Diagnosis not present

## 2018-06-14 NOTE — Progress Notes (Addendum)
Karen Delacruz is a 77 y.o. female presents to the office today for her monthly vitamin B12 injection, per physician's orders. Original order:  Cyanocobalamin,1025mcg/mL, IM was administered right deltoid today. Patient tolerated injection. Patient due for follow up labs/provider appt: No. Date due: N/A appt made No Patient next injection due: 07/15/18, appt made Yes apt 07/17/18  [place] screening examination/treatment/procedure(s) were performed by non-physician practitioner and as supervising physician I was immediately available for consultation/collaboration.  I agree with above assessment and plan.  Electronically Signed by: Howard Pouch, DO [facility]

## 2018-07-17 ENCOUNTER — Ambulatory Visit (INDEPENDENT_AMBULATORY_CARE_PROVIDER_SITE_OTHER): Payer: Medicare Other | Admitting: *Deleted

## 2018-07-17 DIAGNOSIS — Z23 Encounter for immunization: Secondary | ICD-10-CM

## 2018-07-17 DIAGNOSIS — E538 Deficiency of other specified B group vitamins: Secondary | ICD-10-CM | POA: Diagnosis not present

## 2018-07-17 NOTE — Progress Notes (Signed)
Patient presents today for b12 injection. Patient tolerated injection well.

## 2018-08-16 ENCOUNTER — Ambulatory Visit (INDEPENDENT_AMBULATORY_CARE_PROVIDER_SITE_OTHER): Payer: Medicare Other

## 2018-08-16 DIAGNOSIS — E538 Deficiency of other specified B group vitamins: Secondary | ICD-10-CM

## 2018-08-16 MED ORDER — CYANOCOBALAMIN 1000 MCG/ML IJ SOLN
1000.0000 ug | Freq: Once | INTRAMUSCULAR | Status: AC
  Administered 2018-08-16: 1000 ug via INTRAMUSCULAR

## 2018-08-16 NOTE — Progress Notes (Signed)
Patient presents today for Vitamin B12 injection. Patient tolerated well and left with no complications.

## 2018-08-28 NOTE — Progress Notes (Signed)
Pt with vit B12 deficiency.  Agree with vit B12 1000 mcg IM in office today.

## 2018-08-30 ENCOUNTER — Ambulatory Visit (INDEPENDENT_AMBULATORY_CARE_PROVIDER_SITE_OTHER): Payer: Medicare Other | Admitting: Family Medicine

## 2018-08-30 DIAGNOSIS — M810 Age-related osteoporosis without current pathological fracture: Secondary | ICD-10-CM | POA: Diagnosis not present

## 2018-08-30 MED ORDER — DENOSUMAB 60 MG/ML ~~LOC~~ SOSY
60.0000 mg | PREFILLED_SYRINGE | Freq: Once | SUBCUTANEOUS | Status: AC
  Administered 2018-08-30: 60 mg via SUBCUTANEOUS

## 2018-08-30 NOTE — Progress Notes (Signed)
Patient presented for 6 month prolia injection, tolerated well.

## 2018-09-01 ENCOUNTER — Other Ambulatory Visit: Payer: Self-pay

## 2018-09-01 ENCOUNTER — Ambulatory Visit: Payer: Medicare Other | Admitting: Family Medicine

## 2018-09-01 ENCOUNTER — Encounter: Payer: Self-pay | Admitting: Family Medicine

## 2018-09-01 VITALS — BP 124/80 | HR 80 | Temp 98.3°F | Ht 67.5 in | Wt 147.8 lb

## 2018-09-01 DIAGNOSIS — B349 Viral infection, unspecified: Secondary | ICD-10-CM

## 2018-09-01 LAB — POC INFLUENZA A&B (BINAX/QUICKVUE)
Influenza A, POC: NEGATIVE
Influenza B, POC: NEGATIVE

## 2018-09-01 NOTE — Progress Notes (Signed)
Subjective   CC:  Chief Complaint  Patient presents with  . Cough    nasal congestion, body aches, headache, chills x 1 day    HPI: Karen Delacruz is a 76 y.o. female who presents to the office today to address the problems listed above in the chief complaint.  Patient complains of flu like symptoms including myalgias, ST, mild cough and some congestion with clear rhinorrhea and mild loose stools. Sxs have been present for 24 hours. She has tried to alleviate the sxs with over-the-counter medicines with mild relief.  No SOB or CP are present. Taking in fluids adequately; decreased appetite bt no significant n/v/d. She has had the flu vaccine this season.   Assessment  1. Viral syndrome      Plan   Supportive care: see avs. otc meds. sxs may worsen before they improve. Discussed red flag sxs of fever, sob, pleuritic cp.   Follow up: prn   Orders Placed This Encounter  Procedures  . POC Influenza A&B(BINAX/QUICKVUE)   No orders of the defined types were placed in this encounter.    I reviewed the patients updated PMH, FH, and SocHx.    Patient Active Problem List   Diagnosis Date Noted  . Positive colorectal cancer screening using Cologuard test 07/28/2017  . Osteoporosis 08/10/2016  . Preoperative evaluation to rule out surgical contraindication   . Paraesophageal hiatal hernia 03/29/2016  . Diverticulosis of sigmoid colon 03/29/2016  . Vitamin B12 deficiency 02/17/2016  . Maxillary sinusitis 10/07/2015  . Fibromyalgia 08/21/2014  . GERD (gastroesophageal reflux disease) 08/21/2014  . Hyperlipidemia 08/21/2014  . Preventative health care 08/21/2014  . Left shoulder pain 12/31/2013  . Breast cancer screening 12/31/2013  . Colon cancer screening 04/06/2013  . Paresthesias in right hand 12/22/2012  . Trigger point of right side of body 12/22/2012  . Varicose veins 09/04/2012  . Right knee pain 08/17/2012  . Myalgia 07/31/2012  . Trigger point with neck pain  07/31/2012  . SYNOVITIS, ANKLE 05/13/2008  . TENOSYNOVITIS OF FOOT AND ANKLE 05/13/2008   Current Meds  Medication Sig  . albuterol (VENTOLIN HFA) 108 (90 BASE) MCG/ACT inhaler Inhale 1-2 puffs into the lungs every 4 (four) hours as needed for wheezing or shortness of breath.  Marland Kitchen aspirin EC 81 MG tablet Take 1 tablet (81 mg total) by mouth. Every other day  . cyanocobalamin (,VITAMIN B-12,) 1000 MCG/ML injection Inject 1 mL (1,000 mcg total) into the muscle once. (Patient taking differently: Inject 1,000 mcg into the muscle every 30 (thirty) days. )  . DULoxetine (CYMBALTA) 60 MG capsule Take 1 capsule (60 mg total) by mouth daily.  . fexofenadine (ALLEGRA) 60 MG tablet Take 1 tablet (60 mg total) by mouth 2 (two) times daily.  . pantoprazole (PROTONIX) 40 MG tablet 1 tab po qd prn acid reflux (Patient taking differently: Take 40 mg by mouth daily as needed. )   Current Facility-Administered Medications for the 09/01/18 encounter (Office Visit) with Leamon Arnt, MD  Medication  . cyanocobalamin ((VITAMIN B-12)) injection 1,000 mcg   Family History: Patient family history includes Cancer in her mother; Other in her father. Social History:  Patient  reports that she has never smoked. She has never used smokeless tobacco. She reports that she does not drink alcohol or use drugs.  Review of Systems: Constitutional: negative for fever or malaise Ophthalmic: negative for photophobia, double vision or loss of vision Cardiovascular: negative for chest pain, dyspnea on exertion, or new  LE swelling Respiratory: negative for SOB or persistent cough Gastrointestinal: negative for abdominal pain, change in bowel habits or melena Genitourinary: negative for dysuria or gross hematuria Musculoskeletal: negative for new gait disturbance or muscular weakness Integumentary: negative for new or persistent rashes Neurological: negative for TIA or stroke symptoms Psychiatric: negative for SI or  delusions Allergic/Immunologic: negative for hives  Objective  Vitals: BP 124/80   Pulse 80   Temp 98.3 F (36.8 C)   Ht 5' 7.5" (1.715 m)   Wt 147 lb 12.8 oz (67 kg)   SpO2 96%   BMI 22.81 kg/m  General: no acute respiratory distress  Psych:  Alert and oriented, normal mood and affect HEENT: Normocephalic, nasal congestion present, TMs w/o erythema, OP with minimal erythema w/o exudate, no LAD, supple neck  Cardiovascular:  RRR without murmur or gallop. no peripheral edema Respiratory:  Good breath sounds bilaterally, CTAB with normal respiratory effort Gastrointestinal: soft, flat abdomen, normal active bowel sounds, no palpable masses, no hepatosplenomegaly, no appreciated hernias Skin:  Warm, no rashes Neurologic:   Mental status is normal. normal gait  Neg rapid flu test today.    Commons side effects, risks, benefits, and alternatives for medications and treatment plan prescribed today were discussed, and the patient expressed understanding of the given instructions. Patient is instructed to call or message via MyChart if he/she has any questions or concerns regarding our treatment plan. No barriers to understanding were identified. We discussed Red Flag symptoms and signs in detail. Patient expressed understanding regarding what to do in case of urgent or emergency type symptoms.   Medication list was reconciled, printed and provided to the patient in AVS. Patient instructions and summary information was reviewed with the patient as documented in the AVS. This note was prepared with assistance of Dragon voice recognition software. Occasional wrong-word or sound-a-like substitutions may have occurred due to the inherent limitations of voice recognition software

## 2018-09-01 NOTE — Patient Instructions (Signed)
Please follow up if symptoms do not improve or as needed.   Start advil 2 tablets with meals to help with your sore throat and body aches.  You may use sudafed or dayquil during the day to help with the runny nose and nasal congestion.  You may use Delsym cough syrup or Mucinex DM to help with congestion and coughing.   Viral Illness, Adult Viruses are tiny germs that can get into a person's body and cause illness. There are many different types of viruses, and they cause many types of illness. Viral illnesses can range from mild to severe. They can affect various parts of the body. Common illnesses that are caused by a virus include colds and the flu. Viral illnesses also include serious conditions such as HIV/AIDS (human immunodeficiency virus/acquired immunodeficiency syndrome). A few viruses have been linked to certain cancers. What are the causes? Many types of viruses can cause illness. Viruses invade cells in your body, multiply, and cause the infected cells to malfunction or die. When the cell dies, it releases more of the virus. When this happens, you develop symptoms of the illness, and the virus continues to spread to other cells. If the virus takes over the function of the cell, it can cause the cell to divide and grow out of control, as is the case when a virus causes cancer. Different viruses get into the body in different ways. You can get a virus by:  Swallowing food or water that is contaminated with the virus.  Breathing in droplets that have been coughed or sneezed into the air by an infected person.  Touching a surface that has been contaminated with the virus and then touching your eyes, nose, or mouth.  Being bitten by an insect or animal that carries the virus.  Having sexual contact with a person who is infected with the virus.  Being exposed to blood or fluids that contain the virus, either through an open cut or during a transfusion.  If a virus enters your body,  your body's defense system (immune system) will try to fight the virus. You may be at higher risk for a viral illness if your immune system is weak. What are the signs or symptoms? Symptoms vary depending on the type of virus and the location of the cells that it invades. Common symptoms of the main types of viral illnesses include: Cold and flu viruses  Fever.  Headache.  Sore throat.  Muscle aches.  Nasal congestion.  Cough. Digestive system (gastrointestinal) viruses  Fever.  Abdominal pain.  Nausea.  Diarrhea. Liver viruses (hepatitis)  Loss of appetite.  Tiredness.  Yellowing of the skin (jaundice). Brain and spinal cord viruses  Fever.  Headache.  Stiff neck.  Nausea and vomiting.  Confusion or sleepiness. Skin viruses  Warts.  Itching.  Rash. Sexually transmitted viruses  Discharge.  Swelling.  Redness.  Rash. How is this treated? Viruses can be difficult to treat because they live within cells. Antibiotic medicines do not treat viruses because these drugs do not get inside cells. Treatment for a viral illness may include:  Resting and drinking plenty of fluids.  Medicines to relieve symptoms. These can include over-the-counter medicine for pain and fever, medicines for cough or congestion, and medicines to relieve diarrhea.  Antiviral medicines. These drugs are available only for certain types of viruses. They may help reduce flu symptoms if taken early. There are also many antiviral medicines for hepatitis and HIV/AIDS.  Some viral illnesses can  be prevented with vaccinations. A common example is the flu shot. Follow these instructions at home: Medicines   Take over-the-counter and prescription medicines only as told by your health care provider.  If you were prescribed an antiviral medicine, take it as told by your health care provider. Do not stop taking the medicine even if you start to feel better.  Be aware of when antibiotics  are needed and when they are not needed. Antibiotics do not treat viruses. If your health care provider thinks that you may have a bacterial infection as well as a viral infection, you may get an antibiotic. ? Do not ask for an antibiotic prescription if you have been diagnosed with a viral illness. That will not make your illness go away faster. ? Frequently taking antibiotics when they are not needed can lead to antibiotic resistance. When this develops, the medicine no longer works against the bacteria that it normally fights. General instructions  Drink enough fluids to keep your urine clear or pale yellow.  Rest as much as possible.  Return to your normal activities as told by your health care provider. Ask your health care provider what activities are safe for you.  Keep all follow-up visits as told by your health care provider. This is important. How is this prevented? Take these actions to reduce your risk of viral infection:  Eat a healthy diet and get enough rest.  Wash your hands often with soap and water. This is especially important when you are in public places. If soap and water are not available, use hand sanitizer.  Avoid close contact with friends and family who have a viral illness.  If you travel to areas where viral gastrointestinal infection is common, avoid drinking water or eating raw food.  Keep your immunizations up to date. Get a flu shot every year as told by your health care provider.  Do not share toothbrushes, nail clippers, razors, or needles with other people.  Always practice safe sex.  Contact a health care provider if:  You have symptoms of a viral illness that do not go away.  Your symptoms come back after going away.  Your symptoms get worse. Get help right away if:  You have trouble breathing.  You have a severe headache or a stiff neck.  You have severe vomiting or abdominal pain. This information is not intended to replace advice  given to you by your health care provider. Make sure you discuss any questions you have with your health care provider. Document Released: 02/20/2016 Document Revised: 03/24/2016 Document Reviewed: 02/20/2016 Elsevier Interactive Patient Education  [employer].

## 2018-09-06 ENCOUNTER — Ambulatory Visit: Payer: Medicare Other | Admitting: Obstetrics & Gynecology

## 2018-09-14 ENCOUNTER — Encounter: Payer: Self-pay | Admitting: Obstetrics & Gynecology

## 2018-09-14 ENCOUNTER — Ambulatory Visit (INDEPENDENT_AMBULATORY_CARE_PROVIDER_SITE_OTHER): Payer: Medicare Other | Admitting: Obstetrics & Gynecology

## 2018-09-14 VITALS — BP 138/85 | HR 75 | Ht 68.0 in | Wt 149.0 lb

## 2018-09-14 DIAGNOSIS — N760 Acute vaginitis: Secondary | ICD-10-CM

## 2018-09-14 DIAGNOSIS — B9689 Other specified bacterial agents as the cause of diseases classified elsewhere: Secondary | ICD-10-CM | POA: Diagnosis not present

## 2018-09-14 DIAGNOSIS — Z4689 Encounter for fitting and adjustment of other specified devices: Secondary | ICD-10-CM | POA: Diagnosis not present

## 2018-09-14 MED ORDER — METRONIDAZOLE 0.75 % VA GEL: VAGINAL | 0 refills | Status: DC

## 2018-09-14 NOTE — Progress Notes (Signed)
Patient ID: Karen Delacruz, female   DOB: 1941-06-15, 77 y.o.   MRN: [id number] Chief Complaint  Patient presents with  . Pessary cleaning    Blood pressure 138/85, pulse 75, height 5\' 8"  (1.727 m), weight 149 lb (67.6 kg).  Karen Delacruz presents today for routine follow up related to her pessary.   She uses a Milex ring with support #3 She reports some malodorous vaginal discharge occasionally  But no vaginal bleeding.  Exam reveals no undue vaginal mucosal pressure of breakdown, no discharge and no vaginal bleeding.  The pessary is removed, cleaned and replaced without difficulty.    Meds ordered this encounter  Medications  . metroNIDAZOLE (METROGEL VAGINAL) 0.75 % vaginal gel    Sig: Nightly x 5 nights    Dispense:  70 g    Refill:  0     Karen Delacruz will be sen back in 4 months for continued follow up.  Florian Buff, MD  09/14/2018 4:04 PM

## 2018-09-18 ENCOUNTER — Ambulatory Visit (INDEPENDENT_AMBULATORY_CARE_PROVIDER_SITE_OTHER): Payer: Medicare Other

## 2018-09-18 DIAGNOSIS — E538 Deficiency of other specified B group vitamins: Secondary | ICD-10-CM

## 2018-09-18 NOTE — Progress Notes (Signed)
Patient in for monthly B12 injections.  B12 1,045mcg IM in right deltoid, tolerated well.  Next injection scheduled for 10/19/18.   Roderic Ovens, RN

## 2018-10-19 ENCOUNTER — Ambulatory Visit (INDEPENDENT_AMBULATORY_CARE_PROVIDER_SITE_OTHER): Payer: Medicare Other | Admitting: Family Medicine

## 2018-10-19 DIAGNOSIS — E538 Deficiency of other specified B group vitamins: Secondary | ICD-10-CM

## 2018-10-19 MED ORDER — CYANOCOBALAMIN 1000 MCG/ML IJ SOLN
1000.0000 ug | Freq: Once | INTRAMUSCULAR | Status: AC
  Administered 2018-10-19: 1000 ug via INTRAMUSCULAR

## 2018-10-19 NOTE — Progress Notes (Signed)
Patient presented for monthly Vitamin B 12 injection.  Patient tolerated injection well.

## 2018-10-23 ENCOUNTER — Ambulatory Visit: Payer: Medicare Other | Admitting: Family Medicine

## 2018-10-23 ENCOUNTER — Encounter: Payer: Self-pay | Admitting: Family Medicine

## 2018-10-23 VITALS — BP 132/81 | HR 75 | Temp 97.9°F | Resp 16 | Ht 67.5 in | Wt 150.0 lb

## 2018-10-23 DIAGNOSIS — T25222A Burn of second degree of left foot, initial encounter: Secondary | ICD-10-CM

## 2018-10-23 MED ORDER — SILVER SULFADIAZINE 1 % EX CREA: 1.0000 "application " | TOPICAL_CREAM | Freq: Every day | CUTANEOUS | 0 refills | Status: DC

## 2018-10-23 NOTE — Patient Instructions (Addendum)
Use the silvadene on your toe daily.  Keep clean and covered- especially after blister ruptures.  Watch for signs of infection- increase redness or drainage.  Keep it elevated- tylenol for pain.   F/u 1 week with PCP, sooner if blister ruptures with any signs of infection.    Second-Degree Burn, Adult A second-degree burn, also called a partial thickness wound, is a serious injury that affects the first two layers of skin. A second-degree burn may be minor or major, depending on the size of the burn and which parts of the skin are burned. What are the causes? This condition may be caused by:  Heat. Burns caused by heat happen when skin comes in contact with something very hot, such as a flame or hot liquid.  Radiation. Sources of radiation include sunlight, radiation used to heat food, and radiation treatments.  Electricity. Burns caused by electricity happen when electricity passes through the body from lightning, wiring, electrical outlets, appliances, or power lines.  Certain chemicals, such as acids that come in contact with the skin or eyes. Some chemicals can go through clothing. What increases the risk? This condition is more likely to occur in people who:  Are often exposed to high-risk environments, such as those with open flames, chemicals, or electricity.  Have cancer and are being treated with radiation. What are the signs or symptoms? Symptoms of this condition include:  Severe pain.  Skin that is deep red, blistered, tender, and swollen.  Skin that has changed color.  Skin that looks blotchy, wet, or shiny. How is this diagnosed? This condition is usually diagnosed with an exam of the wounded area. To get a better look at the wound, your health care provider may remove any blistered skin. It may take several days to find out if you have a second-degree burn because the signs of this kind of burn can take time to develop. You may need to watch the wound for changes  at home and visit a health care provider repeatedly to have your wound checked for changes. If the wound is large, you may need to stay in the hospital so a health care team can examine the wound for a few days. How is this treated? Treatment depends on the severity and cause of the burn. Healing may take several weeks. Some second-degree burns, including major burns, electrical burns, and chemical burns, may need to be treated in a hospital. Treatment may involve:  Cooling the burn with cool, germ-free (sterile) water.  Taking or applying medicines, such as: ? Medicines to relieve pain or itching. ? Ointments to treat or prevent infection. ? Antibiotic medicine to treat or prevent infection.  Getting a tetanus shot.  Covering the burn with a bandage (dressing). If your fingers or toes were burned, each of them may be bandaged separately.  Compression dressings to prevent scarring and help the burned body part stay moveable.  Removing dead skin. This is done by a health care provider. Do not try to remove dead skin yourself. Deep burns can cause skin tissue to die, and a scab (eschar) may form where the skin used to be. If you have a deep burn and an eschar forms, you may need surgery to remove the eschar so the skin can heal properly. If your wound is deep and large (it covers more than 15% of your skin), treatment may also involve:  Receiving fluids and nutrition.  Close monitoring of blood flow near the wound.  Oxygen given through  a mask or a machine (ventilator). This may be needed if a burn causes fluid shifts in the body that make it hard to breathe. Follow these instructions at home: Wound care   Follow instructions from your health care provider about how to take care of your wound. Make sure you: ? Wash your hands with soap and water before you change your dressing. If soap and water are not available, use hand sanitizer. ? Change your dressing as directed. ? If you have a  compression dressing, wear it as directed.  Clean your wound 2-3 times a day or as often as directed. ? Wash the wound with mild soap and water. ? Rinse the wound with water to remove all soap. ? Pat the wound dry with a clean towel. Do not rub it.  Check your wound every day for signs of infection. Check for: ? More redness, swelling, or pain. ? More fluid or blood. ? Warmth. ? Pus or a bad smell. ? Yellow or green fluid.  Do not scratch or pick at the wound  Do not break any blisters or peel any skin.  Avoid exposing your wound to the sun. Medicine  Take and apply over-the-counter and prescription medicines only as told by your health care provider.  Take or apply your antibiotic medicine as told by your health care provider. Do not stop using the antibiotic even if you start to feel better. Eating and drinking  Drink enough fluid to keep your urine clear or pale yellow.  Eat a nutritious diet that is high in protein. This will help your wound to heal. General instructions  Raise (elevate) the injured area above the level of your heart while sitting or lying down.  Rest as directed. Do not exercise until your health care provider approves.  Do not take baths, swim, or use a hot tub until your health care provider approves.  Do not put ice on your burn. This can cause more damage. Try cooling the burn with: ? Cool water. ? A cool, wet, clean cloth (cool compress).  Keep all follow-up visits as directed. This is important. Contact a health care provider if:  Your symptoms do not improve with treatment.  Your pain is not relieved with medicine.  You have more redness, swelling, or pain around your wound.  You have more fluid or blood coming from your wound.  You have yellow or green fluid, pus, or a bad smell coming from your wound.  Your wound feels warm to the touch.  You have a fever. Get help right away if:  You develop red streaks near the wound.  You  develop severe pain. Summary  A second-degree burn is a serious injury that affects the first two layers of skin.  Clean your wound 2-3 times a day or as often as directed. Check your wound every day for signs of infection.  Do not scratch or pick at your wound, break blisters, peel skin, or put ice on your burn. This information is not intended to replace advice given to you by your health care provider. Make sure you discuss any questions you have with your health care provider. Document Released: 03/15/2011 Document Revised: 09/21/2016 Document Reviewed: 09/21/2016 Elsevier Interactive Patient Education  2019 [employer].

## 2018-10-23 NOTE — Progress Notes (Signed)
Karen Delacruz , April 02, 1941, 77 y.o., female MRN: [id number] Patient Care Team    Relationship Specialty Notifications Start End  McGowen, Adrian Blackwater, MD PCP - General Family Medicine  07/31/12    Comment: Merged (Karen Delacruz)  Michael Boston, MD Consulting Physician General Surgery  03/29/16   Karen Pole, MD Consulting Physician Gastroenterology  04/28/16   Karen Buff, MD Consulting Physician Obstetrics and Gynecology  06/28/17   Karen Houston, MD Consulting Physician Gastroenterology  08/16/17     Chief Complaint  Patient presents with  . Foot Burn    happened on 10/16/2018, spilled hot grease on her foot/Greater toe on Lt foot     Subjective: Pt presents for an OV with complaints of burn of her left large toe of 1 week duration.  Associated symptoms include pain and blister. She was cooking [place] xmas eve and dripped hot [place] grease on her toe. She has been keeping area cleaned and foot elevated. She reports no redness or drainage. She denies fever or chills.  Pt has tried nothing to ease their symptoms.   Depression screen [facility] 2/9 09/01/2018 06/02/2017 08/04/2016 08/04/2016  Decreased Interest 0 0 0 0  Down, Depressed, Hopeless 0 0 0 0  PHQ - 2 Score 0 0 0 0    Allergies  Allergen Reactions  . Ciprofloxacin Nausea And Vomiting  . Codeine Nausea And Vomiting and Other (See Comments)    Feels funny  . Celebrex [Celecoxib] Rash  . Penicillins Rash    Has patient had a PCN reaction causing immediate rash, facial/tongue/throat swelling, SOB or lightheadedness with hypotension: Rash Has patient had a PCN reaction causing severe rash involving mucus membranes or skin necrosis: No Has patient had a PCN reaction that required hospitalization No Has patient had a PCN reaction occurring within the last 10 years: NO If all of the above answers are "NO", then may proceed with Cephalosporin use.   Social History   Tobacco Use  . Smoking status: Never Smoker  . Smokeless  tobacco: Never Used  Substance Use Topics  . Alcohol use: No   Past Medical History:  Diagnosis Date  . Abnormal EKG 05/2016   LAFB, voltage criteria for LVH in lead aVL.  Marland Kitchen Anxiety   . Cataract    right  . Colon cancer screening 06/2017   Cologuard positive--colonoscopy showed adenomatous polyp with high grade dysplasia--recall 3 yrs.  . Cystocele, unspecified (CODE) 2018   Pessary fitted at Dr. Brynda [facility]:  Milex ring with support #3--great improvement.  . Degenerative arthritis of cervical spine   . Fatty liver   . Fibromyalgia   . GERD (gastroesophageal reflux disease)   . History of hiatal hernia   . Hx: UTI (urinary tract infection)    recurrent.  Pyelo hospitalization 2012  . Hypertension   . Idiopathic urticaria 02/2018  . Microhematuria 02/2012   Cystoscopy with bladder biopsy and cystouretograms--pathology benign.  . Myalgia    Hx of diffuse myalgias, onset in her 91s  . Osteoporosis 08/10/2016   T-score -2.5: started fosamax 07/2016.  Intol fosamax; started prolia 08/27/2016.  Marland Kitchen Periesophageal hiatal hernia 02/2016   Type 4  . Thickened endometrium 10/16/2011   on CT.  F/u pelvic u/s showed nabothian cyst to account for this..  No enometrial pathology noted on u/s, no ovaries visualized.  . Vertigo   . Vitamin B12 deficiency 11/2015   + LL PN   Past Surgical History:  Procedure Laterality Date  .  BACK SURGERY  1990s x 2   Dr. Leeanne Deed in [place]  . CHOLECYSTECTOMY    . COLONOSCOPY  approx 2002; 08/11/17   2002; Diverticulosis, int hemorrhoids ([facility]).  2018 (for + cologuard)--adenomatous polyps with high grade dysplasia (recall 3 yrs), also diverticulosis.  . COLONOSCOPY N/A 08/11/2017   Procedure: COLONOSCOPY;  Surgeon: Karen Houston, MD;  [facility];  Service: Endoscopy;  Laterality: N/A;  7:30  . ESOPHAGEAL MANOMETRY N/A 04/19/2016   Normal.  Procedure: ESOPHAGEAL MANOMETRY (EM);  Surgeon: Karen Pole, MD;  Location: [facility];   Service: Endoscopy;  Laterality: N/A;  . EYE SURGERY Right    cataract extraction with IOL  . HYSTEROSCOPY  11/2004   Cervical hystogram   . INSERTION OF MESH N/A 06/18/2016   Procedure: INSERTION OF MESH;  Surgeon: Michael Boston, MD;  Location: [facility];  Service: General;  Laterality: N/A;  . NISSEN FUNDOPLICATION  36/64/4034  . POLYPECTOMY  08/11/2017   Procedure: POLYPECTOMY;  Surgeon: Karen Houston, MD;  Location: [facility];  Service: Endoscopy;;  colon    Family History  Problem Relation Age of Onset  . Cancer Mother        bladder  . Other Father        had bladder issues   Allergies as of 10/23/2018      Reactions   Ciprofloxacin Nausea And Vomiting   Codeine Nausea And Vomiting, Other (See Comments)   Feels funny   Celebrex [celecoxib] Rash   Penicillins Rash   Has patient had a PCN reaction causing immediate rash, facial/tongue/throat swelling, SOB or lightheadedness with hypotension: Rash Has patient had a PCN reaction causing severe rash involving mucus membranes or skin necrosis: No Has patient had a PCN reaction that required hospitalization No Has patient had a PCN reaction occurring within the last 10 years: NO If all of the above answers are "NO", then may proceed with Cephalosporin use.      Medication List       Accurate as of October 23, 2018 11:15 AM. Always use your most recent med list.        albuterol 108 (90 Base) MCG/ACT inhaler Commonly known as:  VENTOLIN HFA Inhale 1-2 puffs into the lungs every 4 (four) hours as needed for wheezing or shortness of breath.   aspirin EC 81 MG tablet Take 1 tablet (81 mg total) by mouth. Every other day   cyanocobalamin 1000 MCG/ML injection Commonly known as:  (VITAMIN B-12) Inject 1 mL (1,000 mcg total) into the muscle once.   DULoxetine 60 MG capsule Commonly known as:  CYMBALTA Take 1 capsule (60 mg total) by mouth daily.   fexofenadine 60 MG tablet Commonly known as:  ALLEGRA Take 1 tablet  (60 mg total) by mouth 2 (two) times daily.   metroNIDAZOLE 0.75 % vaginal gel Commonly known as:  METROGEL VAGINAL Nightly x 5 nights   pantoprazole 40 MG tablet Commonly known as:  PROTONIX 1 tab po qd prn acid reflux   promethazine 25 MG tablet Commonly known as:  PHENERGAN 1/2-1 tab po q6h prn nausea/vomiting       All past medical history, surgical history, allergies, family history, immunizations andmedications were updated in the EMR today and reviewed under the history and medication portions of their EMR.     ROS: Negative, with the exception of above mentioned in HPI   Objective:  BP 132/81 (BP Location: Right Arm, Patient Position: Sitting, Cuff Size:  Normal)   Pulse 75   Temp 97.9 F (36.6 C) (Oral)   Resp 16   Ht 5' 7.5" (1.715 m)   Wt 150 lb (68 kg)   SpO2 97%   BMI 23.15 kg/m  Body mass index is 23.15 kg/m. Gen: Afebrile. No acute distress. Nontoxic in appearance, well developed, well nourished.  HENT: AT. [place].  MMM Eyes:Pupils Equal Round Reactive to light, Extraocular movements intact,  Conjunctiva without redness, discharge or icterus. Skin: 2.0 x 2.5 cm intact blister left 1st large toe.  No purpura or petechiae.  Neuro: Normal gait. PERLA. EOMi. Alert. Oriented x3  No exam data present No results found. No results found for this or any previous visit (from the past 24 hour(s)).  Assessment/Plan: Karen Delacruz is a 77 y.o. female present for OV for  Second degree burn of foot, left, initial encounter Keep clean daily, no standing water. Keep elevated. Apply silvadene cream daily with clean q-tip to blister and surounding area.  - Tylenol for pain  - If/when blister ruptures--> keep covered with silvadene, telfa and kling (samples given today). Pt was instructed to followup in 1 week for recheck, encouraged her to keep blister intact as long as possible to allow healing without bacteria exposure. She was instructed to monitor for any signs of  increasing pain, redness, fever or pus-like drainage.    Reviewed expectations re: course of current medical issues.  Discussed self-management of symptoms.  Outlined signs and symptoms indicating need for more acute intervention.  Patient verbalized understanding and all questions were answered.  Patient received an After-Visit Summary.   No orders of the defined types were placed in this encounter.   Note is dictated utilizing voice recognition software. Although note has been proof read prior to signing, occasional typographical errors still can be missed. If any questions arise, please do not hesitate to call for verification.   electronically signed by:  Howard Pouch, DO  [facility]

## 2018-10-31 ENCOUNTER — Ambulatory Visit: Payer: Medicare Other | Admitting: Family Medicine

## 2018-10-31 ENCOUNTER — Encounter: Payer: Self-pay | Admitting: Family Medicine

## 2018-10-31 VITALS — BP 120/74 | HR 72 | Temp 97.2°F | Resp 16 | Ht 67.5 in | Wt 150.4 lb

## 2018-10-31 DIAGNOSIS — T25231D Burn of second degree of right toe(s) (nail), subsequent encounter: Secondary | ICD-10-CM

## 2018-10-31 NOTE — Progress Notes (Signed)
OFFICE VISIT  10/31/2018   CC:  Chief Complaint  Patient presents with  . Follow-up    blister on right foot     HPI:    Patient is a 78 y.o. Caucasian female who presents for 1 week f/u of 2nd degree burn of right great toe. Silvadene cream rx'd at that time.  Was told to f/u with me now.  She still has an intact blister on top of right foot great toe on prox phalanx.  The burn (grease) occurred 2 wks ago. When up on her feet for a while it throbs.  No pain at rest and with wt off of it.  She has had no fever or malaise.  She has applied silvadene faithfully.   No new problems.   Past Medical History:  Diagnosis Date  . Abnormal EKG 05/2016   LAFB, voltage criteria for LVH in lead aVL.  Marland Kitchen Anxiety   . Cataract    right  . Colon cancer screening 06/2017   Cologuard positive--colonoscopy showed adenomatous polyp with high grade dysplasia--recall 3 yrs.  . Cystocele, unspecified (CODE) 2018   Pessary fitted at Dr. Brynda [facility]:  Milex ring with support #3--great improvement.  . Degenerative arthritis of cervical spine   . Fatty liver   . Fibromyalgia   . GERD (gastroesophageal reflux disease)   . History of hiatal hernia   . Hx: UTI (urinary tract infection)    recurrent.  Pyelo hospitalization 2012  . Hypertension   . Idiopathic urticaria 02/2018  . Microhematuria 02/2012   Cystoscopy with bladder biopsy and cystouretograms--pathology benign.  . Myalgia    Hx of diffuse myalgias, onset in her 100s  . Osteoporosis 08/10/2016   T-score -2.5: started fosamax 07/2016.  Intol fosamax; started prolia 08/27/2016.  Marland Kitchen Periesophageal hiatal hernia 02/2016   Type 4  . Thickened endometrium 10/16/2011   on CT.  F/u pelvic u/s showed nabothian cyst to account for this..  No enometrial pathology noted on u/s, no ovaries visualized.  . Vertigo   . Vitamin B12 deficiency 11/2015   + LL PN    Past Surgical History:  Procedure Laterality Date  . BACK SURGERY  1990s x 2   Dr. Leeanne Deed in  [place]  . CHOLECYSTECTOMY    . COLONOSCOPY  approx 2002; 08/11/17   2002; Diverticulosis, int hemorrhoids ([facility]).  2018 (for + cologuard)--adenomatous polyps with high grade dysplasia (recall 3 yrs), also diverticulosis.  . COLONOSCOPY N/A 08/11/2017   Procedure: COLONOSCOPY;  Surgeon: Rogene Houston, MD;  [facility];  Service: Endoscopy;  Laterality: N/A;  7:30  . ESOPHAGEAL MANOMETRY N/A 04/19/2016   Normal.  Procedure: ESOPHAGEAL MANOMETRY (EM);  Surgeon: Mauri Pole, MD;  [facility]: [facility];  Service: Endoscopy;  Laterality: N/A;  . EYE SURGERY Right    cataract extraction with IOL  . HYSTEROSCOPY  11/2004   Cervical hystogram   . INSERTION OF MESH N/A 06/18/2016   Procedure: INSERTION OF MESH;  Surgeon: Michael Boston, MD;  Location: [facility];  Service: General;  Laterality: N/A;  . NISSEN FUNDOPLICATION  76/28/3151  . POLYPECTOMY  08/11/2017   Procedure: POLYPECTOMY;  Surgeon: Rogene Houston, MD;  [facility];  Service: Endoscopy;;  colon     Outpatient Medications Prior to Visit  Medication Sig Dispense Refill  . albuterol (VENTOLIN HFA) 108 (90 BASE) MCG/ACT inhaler Inhale 1-2 puffs into the lungs every 4 (four) hours as needed for wheezing or shortness of breath.  1 Inhaler 0  . aspirin EC 81 MG tablet Take 1 tablet (81 mg total) by mouth. Every other day    . cyanocobalamin (,VITAMIN B-12,) 1000 MCG/ML injection Inject 1 mL (1,000 mcg total) into the muscle once. (Patient taking differently: Inject 1,000 mcg into the muscle every 30 (thirty) days. ) 1 mL 0  . DULoxetine (CYMBALTA) 60 MG capsule Take 1 capsule (60 mg total) by mouth daily. 30 capsule 3  . fexofenadine (ALLEGRA) 60 MG tablet Take 1 tablet (60 mg total) by mouth 2 (two) times daily. 60 tablet 1  . metroNIDAZOLE (METROGEL VAGINAL) 0.75 % vaginal gel Nightly x 5 nights 70 g 0  . pantoprazole (PROTONIX) 40 MG tablet 1 tab po qd prn acid reflux (Patient taking differently: Take 40  mg by mouth daily as needed. ) 30 tablet 3  . promethazine (PHENERGAN) 25 MG tablet 1/2-1 tab po q6h prn nausea/vomiting 30 tablet 0  . silver sulfADIAZINE (SILVADENE) 1 % cream Apply 1 application topically daily. 50 g 0   Facility-Administered Medications Prior to Visit  Medication Dose Route Frequency Provider Last Rate Last Dose  . cyanocobalamin ((VITAMIN B-12)) injection 1,000 mcg  1,000 mcg Intramuscular Q30 days Tammi Sou, MD   1,000 mcg at 09/18/18 0930    Allergies  Allergen Reactions  . Ciprofloxacin Nausea And Vomiting  . Codeine Nausea And Vomiting and Other (See Comments)    Feels funny  . Celebrex [Celecoxib] Rash  . Penicillins Rash    Has patient had a PCN reaction causing immediate rash, facial/tongue/throat swelling, SOB or lightheadedness with hypotension: Rash Has patient had a PCN reaction causing severe rash involving mucus membranes or skin necrosis: No Has patient had a PCN reaction that required hospitalization No Has patient had a PCN reaction occurring within the last 10 years: NO If all of the above answers are "NO", then may proceed with Cephalosporin use.    ROS As per HPI  PE: Blood pressure 120/74, pulse 72, temperature (!) 97.2 F (36.2 C), temperature source Oral, resp. rate 16, height 5' 7.5" (1.715 m), weight 150 lb 6 oz (68.2 kg), SpO2 98 %. Gen: Alert, well appearing.  Patient is oriented to person, place, time, and situation. AFFECT: pleasant, lucid thought and speech. Right foot great toe dorsal aspect (on prox phalanx) there is a 2 cm diameter soft blister that is a bit translucent.  Clear fluid in the blister.  No drainage currently.  No erythema or tenderness or foul odor.    LABS:    Chemistry      Component Value Date/Time   NA 138 08/29/2017 0958   K 4.1 08/29/2017 0958   CL 100 08/29/2017 0958   CO2 31 08/29/2017 0958   BUN 11 08/29/2017 0958   CREATININE 0.62 08/29/2017 0958      Component Value Date/Time   CALCIUM  9.8 08/29/2017 0958   ALKPHOS 60 08/29/2017 0958   AST 15 08/29/2017 0958   ALT 8 08/29/2017 0958   BILITOT 0.9 08/29/2017 0958      IMPRESSION AND PLAN:  Right great toe 2nd degree burn. Doing fine. Discussed ongoing wound care.  Questions answered. Signs/symptoms to call or return for were reviewed and pt expressed understanding.   An After Visit Summary was printed and given to the patient.  FOLLOW UP: Return for as needed.  Signed:  Crissie Sickles, MD           10/31/2018

## 2018-11-09 ENCOUNTER — Encounter: Payer: Self-pay | Admitting: Family Medicine

## 2018-11-09 ENCOUNTER — Ambulatory Visit (INDEPENDENT_AMBULATORY_CARE_PROVIDER_SITE_OTHER): Payer: Medicare Other | Admitting: Family Medicine

## 2018-11-09 VITALS — BP 136/83 | HR 63 | Temp 98.1°F | Resp 16 | Ht 66.5 in | Wt 148.4 lb

## 2018-11-09 DIAGNOSIS — M810 Age-related osteoporosis without current pathological fracture: Secondary | ICD-10-CM

## 2018-11-09 DIAGNOSIS — Z1239 Encounter for other screening for malignant neoplasm of breast: Secondary | ICD-10-CM

## 2018-11-09 DIAGNOSIS — R202 Paresthesia of skin: Secondary | ICD-10-CM

## 2018-11-09 DIAGNOSIS — E2839 Other primary ovarian failure: Secondary | ICD-10-CM

## 2018-11-09 DIAGNOSIS — Z0000 Encounter for general adult medical examination without abnormal findings: Secondary | ICD-10-CM | POA: Diagnosis not present

## 2018-11-09 LAB — COMPREHENSIVE METABOLIC PANEL
ALT: 9 U/L (ref 0–35)
AST: 16 U/L (ref 0–37)
Albumin: 4.1 g/dL (ref 3.5–5.2)
Alkaline Phosphatase: 40 U/L (ref 39–117)
BUN: 8 mg/dL (ref 6–23)
CO2: 32 mEq/L (ref 19–32)
Calcium: 9 mg/dL (ref 8.4–10.5)
Chloride: 104 mEq/L (ref 96–112)
Creatinine, Ser: 0.69 mg/dL (ref 0.40–1.20)
GFR: 82.54 mL/min (ref >60.00)
GLUCOSE: 89 mg/dL (ref 70–99)
Potassium: 3.6 mEq/L (ref 3.5–5.1)
Sodium: 142 mEq/L (ref 135–145)
Total Bilirubin: 0.7 mg/dL (ref 0.2–1.2)
Total Protein: 6.8 g/dL (ref 6.0–8.3)

## 2018-11-09 LAB — CBC WITH DIFFERENTIAL/PLATELET
BASOS PCT: 1.3 % (ref 0.0–3.0)
Basophils Absolute: 0.1 10*3/uL (ref 0.0–0.1)
EOS PCT: 4.6 % (ref 0.0–5.0)
Eosinophils Absolute: 0.3 10*3/uL (ref 0.0–0.7)
HCT: 39.1 % (ref 36.0–46.0)
Hemoglobin: 13.4 g/dL (ref 12.0–15.0)
Lymphocytes Relative: 38.9 % (ref 12.0–46.0)
Lymphs Abs: 2.4 10*3/uL (ref 0.7–4.0)
MCHC: 34.2 g/dL (ref 30.0–36.0)
MCV: 94.5 fl (ref 78.0–100.0)
Monocytes Absolute: 0.8 10*3/uL (ref 0.1–1.0)
Monocytes Relative: 12 % (ref 3.0–12.0)
Neutro Abs: 2.7 10*3/uL (ref 1.4–7.7)
Neutrophils Relative %: 43.2 % (ref 43.0–77.0)
Platelets: 214 10*3/uL (ref 150.0–400.0)
RBC: 4.14 Mil/uL (ref 3.87–5.11)
RDW: 13.2 % (ref 11.5–15.5)
WBC: 6.3 10*3/uL (ref 4.0–10.5)

## 2018-11-09 LAB — LIPID PANEL
Cholesterol: 183 mg/dL (ref 0–200)
HDL: 58.7 mg/dL (ref >39.00)
LDL Cholesterol: 100 mg/dL — ABNORMAL HIGH (ref 0–99)
NonHDL: 124.79
Total CHOL/HDL Ratio: 3
Triglycerides: 125 mg/dL (ref 0.0–149.0)
VLDL: 25 mg/dL (ref 0.0–40.0)

## 2018-11-09 NOTE — Progress Notes (Signed)
Office Note 11/09/2018  CC:  Chief Complaint  Patient presents with  . Annual Exam    Pt is fasting.     HPI:  Karen Delacruz is a 78 y.o. White female who is here for annual health maintenance exam.  Has about 2 yr hx of paresthesias/numbness in bilat LL's, without pain or weakness.  Seems to be going further up legs over time--->now is up to knee level. No pale or blue discoloration of LLs/feet.  Sx's are same when seated as when walking. Some RLS sx's but nothing prominent.  Occ she has to stand still for a few seconds when she stands in order to feel like she can start moving her legs and walking--says the numbness sensation is briefly worse at these times. We diagnosed her with vit B12 def at the beginning of these sx's, but she has been on B12 injections regularly and B12 has come back into normal range and her sx's have continued to gradually progress.   Past Medical History:  Diagnosis Date  . Abnormal EKG 05/2016   LAFB, voltage criteria for LVH in lead aVL.  Marland Kitchen Anxiety   . Cataract    right  . Colon cancer screening 06/2017   Cologuard positive--colonoscopy showed adenomatous polyp with high grade dysplasia--recall 3 yrs.  . Cystocele, unspecified (CODE) 2018   Pessary fitted at Dr. Brynda Greathouse:  Milex ring with support #3--great improvement.  . Degenerative arthritis of cervical spine   . Fatty liver   . Fibromyalgia   . GERD (gastroesophageal reflux disease)   . History of hiatal hernia   . Hx: UTI (urinary tract infection)    recurrent.  Pyelo hospitalization 2012  . Hypertension   . Idiopathic urticaria 02/2018  . Microhematuria 02/2012   Cystoscopy with bladder biopsy and cystouretograms--pathology benign.  . Myalgia    Hx of diffuse myalgias, onset in her 87s  . Osteoporosis 08/10/2016   T-score -2.5: started fosamax 07/2016.  Intol fosamax; started prolia 08/27/2016.  Marland Kitchen Periesophageal hiatal hernia 02/2016   Type 4  . Thickened endometrium 10/16/2011   on  CT.  F/u pelvic u/s showed nabothian cyst to account for this..  No enometrial pathology noted on u/s, no ovaries visualized.  . Vertigo   . Vitamin B12 deficiency 11/2015   + LL PN    Past Surgical History:  Procedure Laterality Date  . BACK SURGERY  1990s x 2   Dr. Leeanne Deed in [place]  . CHOLECYSTECTOMY    . COLONOSCOPY  approx 2002; 08/11/17   2002; Diverticulosis, int hemorrhoids ([facility]).  2018 (for + cologuard)--adenomatous polyps with high grade dysplasia (recall 3 yrs), also diverticulosis.  . COLONOSCOPY N/A 08/11/2017   Procedure: COLONOSCOPY;  Surgeon: Rogene Houston, MD;  [facility];  Service: Endoscopy;  Laterality: N/A;  7:30  . ESOPHAGEAL MANOMETRY N/A 04/19/2016   Normal.  Procedure: ESOPHAGEAL MANOMETRY (EM);  Surgeon: Mauri Pole, MD;  Location: [facility];  Service: Endoscopy;  Laterality: N/A;  . EYE SURGERY Right    cataract extraction with IOL  . HYSTEROSCOPY  11/2004   Cervical hystogram   . INSERTION OF MESH N/A 06/18/2016   Procedure: INSERTION OF MESH;  Surgeon: Michael Boston, MD;  Location: [facility];  Service: General;  Laterality: N/A;  . NISSEN FUNDOPLICATION  08/21/2535  . POLYPECTOMY  08/11/2017   Procedure: POLYPECTOMY;  Surgeon: Rogene Houston, MD;  [facility]: [facility];  Service: Endoscopy;;  colon  Family History  Problem Relation Age of Onset  . Cancer Mother        bladder  . Other Father        had bladder issues    Social History   Socioeconomic History  . Marital status: Married    Spouse name: Not on file  . Number of children: Not on file  . Years of education: Not on file  . Highest education level: Not on file  Occupational History  . Not on file  Social Needs  . Financial resource strain: Not on file  . Food insecurity:    Worry: Not on file    Inability: Not on file  . Transportation needs:    Medical: Not on file    Non-medical: Not on file  Tobacco Use  . Smoking status: Never Smoker  .  Smokeless tobacco: Never Used  Substance and Sexual Activity  . Alcohol use: No  . Drug use: No  . Sexual activity: Yes    Birth control/protection: Post-menopausal  Lifestyle  . Physical activity:    Days per week: Not on file    Minutes per session: Not on file  . Stress: Not on file  Relationships  . Social connections:    Talks on phone: Not on file    Gets together: Not on file    Attends religious service: Not on file    Active member of club or organization: Not on file    Attends meetings of clubs or organizations: Not on file    Relationship status: Not on file  . Intimate partner violence:    Fear of current or ex partner: Not on file    Emotionally abused: Not on file    Physically abused: Not on file    Forced sexual activity: Not on file  Other Topics Concern  . Not on file  Social History Narrative   Married, lives with husband in [place].  Has two grown children.   Retired from [employer], then un-retired to go back to same job--full time.   No t/a/d.   No formal exercise.   Normal american diet.    Outpatient Medications Prior to Visit  Medication Sig Dispense Refill  . albuterol (VENTOLIN HFA) 108 (90 BASE) MCG/ACT inhaler Inhale 1-2 puffs into the lungs every 4 (four) hours as needed for wheezing or shortness of breath. 1 Inhaler 0  . aspirin EC 81 MG tablet Take 1 tablet (81 mg total) by mouth. Every other day    . cyanocobalamin (,VITAMIN B-12,) 1000 MCG/ML injection Inject 1 mL (1,000 mcg total) into the muscle once. (Patient taking differently: Inject 1,000 mcg into the muscle every 30 (thirty) days. ) 1 mL 0  . DULoxetine (CYMBALTA) 60 MG capsule Take 1 capsule (60 mg total) by mouth daily. 30 capsule 3  . fexofenadine (ALLEGRA) 60 MG tablet Take 1 tablet (60 mg total) by mouth 2 (two) times daily. 60 tablet 1  . metroNIDAZOLE (METROGEL VAGINAL) 0.75 % vaginal gel Nightly x 5 nights 70 g 0  . pantoprazole (PROTONIX) 40 MG tablet 1 tab po qd  prn acid reflux (Patient taking differently: Take 40 mg by mouth daily as needed. ) 30 tablet 3  . promethazine (PHENERGAN) 25 MG tablet 1/2-1 tab po q6h prn nausea/vomiting 30 tablet 0  . silver sulfADIAZINE (SILVADENE) 1 % cream Apply 1 application topically daily. 50 g 0   Facility-Administered Medications Prior to Visit  Medication Dose Route Frequency Provider Last  Rate Last Dose  . cyanocobalamin ((VITAMIN B-12)) injection 1,000 mcg  1,000 mcg Intramuscular Q30 days Tammi Sou, MD   1,000 mcg at 09/18/18 0930    Allergies  Allergen Reactions  . Ciprofloxacin Nausea And Vomiting  . Codeine Nausea And Vomiting and Other (See Comments)    Feels funny  . Celebrex [Celecoxib] Rash  . Penicillins Rash    Has patient had a PCN reaction causing immediate rash, facial/tongue/throat swelling, SOB or lightheadedness with hypotension: Rash Has patient had a PCN reaction causing severe rash involving mucus membranes or skin necrosis: No Has patient had a PCN reaction that required hospitalization No Has patient had a PCN reaction occurring within the last 10 years: NO If all of the above answers are "NO", then may proceed with Cephalosporin use.    ROS Review of Systems  Constitutional: Negative for appetite change, chills, fatigue and fever.  HENT: Negative for congestion, dental problem, ear pain and sore throat.   Eyes: Negative for discharge, redness and visual disturbance.  Respiratory: Negative for cough, chest tightness, shortness of breath and wheezing.   Cardiovascular: Negative for chest pain, palpitations and leg swelling.  Gastrointestinal: Negative for abdominal pain, blood in stool, diarrhea, nausea and vomiting.  Genitourinary: Negative for difficulty urinating, dysuria, flank pain, frequency, hematuria and urgency.  Musculoskeletal: Negative for arthralgias, back pain, joint swelling, myalgias and neck stiffness.  Skin: Negative for pallor and rash.  Neurological:  Positive for numbness (bilat LLs from knees into toes--see HPI). Negative for dizziness, speech difficulty, weakness and headaches.  Hematological: Negative for adenopathy. Does not bruise/bleed easily.  Psychiatric/Behavioral: Negative for confusion and sleep disturbance. The patient is not nervous/anxious.     PE; Blood pressure 136/83, pulse 63, temperature 98.1 F (36.7 C), temperature source Oral, resp. rate 16, height 5' 6.5" (1.689 m), weight 148 lb 6 oz (67.3 kg), SpO2 96 %. Body mass index is 23.59 kg/m.  Exam chaperoned by Caroll Rancher, LPN. Gen: Alert, well appearing.  Patient is oriented to person, place, time, and situation. AFFECT: pleasant, lucid thought and speech. ENT: Ears: EACs clear, normal epithelium.  TMs with good light reflex and landmarks bilaterally.  Eyes: no injection, icteris, swelling, or exudate.  EOMI, PERRLA. Nose: no drainage or turbinate edema/swelling.  No injection or focal lesion.  Mouth: lips without lesion/swelling.  Oral mucosa pink and moist.  Dentition intact and without obvious caries or gingival swelling.  Oropharynx without erythema, exudate, or swelling.  Neck: supple/nontender.  No LAD, mass, or TM.  Carotid pulses 2+ bilaterally, without bruits. CV: RRR, no m/r/g.   LUNGS: CTA bilat, nonlabored resps, good aeration in all lung fields. ABD: soft, NT, ND, BS normal.  No hepatospenomegaly or mass.  No bruits. EXT: no clubbing, cyanosis, or edema.  DP and PT pulses easily palpable bilat. Musculoskeletal: no joint swelling, erythema, warmth, or tenderness.  ROM of all joints intact. Skin - no sores or suspicious lesions or rashes or color changes Neuro: CN 2-12 intact bilaterally, strength 5/5 in proximal and distal upper extremities and lower extremities bilaterally.  Decreased sensation over both LL's from knees into all toes in stocking-like distribution.  No tremor. No ataxia.   Cannot elicit R patellar DTR, and she has 1+ L patellar DTR.   Cannot elicit DTR at either achilles.  Pertinent labs:   Lab Results  Component Value Date   VITAMINB12 460 02/01/2017    Lab Results  Component Value Date   TSH 2.17 03/15/2017  Lab Results  Component Value Date   WBC 6.2 08/29/2017   HGB 13.7 08/29/2017   HCT 41.0 08/29/2017   MCV 94.6 08/29/2017   PLT 245.0 08/29/2017   Lab Results  Component Value Date   CREATININE 0.62 08/29/2017   BUN 11 08/29/2017   NA 138 08/29/2017   K 4.1 08/29/2017   CL 100 08/29/2017   CO2 31 08/29/2017   Lab Results  Component Value Date   ALT 8 08/29/2017   AST 15 08/29/2017   ALKPHOS 60 08/29/2017   BILITOT 0.9 08/29/2017   Lab Results  Component Value Date   CHOL 175 10/28/2017   Lab Results  Component Value Date   HDL 60.60 10/28/2017   Lab Results  Component Value Date   LDLCALC 98 10/28/2017   Lab Results  Component Value Date   TRIG 81.0 10/28/2017   Lab Results  Component Value Date   CHOLHDL 3 10/28/2017   Lab Results  Component Value Date   HGBA1C 5.7 12/19/2015    ASSESSMENT AND PLAN:   1) Paresthesias/numbness in both LL's.  This is chronic and gradually progressing in severity and distribution. Treatment for vit B12 deficiency has not had any effect on the symptoms. No sign of peripheral vasc dz. Will ask neurology to see.  2) Health maintenance exam: Reviewed age and gender appropriate health maintenance issues (prudent diet, regular exercise, health risks of tobacco and excessive alcohol, use of seatbelts, fire alarms in home, use of sunscreen).  Also reviewed age and gender appropriate health screening as well as vaccine recommendations. Vaccines: all UTD, including shingrix. Labs: CBC, CMET, FLP. Cervical ca screening: per GYN, Dr. Elonda Husky.  Pt returns to see him 12/2018. Osteoporosis: last DEXA was a little over 2 yrs ago.  She gets prolia q6 mo. Breast ca screening: most recent mammogram in EMR was 2018.  2019 order for mammo has expired-->ordered  today. Colon ca screening: next colonoscopy due 07/2020.  An After Visit Summary was printed and given to the patient.  FOLLOW UP:  Return in about 6 months (around 05/10/2019) for routine chronic illness f/u.  Signed:  Crissie Sickles, MD           11/09/2018

## 2018-11-09 NOTE — Patient Instructions (Signed)

## 2018-11-15 ENCOUNTER — Encounter: Payer: Self-pay | Admitting: Neurology

## 2018-11-20 ENCOUNTER — Ambulatory Visit (INDEPENDENT_AMBULATORY_CARE_PROVIDER_SITE_OTHER): Payer: Medicare Other | Admitting: Family Medicine

## 2018-11-20 ENCOUNTER — Ambulatory Visit: Payer: Medicare Other

## 2018-11-20 DIAGNOSIS — E538 Deficiency of other specified B group vitamins: Secondary | ICD-10-CM | POA: Diagnosis not present

## 2018-11-20 NOTE — Progress Notes (Signed)
Patient presented to office today for monthly Vitamin B12 injection, tolerated well.  

## 2018-11-20 NOTE — Progress Notes (Signed)
Pt with vit B12 deficiency.  Agree with vit B12 1000 mcg IM in office today. Signed:  Crissie Sickles, MD           11/20/2018

## 2018-12-06 ENCOUNTER — Ambulatory Visit (HOSPITAL_COMMUNITY)
Admission: RE | Admit: 2018-12-06 | Discharge: 2018-12-06 | Disposition: A | Discharge status: Home / Self Care | Payer: Medicare Other | Source: Ambulatory Visit | Attending: Family Medicine | Admitting: Family Medicine

## 2018-12-06 DIAGNOSIS — M810 Age-related osteoporosis without current pathological fracture: Secondary | ICD-10-CM

## 2018-12-06 DIAGNOSIS — E2839 Other primary ovarian failure: Secondary | ICD-10-CM | POA: Diagnosis present

## 2018-12-06 DIAGNOSIS — Z1231 Encounter for screening mammogram for malignant neoplasm of breast: Secondary | ICD-10-CM | POA: Diagnosis not present

## 2018-12-06 DIAGNOSIS — Z1239 Encounter for other screening for malignant neoplasm of breast: Secondary | ICD-10-CM

## 2018-12-06 HISTORY — PX: OTHER SURGICAL HISTORY: SHX169

## 2018-12-07 ENCOUNTER — Encounter: Payer: Self-pay | Admitting: Family Medicine

## 2018-12-08 ENCOUNTER — Encounter: Payer: Self-pay | Admitting: Family Medicine

## 2018-12-11 ENCOUNTER — Encounter: Payer: Self-pay | Admitting: *Deleted

## 2018-12-18 ENCOUNTER — Ambulatory Visit (INDEPENDENT_AMBULATORY_CARE_PROVIDER_SITE_OTHER): Payer: Medicare Other

## 2018-12-18 DIAGNOSIS — E538 Deficiency of other specified B group vitamins: Secondary | ICD-10-CM | POA: Diagnosis not present

## 2018-12-18 MED ORDER — CYANOCOBALAMIN 1000 MCG/ML IJ SOLN
1000.0000 ug | Freq: Once | INTRAMUSCULAR | Status: AC
  Administered 2018-12-18: 1000 ug via INTRAMUSCULAR

## 2018-12-18 NOTE — Progress Notes (Signed)
Karen Delacruz is a 78 y.o. female presents to the office today for monthly B12 injection, per physician's orders. B12 103mcg/ml administered IM in left deltoid. Patient tolerated injection. Next injection scheduled for 01/17/2019.     Gerilyn Nestle

## 2018-12-27 NOTE — Progress Notes (Signed)
Pt with vit B12 deficiency.  Agree with vit B12 1000 mcg IM in office today., Signed:  Crissie Sickles, MD           12/27/2018

## 2019-01-08 ENCOUNTER — Telehealth: Payer: Self-pay | Admitting: Obstetrics & Gynecology

## 2019-01-08 NOTE — Telephone Encounter (Signed)
Tell her to wait 1 month and yes it will be fine

## 2019-01-08 NOTE — Telephone Encounter (Signed)
Patient called stating that she really doesn't want to come to her appointment do to the Virus and would like to know if she could wait for 2 weeks to keep her pessary in without cleaning, Pt states that if she has to she will come but she really does not. Please contact pt

## 2019-01-11 ENCOUNTER — Ambulatory Visit: Payer: Medicare Other | Admitting: Obstetrics & Gynecology

## 2019-01-12 ENCOUNTER — Ambulatory Visit: Payer: Self-pay | Admitting: Obstetrics & Gynecology

## 2019-01-17 ENCOUNTER — Ambulatory Visit: Payer: Medicare Other

## 2019-01-18 ENCOUNTER — Telehealth: Payer: Self-pay | Admitting: Obstetrics & Gynecology

## 2019-01-18 NOTE — Telephone Encounter (Signed)
noted 

## 2019-01-18 NOTE — Telephone Encounter (Signed)
Patient called and cancelled her appointment for Monday for pessary.  She didn't want to reschedule, she stated she took it out herself, washed it and put it back in and was doing great.  This message is for FYI.  [phone]

## 2019-01-22 ENCOUNTER — Ambulatory Visit: Payer: Medicare Other | Admitting: Obstetrics & Gynecology

## 2019-01-22 ENCOUNTER — Ambulatory Visit: Payer: Medicare Other | Admitting: Neurology

## 2019-01-24 ENCOUNTER — Ambulatory Visit: Payer: Medicare Other | Admitting: Neurology

## 2019-02-05 ENCOUNTER — Ambulatory Visit: Payer: Medicare Other | Admitting: Neurology

## 2019-02-05 NOTE — Progress Notes (Signed)
New Patient Virtual Visit via Video Note The purpose of this virtual visit is to provide medical care while limiting exposure to the novel coronavirus.    Consent was obtained for video visit:  Yes.   Answered questions that patient had about telehealth interaction:  Yes.   I discussed the limitations, risks, security and privacy concerns of performing an evaluation and management service by telemedicine. I also discussed with the patient that there may be a patient responsible charge related to this service. The patient expressed understanding and agreed to proceed.  Pt location: Home Physician Location: office Name of referring provider:  Tammi Sou, MD I connected with [name] at patients initiation/request on 02/06/2019 at  2:00 PM EDT by video enabled telemedicine application and verified that I am speaking with the correct person using two identifiers. Pt MRN:  [id number] Pt DOB:  July 21, 1941 Video Participants:  [name]    History of Present Illness: Karen Delacruz is a 78 y.o. left-handed Caucasian female with GERD and depression presenting for evaluation of bilateral leg numbness/tingling.   Starting around 2015, she began experiencing tingling and numbness in the foot which has progressed to the level of the knees.  During this time, she was also noted to have vitamin B12 deficiency and began injections, but did not appreciate any change in her numbness/tingling.  She has a sensation that her legs are asleep and sometimes upon standing, she is cautious before walking because she cannot always tell where her foot is.  Symptoms are always worse at night time and often cause sleep disturbance.   She does not have numbness/tingling in the hands, low back pain, or leg weakness.  Balance is good and she walks unassisted.  No recent falls.   She live with husband and has two children.  Retired from working in various roles in [employer].    No history of diabetes,  thyroid problems, or family history of neuropathy.   Out-side paper records, electronic medical record, and images have been reviewed where available and summarized as:  Lab Results  Component Value Date   HGBA1C 5.7 12/19/2015   Lab Results  Component Value Date   OZHYQMVH84 696 02/01/2017   Lab Results  Component Value Date   TSH 2.17 03/15/2017   Lab Results  Component Value Date   ESRSEDRATE 9 03/15/2017    Past Medical History:  Diagnosis Date  . Abnormal EKG 05/2016   LAFB, voltage criteria for LVH in lead aVL.  Marland Kitchen Anxiety   . Cataract    right  . Colon cancer screening 06/2017   Cologuard positive--colonoscopy showed adenomatous polyp with high grade dysplasia--recall 3 yrs.  . Cystocele, unspecified (CODE) 2018   Pessary fitted at Dr. Brynda Greathouse:  Milex ring with support #3--great improvement.  . Degenerative arthritis of cervical spine   . Fatty liver   . Fibromyalgia   . GERD (gastroesophageal reflux disease)   . History of hiatal hernia   . Hx: UTI (urinary tract infection)    recurrent.  Pyelo hospitalization 2012  . Hypertension   . Idiopathic urticaria 02/2018  . Microhematuria 02/2012   Cystoscopy with bladder biopsy and cystouretograms--pathology benign.  . Myalgia    Hx of diffuse myalgias, onset in her 64s  . Osteoporosis 08/10/2016   T-score -2.5: started fosamax 07/2016.  Intol fosamax; started prolia 08/27/2016, most recent prolia was 08/30/2018.  Marland Kitchen Periesophageal hiatal hernia 02/2016   Type 4  . Thickened endometrium  10/16/2011   on CT.  F/u pelvic u/s showed nabothian cyst to account for this..  No enometrial pathology noted on u/s, no ovaries visualized.  . Vertigo   . Vitamin B12 deficiency 11/2015   + LL PN    Past Surgical History:  Procedure Laterality Date  . BACK SURGERY  1990s x 2   Dr. Leeanne Deed in [place]  . CHOLECYSTECTOMY    . COLONOSCOPY  approx 2002; 08/11/17   2002; Diverticulosis, int hemorrhoids ([facility]).  2018 (for +  cologuard)--adenomatous polyps with high grade dysplasia (recall 3 yrs), also diverticulosis.  . COLONOSCOPY N/A 08/11/2017   Procedure: COLONOSCOPY;  Surgeon: Rogene Houston, MD;  [facility];  Service: Endoscopy;  Laterality: N/A;  7:30  . ESOPHAGEAL MANOMETRY N/A 04/19/2016   Normal.  Procedure: ESOPHAGEAL MANOMETRY (EM);  Surgeon: Mauri Pole, MD;  [facility];  Service: Endoscopy;  Laterality: N/A;  . EYE SURGERY Right    cataract extraction with IOL  . HYSTEROSCOPY  11/2004   Cervical hystogram   . INSERTION OF MESH N/A 06/18/2016   Procedure: INSERTION OF MESH;  Surgeon: Michael Boston, MD;  [facility]: [facility];  Service: General;  Laterality: N/A;  . NISSEN FUNDOPLICATION  13/24/4010  . POLYPECTOMY  08/11/2017   Procedure: POLYPECTOMY;  Surgeon: Rogene Houston, MD;  [facility];  Service: Endoscopy;;  colon      Medications:  Outpatient Encounter Medications as of 02/06/2019  Medication Sig  . albuterol (VENTOLIN HFA) 108 (90 BASE) MCG/ACT inhaler Inhale 1-2 puffs into the lungs every 4 (four) hours as needed for wheezing or shortness of breath.  Marland Kitchen aspirin EC 81 MG tablet Take 1 tablet (81 mg total) by mouth. Every other day  . cyanocobalamin (,VITAMIN B-12,) 1000 MCG/ML injection Inject 1 mL (1,000 mcg total) into the muscle once. (Patient taking differently: Inject 1,000 mcg into the muscle every 30 (thirty) days. )  . DULoxetine (CYMBALTA) 60 MG capsule Take 1 capsule (60 mg total) by mouth daily.  . fexofenadine (ALLEGRA) 60 MG tablet Take 1 tablet (60 mg total) by mouth 2 (two) times daily.  . metroNIDAZOLE (METROGEL VAGINAL) 0.75 % vaginal gel Nightly x 5 nights  . pantoprazole (PROTONIX) 40 MG tablet 1 tab po qd prn acid reflux (Patient taking differently: Take 40 mg by mouth daily as needed. )  . promethazine (PHENERGAN) 25 MG tablet 1/2-1 tab po q6h prn nausea/vomiting  . silver sulfADIAZINE (SILVADENE) 1 % cream Apply 1 application  topically daily.   Facility-Administered Encounter Medications as of 02/06/2019  Medication  . cyanocobalamin ((VITAMIN B-12)) injection 1,000 mcg    Allergies:  Allergies  Allergen Reactions  . Ciprofloxacin Nausea And Vomiting  . Codeine Nausea And Vomiting and Other (See Comments)    Feels funny  . Celebrex [Celecoxib] Rash  . Penicillins Rash    Has patient had a PCN reaction causing immediate rash, facial/tongue/throat swelling, SOB or lightheadedness with hypotension: Rash Has patient had a PCN reaction causing severe rash involving mucus membranes or skin necrosis: No Has patient had a PCN reaction that required hospitalization No Has patient had a PCN reaction occurring within the last 10 years: NO If all of the above answers are "NO", then may proceed with Cephalosporin use.    Family History: Family History  Problem Relation Age of Onset  . Cancer Mother        bladder  . Other Father  had bladder issues    Social History: Social History   Tobacco Use  . Smoking status: Never Smoker  . Smokeless tobacco: Never Used  Substance Use Topics  . Alcohol use: No  . Drug use: No   Social History   Social History Narrative   Married, lives with husband in [place].  Has two grown children.   Retired from [employer], then un-retired to go back to same job--full time.   No t/a/d.   No formal exercise.   Normal american diet.    Review of Systems:  CONSTITUTIONAL: No fevers, chills, night sweats, or weight loss.   EYES: No visual changes or eye pain ENT: No hearing changes.  No history of nose bleeds.   RESPIRATORY: No cough, wheezing and shortness of breath.   CARDIOVASCULAR: Negative for chest pain, and palpitations.   GI: Negative for abdominal discomfort, blood in stools or black stools.  No recent change in bowel habits.   GU:  No history of incontinence.   MUSCLOSKELETAL: No history of joint pain or swelling.  No myalgias.   SKIN: Negative  for lesions, rash, and itching.   HEMATOLOGY/ONCOLOGY: Negative for prolonged bleeding, bruising easily, and swollen nodes.  No history of cancer.   ENDOCRINE: Negative for cold or heat intolerance, polydipsia or goiter.   PSYCH:  No depression or anxiety symptoms.   NEURO: As Above.   Vital Signs:  General Medical Exam:  Well appearing, comfortable.  Nonlabored breathing.   Neurological Exam: MENTAL STATUS including orientation to time, place, person, recent and remote memory, attention span and concentration, language, and fund of knowledge is normal.  Speech is not dysarthric.  CRANIAL NERVES:  Normal conjugate, extra-ocular eye movements in all directions of gaze.  No ptosis.  Normal facial symmetry and movements.  Normal shoulder shrug and head rotation.  Tongue is midline.  MOTOR:  Antigravity in all extremities.  No abnormal movements.  No pronator drift.   SENSORY: Unable to assess.   COORDINATION/GAIT: Normal finger to nose bilaterally.  Intact rapid alternating movements bilaterally.  Able to rise from a chair without using arms.  Gait mildly wide-basedbased and stable.    IMPRESSION/PLAN: Bilateral feet and leg paresthesias, most suggestive of peripheral neuropathy based on the characterization of her symptoms.  Although vitamin B12 can contribute to neuropathy, I doubt this is playing a role at this time.  I recommend NCS/EMG of the legs to characterize the nature of his symptoms.  Doubt this is lumbar radiculopathy due to lack of radiating pain. Check TSH, HbA1c, vitamin B12, vitamin B1, folate, SPEP with IFE, copper at next visit For paresthesias, start gabapentin 100mg  at bedtime x 1 week, then increase to 100mg  twice daily.  Further titrate as able.    Follow Up Instructions:  I discussed the assessment and treatment plan with the patient. The patient was provided an opportunity to ask questions and all were answered. The patient agreed with the plan and demonstrated an  understanding of the instructions.   The patient was advised to call back or seek an in-person evaluation if the symptoms worsen or if the condition fails to improve as anticipated.  Return to clinic in 6 weeks   Alda Berthold, DO

## 2019-02-06 ENCOUNTER — Telehealth (INDEPENDENT_AMBULATORY_CARE_PROVIDER_SITE_OTHER): Payer: Medicare Other | Admitting: Neurology

## 2019-02-06 ENCOUNTER — Other Ambulatory Visit: Payer: Self-pay

## 2019-02-06 DIAGNOSIS — R202 Paresthesia of skin: Secondary | ICD-10-CM

## 2019-02-06 DIAGNOSIS — E538 Deficiency of other specified B group vitamins: Secondary | ICD-10-CM

## 2019-02-06 MED ORDER — GABAPENTIN 100 MG PO CAPS: ORAL_CAPSULE | ORAL | 3 refills | Status: DC

## 2019-02-14 ENCOUNTER — Encounter: Payer: Self-pay | Admitting: Family Medicine

## 2019-02-20 ENCOUNTER — Telehealth: Payer: Self-pay | Admitting: Family Medicine

## 2019-02-20 NOTE — Telephone Encounter (Signed)
Please advise if this is something we can do

## 2019-02-20 NOTE — Telephone Encounter (Signed)
Patient has her mail delivered to the post office. It has become quite difficult to pick up her mail. Can patient get a letter stating she needs to have her mail delivered to her on her porch? Please send letter to her email [email or web address]. Thanks

## 2019-02-21 NOTE — Telephone Encounter (Signed)
Tell her I'm sorry but this is not something that I can do.-thx

## 2019-02-21 NOTE — Telephone Encounter (Signed)
Grandson called and stated the following information. Please advise.

## 2019-02-21 NOTE — Telephone Encounter (Signed)
Pt was called and given information. She verbalized understanding

## 2019-02-21 NOTE — Telephone Encounter (Signed)
Blaze, pt grandson, called the [employer]. He states it is hard for the pt to get to the post office to pick up packages where she has a PO Box. They were going to put in a mail box but it would be regulated to be across a busy road to get the mail. The family is worried about having the pt to cross the road and get hit by a car.   The letter would need to indicate that it is not safe for the pt to cross the street and she would need a box on the house.   Please advise.

## 2019-02-26 NOTE — Telephone Encounter (Signed)
Grandson checking on the status of message below, please advise Tifani Dack [phone]

## 2019-02-26 NOTE — Telephone Encounter (Signed)
Pts grandson has called Please advise

## 2019-02-28 ENCOUNTER — Encounter: Payer: Self-pay | Admitting: Family Medicine

## 2019-02-28 NOTE — Telephone Encounter (Signed)
Letter printed & signed.

## 2019-02-28 NOTE — Telephone Encounter (Signed)
Spoke with pt's daughter, Lavella Lemons and confirmed letter was received via [place]. Hard copy will be sent to pt via mail.

## 2019-03-01 ENCOUNTER — Ambulatory Visit: Payer: Medicare Other

## 2019-04-13 ENCOUNTER — Ambulatory Visit: Payer: Medicare Other | Admitting: Neurology

## 2019-05-10 ENCOUNTER — Other Ambulatory Visit: Payer: Self-pay

## 2019-05-10 ENCOUNTER — Ambulatory Visit (INDEPENDENT_AMBULATORY_CARE_PROVIDER_SITE_OTHER): Payer: Medicare Other | Admitting: Family Medicine

## 2019-05-10 ENCOUNTER — Encounter: Payer: Self-pay | Admitting: Family Medicine

## 2019-05-10 VITALS — BP 107/66 | HR 62 | Wt 148.0 lb

## 2019-05-10 DIAGNOSIS — F411 Generalized anxiety disorder: Secondary | ICD-10-CM

## 2019-05-10 DIAGNOSIS — M797 Fibromyalgia: Secondary | ICD-10-CM | POA: Diagnosis not present

## 2019-05-10 DIAGNOSIS — E538 Deficiency of other specified B group vitamins: Secondary | ICD-10-CM | POA: Diagnosis not present

## 2019-05-10 DIAGNOSIS — G629 Polyneuropathy, unspecified: Secondary | ICD-10-CM

## 2019-05-10 DIAGNOSIS — M810 Age-related osteoporosis without current pathological fracture: Secondary | ICD-10-CM

## 2019-05-10 NOTE — Progress Notes (Signed)
Virtual Visit via Video Note  I connected with pt on 05/10/19 at  1:00 PM EDT by a video enabled telemedicine application and verified that I am speaking with the correct person using two identifiers.  Location patient: home Location provider:work or home office Persons participating in the virtual visit: patient, provider  I discussed the limitations of evaluation and management by telemedicine and the availability of in person appointments. The patient expressed understanding and agreed to proceed.   HPI: 78 y/o WF being seen today for f/u chronic anxiety, fibromyalgia, and ospeoporosis.  Osteo: She has been on q44mo prolia for osteoporosis and is overdue for her next prolia injection. Her anxiety level has been stable. Not in pain lately. She is bothered only by her chronic numbness in feet, which is better with gabapentin but still bothersome.  Unknown etiology, working with neurologist until covid crisis hit.  She has been doing well, enjoying family time a lot.   ROS: no CP, no SOB, no wheezing, no cough, no dizziness, no HAs, no rashes, no melena/hematochezia.  No polyuria or polydipsia.  No myalgias or arthralgias.   Past Medical History:  Diagnosis Date  . Abnormal EKG 05/2016   LAFB, voltage criteria for LVH in lead aVL.  Marland Kitchen Anxiety   . Cataract    right  . Colon cancer screening 06/2017   Cologuard positive--colonoscopy showed adenomatous polyp with high grade dysplasia--recall 3 yrs.  . Cystocele, unspecified (CODE) 2018   Pessary fitted at Dr. Brynda Greathouse:  Milex ring with support #3--great improvement.  . Degenerative arthritis of cervical spine   . Fatty liver   . Fibromyalgia   . GERD (gastroesophageal reflux disease)   . History of hiatal hernia   . Hx: UTI (urinary tract infection)    recurrent.  Pyelo hospitalization 2012  . Hypertension   . Idiopathic urticaria 02/2018  . Microhematuria 02/2012   Cystoscopy with bladder biopsy and cystouretograms--pathology  benign.  . Myalgia    Hx of diffuse myalgias, onset in her 47s  . Osteoporosis 08/10/2016   T-score -2.5: started fosamax 07/2016.  Intol fosamax; started prolia 08/27/2016, most recent prolia was 08/30/2018. DEXA 11/2018 T score -2.3 (improved).  . Periesophageal hiatal hernia 02/2016   Type 4  . Peripheral neuropathy    bilat LLs/feetparesthesias: Dr. Posey Pronto to do NCS/EMGs as of 01/2019  . Thickened endometrium 10/16/2011   on CT.  F/u pelvic u/s showed nabothian cyst to account for this..  No enometrial pathology noted on u/s, no ovaries visualized.  . Vertigo   . Vitamin B12 deficiency 11/2015   + LL PN    Past Surgical History:  Procedure Laterality Date  . BACK SURGERY  1990s x 2   Dr. Leeanne Deed in [place]  . CHOLECYSTECTOMY    . COLONOSCOPY  approx 2002; 08/11/17   2002; Diverticulosis, int hemorrhoids ([facility]).  2018 (for + cologuard)--adenomatous polyps with high grade dysplasia (recall 3 yrs), also diverticulosis.  . COLONOSCOPY N/A 08/11/2017   Procedure: COLONOSCOPY;  Surgeon: Rogene Houston, MD;  [facility]: [facility];  Service: Endoscopy;  Laterality: N/A;  7:30  . DEXA  12/06/2018   T score -2.3 (improved)->continue prolia q24mo  . ESOPHAGEAL MANOMETRY N/A 04/19/2016   Normal.  Procedure: ESOPHAGEAL MANOMETRY (EM);  Surgeon: Mauri Pole, MD;  Location: [facility];  Service: Endoscopy;  Laterality: N/A;  . EYE SURGERY Right    cataract extraction with IOL  . HYSTEROSCOPY  11/2004   Cervical hystogram   .  INSERTION OF MESH N/A 06/18/2016   Procedure: INSERTION OF MESH;  Surgeon: Michael Boston, MD;  Location: [facility];  Service: General;  Laterality: N/A;  . NISSEN FUNDOPLICATION  [id number]  . POLYPECTOMY  08/11/2017   Procedure: POLYPECTOMY;  Surgeon: Rogene Houston, MD;  [facility]: [facility];  Service: Endoscopy;;  colon     Family History  Problem Relation Age of Onset  . Cancer Mother        bladder  . Other Father        had bladder issues      Current Outpatient Medications:  .  aspirin EC 81 MG tablet, Take 1 tablet (81 mg total) by mouth. Every other day, Disp: , Rfl:  .  fexofenadine (ALLEGRA) 60 MG tablet, Take 1 tablet (60 mg total) by mouth 2 (two) times daily., Disp: 60 tablet, Rfl: 1 .  metroNIDAZOLE (METROGEL VAGINAL) 0.75 % vaginal gel, Nightly x 5 nights, Disp: 70 g, Rfl: 0 .  Multiple Vitamins-Minerals (MULTIVITAMIN ADULTS) TABS, Take by mouth daily., Disp: , Rfl:  .  promethazine (PHENERGAN) 25 MG tablet, 1/2-1 tab po q6h prn nausea/vomiting, Disp: 30 tablet, Rfl: 0 .  silver sulfADIAZINE (SILVADENE) 1 % cream, Apply 1 application topically daily., Disp: 50 g, Rfl: 0 .  cyanocobalamin (,VITAMIN B-12,) 1000 MCG/ML injection, Inject 1 mL (1,000 mcg total) into the muscle once. (Patient not taking: Reported on 05/10/2019), Disp: 1 mL, Rfl: 0 .  DULoxetine (CYMBALTA) 60 MG capsule, Take 1 capsule (60 mg total) by mouth daily. (Patient not taking: Reported on 05/10/2019), Disp: 30 capsule, Rfl: 3 .  gabapentin (NEURONTIN) 100 MG capsule, Take 1 tablet at bedtime for one week, then increase to 2 tablets at bedtime (Patient not taking: Reported on 05/10/2019), Disp: 60 capsule, Rfl: 3 .  pantoprazole (PROTONIX) 40 MG tablet, 1 tab po qd prn acid reflux (Patient not taking: Reported on 05/10/2019), Disp: 30 tablet, Rfl: 3  EXAM:  VITALS per patient if applicable: BP 379/02 (BP Location: Left Arm, Patient Position: Sitting, Cuff Size: Normal)   Pulse 62   Wt 148 lb (67.1 kg)   BMI 23.53 kg/m    GENERAL: alert, oriented, appears well and in no acute distress  HEENT: atraumatic, conjunttiva clear, no obvious abnormalities on inspection of external nose and ears  NECK: normal movements of the head and neck  LUNGS: on inspection no signs of respiratory distress, breathing rate appears normal, no obvious gross SOB, gasping or wheezing  CV: no obvious cyanosis  MS: moves all visible extremities without noticeable  abnormality  PSYCH/NEURO: pleasant and cooperative, no obvious depression or anxiety, speech and thought processing grossly intact  LABS: none today  Lab Results  Component Value Date   TSH 2.17 03/15/2017   Lab Results  Component Value Date   WBC 6.3 11/09/2018   HGB 13.4 11/09/2018   HCT 39.1 11/09/2018   MCV 94.5 11/09/2018   PLT 214.0 11/09/2018   Lab Results  Component Value Date   CREATININE 0.69 11/09/2018   BUN 8 11/09/2018   NA 142 11/09/2018   K 3.6 11/09/2018   CL 104 11/09/2018   CO2 32 11/09/2018   Lab Results  Component Value Date   ALT 9 11/09/2018   AST 16 11/09/2018   ALKPHOS 40 11/09/2018   BILITOT 0.7 11/09/2018   Lab Results  Component Value Date   CHOL 183 11/09/2018   Lab Results  Component Value Date   HDL 58.70 11/09/2018  Lab Results  Component Value Date   LDLCALC 100 (H) 11/09/2018   Lab Results  Component Value Date   TRIG 125.0 11/09/2018   Lab Results  Component Value Date   CHOLHDL 3 11/09/2018   Lab Results  Component Value Date   HGBA1C 5.7 12/19/2015    ASSESSMENT AND PLAN:  Discussed the following assessment and plan:  1) GAD: she's doing well but admits she is very fearful about leaving the house now due to covid 19 pandemic, and she does not feel comfortable coming to the office to get prolia injection at this time. Continue duloxetine.  2) Fibromyalgia: asymptomatic lately. Continue duloxetine.  3) Osteoporosis Needs to get back on prolia.  She is too scared to come in the office for this at this time. Continue vit D and calcium supplement.  4) Vit b12 def: has switched over the b12 1000 mcg once a day for the last 3-4 mo b/c fearful of coming into office to get IM b12. We'll recheck vit b12 at next o/v and if normal we'll stop her injections.Marland Kitchen  5) Bilat LE periph neuropathy (numbness): iimproved some on gabapentin. She needs to get back for f/u with her neurologist and discuss next step (?up-titration  of gabapentin?) Continue duloxetine.  No labs needed at this time.   I discussed the assessment and treatment plan with the patient. The patient was provided an opportunity to ask questions and all were answered. The patient agreed with the plan and demonstrated an understanding of the instructions.   The patient was advised to call back or seek an in-person evaluation if the symptoms worsen or if the condition fails to improve as anticipated.  F/u: 6 mo cpe  Signed:  Crissie Sickles, MD           05/10/2019

## 2019-05-22 ENCOUNTER — Telehealth: Payer: Self-pay | Admitting: Family Medicine

## 2019-05-22 NOTE — Telephone Encounter (Signed)
Yes

## 2019-05-22 NOTE — Telephone Encounter (Signed)
Noted  

## 2019-05-22 NOTE — Telephone Encounter (Signed)
Patient is coming into office for Prolia injection 05/24/2019.  Okay to get B12 injection at this time?  Please advise.

## 2019-05-24 ENCOUNTER — Ambulatory Visit (INDEPENDENT_AMBULATORY_CARE_PROVIDER_SITE_OTHER): Payer: Medicare Other | Admitting: Family Medicine

## 2019-05-24 ENCOUNTER — Other Ambulatory Visit: Payer: Self-pay

## 2019-05-24 DIAGNOSIS — M810 Age-related osteoporosis without current pathological fracture: Secondary | ICD-10-CM

## 2019-05-24 DIAGNOSIS — E538 Deficiency of other specified B group vitamins: Secondary | ICD-10-CM | POA: Diagnosis not present

## 2019-05-24 MED ORDER — CYANOCOBALAMIN 1000 MCG/ML IJ SOLN
1000.0000 ug | Freq: Once | INTRAMUSCULAR | Status: AC
  Administered 2019-05-24: 1000 ug via INTRAMUSCULAR

## 2019-05-24 MED ORDER — DENOSUMAB 60 MG/ML ~~LOC~~ SOSY
60.0000 mg | PREFILLED_SYRINGE | Freq: Once | SUBCUTANEOUS | Status: AC
  Administered 2019-05-24: 60 mg via SUBCUTANEOUS

## 2019-05-24 NOTE — Progress Notes (Signed)
Karen Delacruz is a 78 y.o. female presents to the office today for Vitamin B12 and prolia njections, per physician's orders.  Vitamin B12 1031mcg IM was administered left deltoid today. Patient tolerated injection. Prolia 60mg  SQ was administered right arm today. Patient tolerated injection.  Patient next B12 injection due: 06/22/2019, appt made No Patient next Prolia injections due: 11/24/2019, appt made No  Karen Delacruz, Karen Delacruz

## 2019-05-28 ENCOUNTER — Other Ambulatory Visit: Payer: Self-pay

## 2019-05-28 DIAGNOSIS — R202 Paresthesia of skin: Secondary | ICD-10-CM

## 2019-05-31 ENCOUNTER — Encounter: Payer: Medicare Other | Admitting: Neurology

## 2019-05-31 ENCOUNTER — Ambulatory Visit: Payer: Medicare Other | Admitting: Neurology

## 2019-06-11 NOTE — Progress Notes (Signed)
Pt with vit B12 deficiency.  Agree with vit B12 1000 mcg IM in office today. Signed:  Crissie Sickles, MD           06/11/2019

## 2019-08-14 ENCOUNTER — Other Ambulatory Visit: Payer: Self-pay

## 2019-08-14 ENCOUNTER — Ambulatory Visit (INDEPENDENT_AMBULATORY_CARE_PROVIDER_SITE_OTHER): Payer: Medicare Other

## 2019-08-14 DIAGNOSIS — E538 Deficiency of other specified B group vitamins: Secondary | ICD-10-CM

## 2019-08-14 DIAGNOSIS — Z23 Encounter for immunization: Secondary | ICD-10-CM

## 2019-08-14 MED ORDER — CYANOCOBALAMIN 1000 MCG/ML IJ SOLN
1000.0000 ug | Freq: Once | INTRAMUSCULAR | Status: AC
  Administered 2019-08-14: 1000 ug via INTRAMUSCULAR

## 2019-08-14 NOTE — Progress Notes (Signed)
Karen Delacruz is a 78 y.o. female presents to the office today for monthly B12 injection, per physician's orders. B12 1058mcg/ml administered IM in left deltoid. Patient tolerated injection. Next injection should be next month.   [name]   Dr.Kuneff, please sign off in PCP absence.

## 2019-11-14 ENCOUNTER — Ambulatory Visit (INDEPENDENT_AMBULATORY_CARE_PROVIDER_SITE_OTHER): Payer: Medicare PPO | Admitting: Family Medicine

## 2019-11-14 ENCOUNTER — Encounter: Payer: Self-pay | Admitting: Family Medicine

## 2019-11-14 ENCOUNTER — Other Ambulatory Visit: Payer: Self-pay

## 2019-11-14 ENCOUNTER — Encounter: Payer: Medicare Other | Admitting: Family Medicine

## 2019-11-14 DIAGNOSIS — F411 Generalized anxiety disorder: Secondary | ICD-10-CM | POA: Diagnosis not present

## 2019-11-14 DIAGNOSIS — Z23 Encounter for immunization: Secondary | ICD-10-CM

## 2019-11-14 NOTE — Progress Notes (Signed)
Virtual Visit via Video Note  I connected with pt on 11/14/19 at  3:30 PM EST by a video enabled telemedicine application and verified that I am speaking with the correct person using two identifiers.  Location patient: home Location provider:work or home office Persons participating in the virtual visit: patient, provider  I discussed the limitations of evaluation and management by telemedicine and the availability of in person appointments. The patient expressed understanding and agreed to proceed.  Telemedicine visit is a necessity given the COVID-19 restrictions in place at the current time.  HPI: 79 y/o WF being seen today for discussion of covid 19 vaccine and question about a medication of hers. Wants to know if she should get the vaccine.  She heard a bad story about a reaction a friend had to the vaccine.  Pt is high risk and I encouraged her to get the vaccine.  She is very wary of getting out of her home to come up here and get her prolia injection in a couple weeks, wants to know if ok to postpone it. Yes this is fine.  Feeling well.    Past Medical History:  Diagnosis Date  . Abnormal EKG 05/2016   LAFB, voltage criteria for LVH in lead aVL.  Marland Kitchen Anxiety   . Cataract    right  . Colon cancer screening 06/2017   Cologuard positive--colonoscopy showed adenomatous polyp with high grade dysplasia--recall 3 yrs.  . Cystocele, unspecified (CODE) 2018   Pessary fitted at Dr. Brynda [facility]ath[facility]:  Milex ring with support #3--great improvement.  . Degenerative arthritis of cervical spine   . Fatty liver   . Fibromyalgia   . GERD (gastroesophageal reflux disease)   . History of hiatal hernia   . Hx: UTI (urinary tract infection)    recurrent.  Pyelo hospitalization 2012  . Idiopathic urticaria 02/2018  . Microhematuria 02/2012   Cystoscopy with bladder biopsy and cystouretograms--pathology benign.  . Myalgia    Hx of diffuse myalgias, onset in her 64s  . Osteoporosis 08/10/2016   T-score -2.5: started fosamax 07/2016.  Intol fosamax; started prolia 08/27/2016, most recent prolia was 08/30/2018. DEXA 11/2018 T score -2.3 (improved).  . Periesophageal hiatal hernia 02/2016   Type 4  . Peripheral neuropathy    bilat LLs/feetparesthesias: Dr. Posey Pronto to do NCS/EMGs as of 01/2019  . Thickened endometrium 10/16/2011   on CT.  F/u pelvic u/s showed nabothian cyst to account for this..  No enometrial pathology noted on u/s, no ovaries visualized.  . Vertigo   . Vitamin B12 deficiency 11/2015   + LL PN    Past Surgical History:  Procedure Laterality Date  . BACK SURGERY  1990s x 2   Dr. Leeanne Deed in [place]  . CHOLECYSTECTOMY    . COLONOSCOPY  approx 2002; 08/11/17   2002; Diverticulosis, int hemorrhoids ([facility]).  2018 (for + cologuard)--adenomatous polyps with high grade dysplasia (recall 3 yrs), also diverticulosis.  . COLONOSCOPY N/A 08/11/2017   Procedure: COLONOSCOPY;  Surgeon: Rogene Houston, MD;  [facility];  Service: Endoscopy;  Laterality: N/A;  7:30  . DEXA  12/06/2018   T score -2.3 (improved)->continue prolia q57mo  . ESOPHAGEAL MANOMETRY N/A 04/19/2016   Normal.  Procedure: ESOPHAGEAL MANOMETRY (EM);  Surgeon: Mauri Pole, MD;  [facility];  Service: Endoscopy;  Laterality: N/A;  . EYE SURGERY Right    cataract extraction with IOL  . HYSTEROSCOPY  11/2004   Cervical hystogram   . INSERTION OF  MESH N/A 06/18/2016   Procedure: INSERTION OF MESH;  Surgeon: Michael Boston, MD;  Location: [facility];  Service: General;  Laterality: N/A;  . NISSEN FUNDOPLICATION  [id number]  . POLYPECTOMY  08/11/2017   Procedure: POLYPECTOMY;  Surgeon: Rogene Houston, MD;  [facility];  Service: Endoscopy;;  colon     Family History  Problem Relation Age of Onset  . Cancer Mother        bladder  . Other Father        had bladder issues    Current Outpatient Medications:  .  aspirin EC 81 MG tablet, Take 1 tablet (81 mg total) by  mouth. Every other day, Disp: , Rfl:  .  cyanocobalamin (,VITAMIN B-12,) 1000 MCG/ML injection, Inject 1 mL (1,000 mcg total) into the muscle once. (Patient not taking: Reported on 05/10/2019), Disp: 1 mL, Rfl: 0 .  DULoxetine (CYMBALTA) 60 MG capsule, Take 1 capsule (60 mg total) by mouth daily. (Patient not taking: Reported on 05/10/2019), Disp: 30 capsule, Rfl: 3 .  fexofenadine (ALLEGRA) 60 MG tablet, Take 1 tablet (60 mg total) by mouth 2 (two) times daily., Disp: 60 tablet, Rfl: 1 .  gabapentin (NEURONTIN) 100 MG capsule, Take 1 tablet at bedtime for one week, then increase to 2 tablets at bedtime (Patient not taking: Reported on 05/10/2019), Disp: 60 capsule, Rfl: 3 .  metroNIDAZOLE (METROGEL VAGINAL) 0.75 % vaginal gel, Nightly x 5 nights, Disp: 70 g, Rfl: 0 .  Multiple Vitamins-Minerals (MULTIVITAMIN ADULTS) TABS, Take by mouth daily., Disp: , Rfl:  .  pantoprazole (PROTONIX) 40 MG tablet, 1 tab po qd prn acid reflux (Patient not taking: Reported on 05/10/2019), Disp: 30 tablet, Rfl: 3 .  promethazine (PHENERGAN) 25 MG tablet, 1/2-1 tab po q6h prn nausea/vomiting, Disp: 30 tablet, Rfl: 0 .  silver sulfADIAZINE (SILVADENE) 1 % cream, Apply 1 application topically daily., Disp: 50 g, Rfl: 0  EXAM:  VITALS per patient if applicable: There were no vitals taken for this visit.   GENERAL: alert, oriented, appears well and in no acute distress  HEENT: atraumatic, conjunttiva clear, no obvious abnormalities on inspection of external nose and ears  NECK: normal movements of the head and neck  LUNGS: on inspection no signs of respiratory distress, breathing rate appears normal, no obvious gross SOB, gasping or wheezing  CV: no obvious cyanosis  MS: moves all visible extremities without noticeable abnormality  PSYCH/NEURO: pleasant and cooperative, no obvious depression or anxiety, speech and thought processing grossly intact  LABS: none today  Lab Results  Component Value Date   TSH 2.17  03/15/2017   Lab Results  Component Value Date   WBC 6.3 11/09/2018   HGB 13.4 11/09/2018   HCT 39.1 11/09/2018   MCV 94.5 11/09/2018   PLT 214.0 11/09/2018   Lab Results  Component Value Date   CREATININE 0.69 11/09/2018   BUN 8 11/09/2018   NA 142 11/09/2018   K 3.6 11/09/2018   CL 104 11/09/2018   CO2 32 11/09/2018   Lab Results  Component Value Date   ALT 9 11/09/2018   AST 16 11/09/2018   ALKPHOS 40 11/09/2018   BILITOT 0.7 11/09/2018   Lab Results  Component Value Date   CHOL 183 11/09/2018   Lab Results  Component Value Date   HDL 58.70 11/09/2018   Lab Results  Component Value Date   LDLCALC 100 (H) 11/09/2018   Lab Results  Component Value Date   TRIG 125.0  11/09/2018   Lab Results  Component Value Date   CHOLHDL 3 11/09/2018   Lab Results  Component Value Date   HGBA1C 5.7 12/19/2015   Lab Results  Component Value Date   VITAMINB12 460 02/01/2017    ASSESSMENT AND PLAN:  Discussed the following assessment and plan:  1) Covid 19 vaccine education/discussion. I encouraged pt to get the covid 19 vaccine, sent vaccine info to her. I also said it was fine for her to postpone her next prolia until a time when the pandemic has slowed enough for her to feel more comfortable about getting out and coming into our office.  She has pretty significant generalized anxiety d/o and this is clearly showing through in the current times we live in. No med changes today.   The patient was provided an opportunity to ask questions and all were answered. The patient agreed with the plan and demonstrated an understanding of the instructions.   The patient was advised to call back or seek an in-person evaluation if the symptoms worsen or if the condition fails to improve as anticipated.  F/u: prn  Signed:  Crissie Sickles, MD           11/14/2019

## 2020-01-26 ENCOUNTER — Other Ambulatory Visit: Payer: Self-pay

## 2020-01-26 ENCOUNTER — Ambulatory Visit (INDEPENDENT_AMBULATORY_CARE_PROVIDER_SITE_OTHER): Payer: Medicare PPO | Admitting: Family Medicine

## 2020-01-26 DIAGNOSIS — J069 Acute upper respiratory infection, unspecified: Secondary | ICD-10-CM

## 2020-01-26 MED ORDER — FLUTICASONE PROPIONATE 50 MCG/ACT NA SUSP: 2.0000 | Freq: Every day | NASAL | 6 refills | Status: DC

## 2020-01-26 NOTE — Assessment & Plan Note (Signed)
Patient symptoms could be related to a viral URI versus COVID-19 versus allergies.  Discussed at this point time it would be less likely to be related to sinusitis.  Advised that we could not rule out COVID-19 without testing.  I gave them the website to go to to schedule testing through [facility].  Discussed quarantining until we get the test results back.  Advised if there was going to be anyone in her house around her they should be wearing a mask if they are able to.  Advised if she develops chest pain, dyspnea, high fevers, or feels increasingly worse she should go to the emergency department.  We will treat symptomatically with over-the-counter allergy medicine as well as Flonase.  She can take Tylenol for any fevers or body aches or headaches.

## 2020-01-26 NOTE — Progress Notes (Signed)
Virtual Visit via video Note  This visit type was conducted due to national recommendations for restrictions regarding the COVID-19 pandemic (e.g. social distancing).  This format is felt to be most appropriate for this patient at this time.  All issues noted in this document were discussed and addressed.  No physical exam was performed (except for noted visual exam findings with Video Visits).   I connected with Karen Delacruz today at  9:00 AM EDT by a video enabled telemedicine application or telephone and verified that I am speaking with the correct person using two identifiers. Location patient: home Location provider: work Persons participating in the virtual visit: patient, provider, Mykeia Wingerter (daughter)  I discussed the limitations, risks, security and privacy concerns of performing an evaluation and management service by telephone and the availability of in person appointments. I also discussed with the patient that there may be a patient responsible charge related to this service. The patient expressed understanding and agreed to proceed.  Reason for visit: same day visit  HPI: Upper respiratory faction: Patient notes for the last 3 to 4 days she has had some sore throat with rhinorrhea and nasal congestion.  Occasionally feels like she cannot get a breath in.  Some headaches as well.  Mild cough.  Does have fatigue.  No fever or taste or smell disturbances.  She has not been vaccinated against Covid.  No Covid contacts.  No sick contacts.  She does not go out in public and has her groceries delivered.  She does note she typically has these symptoms this time a year.   ROS: See pertinent positives and negatives per HPI.  Past Medical History:  Diagnosis Date   Abnormal EKG 05/2016   LAFB, voltage criteria for LVH in lead aVL.   Anxiety    Cataract    right   Colon cancer screening 06/2017   Cologuard positive--colonoscopy showed adenomatous polyp with high grade  dysplasia--recall 3 yrs.   Cystocele, unspecified (CODE) 2018   Pessary fitted at Dr. Brynda Greathouse:  Milex ring with support #3--great improvement.   Degenerative arthritis of cervical spine    Fatty liver    Fibromyalgia    GERD (gastroesophageal reflux disease)    History of hiatal hernia    Hx: UTI (urinary tract infection)    recurrent.  Pyelo hospitalization 2012   Idiopathic urticaria 02/2018   Microhematuria 02/2012   Cystoscopy with bladder biopsy and cystouretograms--pathology benign.   Myalgia    Hx of diffuse myalgias, onset in her 77s   Osteoporosis 08/10/2016   T-score -2.5: started fosamax 07/2016.  Intol fosamax; started prolia 08/27/2016, most recent prolia was 08/30/2018. DEXA 11/2018 T score -2.3 (improved).   Periesophageal hiatal hernia 02/2016   Type 4   Peripheral neuropathy    bilat LLs/feetparesthesias: Dr. Posey Pronto to do NCS/EMGs as of 01/2019   Thickened endometrium 10/16/2011   on CT.  F/u pelvic u/s showed nabothian cyst to account for this..  No enometrial pathology noted on u/s, no ovaries visualized.   Vertigo    Vitamin B12 deficiency 11/2015   + LL PN    Past Surgical History:  Procedure Laterality Date   BACK SURGERY  1990s x 2   Dr. Leeanne Deed in [place]  approx 2002; 08/11/17   2002; Diverticulosis, int hemorrhoids ([facility]).  2018 (for + cologuard)--adenomatous polyps with high grade dysplasia (recall 3 yrs), also diverticulosis.   COLONOSCOPY N/A 08/11/2017  Procedure: COLONOSCOPY;  Surgeon: Rogene Houston, MD;  Location: [facility];  Service: Endoscopy;  Laterality: N/A;  7:30   DEXA  12/06/2018   T score -2.3 (improved)->continue prolia q56mo   ESOPHAGEAL MANOMETRY N/A 04/19/2016   Normal.  Procedure: ESOPHAGEAL MANOMETRY (EM);  Surgeon: Mauri Pole, MD;  Location: [facility];  Service: Endoscopy;  Laterality: N/A;   EYE SURGERY Right    cataract extraction with IOL   HYSTEROSCOPY   11/2004   Cervical hystogram    INSERTION OF MESH N/A 06/18/2016   Procedure: INSERTION OF MESH;  Surgeon: Michael Boston, MD;  Location: [facility];  Service: General;  Laterality: N/A;   NISSEN FUNDOPLICATION  [id number]   POLYPECTOMY  08/11/2017   Procedure: POLYPECTOMY;  Surgeon: Rogene Houston, MD;  Location: [facility];  Service: Endoscopy;;  colon     Family History  Problem Relation Age of Onset   Cancer Mother        bladder   Other Father        had bladder issues    SOCIAL HX: Non-smoker   Current Outpatient Medications:    Ascorbic Acid (VITAMIN C PO), Take by mouth daily., Disp: , Rfl:    aspirin EC 81 MG tablet, Take 1 tablet (81 mg total) by mouth. Every other day, Disp: , Rfl:    cyanocobalamin (,VITAMIN B-12,) 1000 MCG/ML injection, Inject 1 mL (1,000 mcg total) into the muscle once., Disp: 1 mL, Rfl: 0   Cyanocobalamin (VITAMIN B 12 PO), Take by mouth daily., Disp: , Rfl:    DULoxetine (CYMBALTA) 60 MG capsule, Take 1 capsule (60 mg total) by mouth daily., Disp: 30 capsule, Rfl: 3   fexofenadine (ALLEGRA) 60 MG tablet, Take 1 tablet (60 mg total) by mouth 2 (two) times daily., Disp: 60 tablet, Rfl: 1   metroNIDAZOLE (METROGEL VAGINAL) 0.75 % vaginal gel, Nightly x 5 nights, Disp: 70 g, Rfl: 0   Multiple Vitamins-Minerals (MULTIVITAMIN ADULTS) TABS, Take by mouth daily., Disp: , Rfl:    pantoprazole (PROTONIX) 40 MG tablet, 1 tab po qd prn acid reflux, Disp: 30 tablet, Rfl: 3   promethazine (PHENERGAN) 25 MG tablet, 1/2-1 tab po q6h prn nausea/vomiting, Disp: 30 tablet, Rfl: 0   fluticasone (FLONASE) 50 MCG/ACT nasal spray, Place 2 sprays into both nostrils daily., Disp: 16 g, Rfl: 6  EXAM:  VITALS per patient if applicable:  GENERAL: alert, oriented, appears well and in no acute distress  HEENT: atraumatic, conjunttiva clear, no obvious abnormalities on inspection of external nose and ears  NECK: normal movements of the head and neck  LUNGS:  on inspection no signs of respiratory distress, breathing rate appears normal, no obvious gross SOB, gasping or wheezing  CV: no obvious cyanosis  MS: moves all visible extremities without noticeable abnormality  PSYCH/NEURO: pleasant and cooperative, no obvious depression or anxiety, speech and thought processing grossly intact  ASSESSMENT AND PLAN:  Discussed the following assessment and plan:  Upper respiratory infection Patient symptoms could be related to a viral URI versus COVID-19 versus allergies.  Discussed at this point time it would be less likely to be related to sinusitis.  Advised that we could not rule out COVID-19 without testing.  I gave them the website to go to to schedule testing through [facility].  Discussed quarantining until we get the test results back.  Advised if there was going to be anyone in her house around her they should be wearing a mask if  they are able to.  Advised if she develops chest pain, dyspnea, high fevers, or feels increasingly worse she should go to the emergency department.  We will treat symptomatically with over-the-counter allergy medicine as well as Flonase.  She can take Tylenol for any fevers or body aches or headaches.   No orders of the defined types were placed in this encounter.   Meds ordered this encounter  Medications   fluticasone (FLONASE) 50 MCG/ACT nasal spray    Sig: Place 2 sprays into both nostrils daily.    Dispense:  16 g    Refill:  6     I discussed the assessment and treatment plan with the patient. The patient was provided an opportunity to ask questions and all were answered. The patient agreed with the plan and demonstrated an understanding of the instructions.   The patient was advised to call back or seek an in-person evaluation if the symptoms worsen or if the condition fails to improve as anticipated.  Tommi Rumps, MD

## 2020-03-17 ENCOUNTER — Encounter: Payer: Self-pay | Admitting: Family Medicine

## 2020-03-17 ENCOUNTER — Ambulatory Visit: Payer: Medicare PPO | Admitting: Family Medicine

## 2020-03-17 ENCOUNTER — Other Ambulatory Visit: Payer: Self-pay

## 2020-03-17 VITALS — BP 133/89 | HR 72 | Temp 98.0°F | Resp 16 | Ht 66.5 in | Wt 156.0 lb

## 2020-03-17 DIAGNOSIS — R5382 Chronic fatigue, unspecified: Secondary | ICD-10-CM

## 2020-03-17 DIAGNOSIS — E538 Deficiency of other specified B group vitamins: Secondary | ICD-10-CM

## 2020-03-17 DIAGNOSIS — M810 Age-related osteoporosis without current pathological fracture: Secondary | ICD-10-CM

## 2020-03-17 LAB — COMPREHENSIVE METABOLIC PANEL
ALT: 9 U/L (ref 0–35)
AST: 18 U/L (ref 0–37)
Albumin: 4.2 g/dL (ref 3.5–5.2)
Alkaline Phosphatase: 73 U/L (ref 39–117)
BUN: 13 mg/dL (ref 6–23)
CO2: 30 mEq/L (ref 19–32)
Calcium: 10 mg/dL (ref 8.4–10.5)
Chloride: 102 mEq/L (ref 96–112)
Creatinine, Ser: 0.64 mg/dL (ref 0.40–1.20)
GFR: 89.71 mL/min (ref >60.00)
Glucose, Bld: 91 mg/dL (ref 70–99)
Potassium: 3.8 mEq/L (ref 3.5–5.1)
Sodium: 140 mEq/L (ref 135–145)
Total Bilirubin: 0.7 mg/dL (ref 0.2–1.2)
Total Protein: 7 g/dL (ref 6.0–8.3)

## 2020-03-17 LAB — CBC WITH DIFFERENTIAL/PLATELET
Basophils Absolute: 0.1 10*3/uL (ref 0.0–0.1)
Basophils Relative: 1.1 % (ref 0.0–3.0)
Eosinophils Absolute: 0.2 10*3/uL (ref 0.0–0.7)
Eosinophils Relative: 3.3 % (ref 0.0–5.0)
HCT: 39.7 % (ref 36.0–46.0)
Hemoglobin: 13.6 g/dL (ref 12.0–15.0)
Lymphocytes Relative: 35.7 % (ref 12.0–46.0)
Lymphs Abs: 2.2 10*3/uL (ref 0.7–4.0)
MCHC: 34.3 g/dL (ref 30.0–36.0)
MCV: 95.3 fl (ref 78.0–100.0)
Monocytes Absolute: 0.6 10*3/uL (ref 0.1–1.0)
Monocytes Relative: 10.1 % (ref 3.0–12.0)
Neutro Abs: 3 10*3/uL (ref 1.4–7.7)
Neutrophils Relative %: 49.8 % (ref 43.0–77.0)
Platelets: 236 10*3/uL (ref 150.0–400.0)
RBC: 4.16 Mil/uL (ref 3.87–5.11)
RDW: 12.8 % (ref 11.5–15.5)
WBC: 6.1 10*3/uL (ref 4.0–10.5)

## 2020-03-17 LAB — VITAMIN B12: Vitamin B-12: 377 pg/mL (ref 211–911)

## 2020-03-17 LAB — SEDIMENTATION RATE: Sed Rate: 8 mm/hr (ref 0–30)

## 2020-03-17 LAB — TSH: TSH: 1.46 u[IU]/mL (ref 0.35–4.50)

## 2020-03-17 LAB — VITAMIN D 25 HYDROXY (VIT D DEFICIENCY, FRACTURES): VITD: 38.73 ng/mL (ref 30.00–100.00)

## 2020-03-17 LAB — C-REACTIVE PROTEIN: CRP: <1 mg/dL (ref 0.5–20.0)

## 2020-03-17 MED ORDER — CYANOCOBALAMIN 1000 MCG/ML IJ SOLN
1000.0000 ug | Freq: Once | INTRAMUSCULAR | Status: AC
  Administered 2020-03-17: 1000 ug via INTRAMUSCULAR

## 2020-03-17 NOTE — Progress Notes (Signed)
See med student note from this date. Signed:  Crissie Sickles, MD           03/17/2020

## 2020-03-17 NOTE — Progress Notes (Signed)
CC:  Patient is a 79 y.o. Caucasian female with a PMH of GAD, GERD, and Vit B12 def who presents for fatigue.  HPI:   At the time of last visit (virtual, 10/2019) she was doing fine. Her last in-person visit was July 2020. A/P as of that visit: "1) GAD: she's doing well but admits she is very fearful about leaving the house now due to covid 19 pandemic, and she does not feel comfortable coming to the office to get prolia injection at this time. Continue duloxetine.  2) Fibromyalgia: asymptomatic lately. Continue duloxetine.  3) Osteoporosis Needsto get back onprolia.She is too scared to come in the officefor this at this time. Continue vit D and calcium supplement.  4)Vit b12 def: has switched over the b12 1000 mcg once a day for the last 3-4 mob/c fearful of coming into office to get IM b12. We'll recheck vit b12 at next o/v and if normal we'll stop her injections.Marland Kitchen  5) Bilat LE periph neuropathy (numbness): iimproved some on gabapentin. She needs to get back for f/u with her neurologist and discuss next step (?up-titration of gabapentin?) Continue duloxetine.  No labs needed at this time."  HPI: Pt has long hx of waxing/waning fatigue. Started feeling signif more tired in the past few weeks  Has not been getting B12 shots but instead has been taking one vitamin B12 pill a day (does not know dose) for about the last 6 mo. No SOB or chest pain  Sleeps about 8hrs / night; dozing off in the day when sitting in a chair. No N/V/C/D or blood in stool or in urine.  No vag bleeding. No fever, sore throat, cough  Headache yes sometimes but thinks allergy related  Feels light headed/room spinning when getting up quickly but not like vertigo  Sx's do not seem related to any of her meds. Have had vertigo in the past  Not been able to do as many activities like painting  Had iron def anemia when younger  Appetite unaffected  Mood good, no feelings of depression or anxiety   No snoring that she knows of, no hx of witnessed apnea in sleep, no awakening with gasp. Seems to be max tired in morning, gets a bit better as the day goes on. Feeling winded more easily with ambulating, rests a couple minutes and is back to baseling. No SOB at rest, no CP, No LE swelling, no PND, no orthopnea, no jaw or arm pain.   ROS: no fevers,no wheezing, no cough,no rashes or other skin changes, no melena/hematochezia.  No polyuria or polydipsia.  No myalgias or arthralgias.  No focal weakness, paresthesias, or tremors.  No acute vision or hearing abnormalities. No n/v/d or abd pain.  No palpitations.     PMH: Past Medical History:  Diagnosis Date  . Abnormal EKG 05/2016   LAFB, voltage criteria for LVH in lead aVL.  Marland Kitchen Anxiety   . Cataract    right  . Colon cancer screening 06/2017   Cologuard positive--colonoscopy showed adenomatous polyp with high grade dysplasia--recall 3 yrs.  . Cystocele, unspecified (CODE) 2018   Pessary fitted at Dr. Brynda Greathouse:  Milex ring with support #3--great improvement.  . Degenerative arthritis of cervical spine   . Fatty liver   . Fibromyalgia   . GERD (gastroesophageal reflux disease)   . History of hiatal hernia   . Hx: UTI (urinary tract infection)    recurrent.  Pyelo hospitalization 2012  . Idiopathic urticaria 02/2018  .  Microhematuria 02/2012   Cystoscopy with bladder biopsy and cystouretograms--pathology benign.  . Myalgia    Hx of diffuse myalgias, onset in her 59s  . Osteoporosis 08/10/2016   T-score -2.5: started fosamax 07/2016.  Intol fosamax; started prolia 08/27/2016, most recent prolia was 08/30/2018. DEXA 11/2018 T score -2.3 (improved).  . Periesophageal hiatal hernia 02/2016   Type 4  . Peripheral neuropathy    bilat LLs/feetparesthesias: Dr. Posey Pronto to do NCS/EMGs as of 01/2019  . Thickened endometrium 10/16/2011   on CT.  F/u pelvic u/s showed nabothian cyst to account for this..  No enometrial pathology noted on u/s, no  ovaries visualized.  . Vertigo   . Vitamin B12 deficiency 11/2015   + LL PN    M/A: Current Outpatient Medications on File Prior to Visit  Medication Sig Dispense Refill  . Ascorbic Acid (VITAMIN C PO) Take by mouth daily.    Marland Kitchen aspirin EC 81 MG tablet Take 1 tablet (81 mg total) by mouth. Every other day    . Cyanocobalamin (VITAMIN B 12 PO) Take by mouth daily.    . DULoxetine (CYMBALTA) 60 MG capsule Take 1 capsule (60 mg total) by mouth daily. 30 capsule 3  . fexofenadine (ALLEGRA) 60 MG tablet Take 1 tablet (60 mg total) by mouth 2 (two) times daily. 60 tablet 1  . fluticasone (FLONASE) 50 MCG/ACT nasal spray Place 2 sprays into both nostrils daily. 16 g 6  . Multiple Vitamins-Minerals (MULTIVITAMIN ADULTS) TABS Take by mouth daily.    . cyanocobalamin (,VITAMIN B-12,) 1000 MCG/ML injection Inject 1 mL (1,000 mcg total) into the muscle once. (Patient not taking: Reported on 03/17/2020) 1 mL 0  . metroNIDAZOLE (METROGEL VAGINAL) 0.75 % vaginal gel Nightly x 5 nights (Patient not taking: Reported on 03/17/2020) 70 g 0  . pantoprazole (PROTONIX) 40 MG tablet 1 tab po qd prn acid reflux (Patient not taking: Reported on 03/17/2020) 30 tablet 3  . promethazine (PHENERGAN) 25 MG tablet 1/2-1 tab po q6h prn nausea/vomiting (Patient not taking: Reported on 03/17/2020) 30 tablet 0   No current facility-administered medications on file prior to visit.   Allergies  Allergen Reactions  . Ciprofloxacin Nausea And Vomiting  . Codeine Nausea And Vomiting and Other (See Comments)    Feels funny  . Celebrex [Celecoxib] Rash  . Penicillins Rash    Has patient had a PCN reaction causing immediate rash, facial/tongue/throat swelling, SOB or lightheadedness with hypotension: Rash Has patient had a PCN reaction causing severe rash involving mucus membranes or skin necrosis: No Has patient had a PCN reaction that required hospitalization No Has patient had a PCN reaction occurring within the last 10 years:  NO If all of the above answers are "NO", then may proceed with Cephalosporin use.    FH: Family History  Problem Relation Age of Onset  . Cancer Mother        bladder  . Other Father        had bladder issues    SH: Social History   Socioeconomic History  . Marital status: Married    Spouse name: Not on file  . Number of children: Not on file  . Years of education: Not on file  . Highest education level: Not on file  Occupational History  . Not on file  Tobacco Use  . Smoking status: Never Smoker  . Smokeless tobacco: Never Used  Substance and Sexual Activity  . Alcohol use: No  . Drug use: No  .  Sexual activity: Yes    Birth control/protection: Post-menopausal  Other Topics Concern  . Not on file  Social History Narrative   Married, lives with husband in [place].  Has two grown children.   Retired from [employer], then un-retired to go back to same job--full time.   No t/a/d.   No formal exercise.   Normal american diet.   Social Determinants of Health   Financial Resource Strain:   . Difficulty of Paying Living Expenses:   Food Insecurity:   . Worried About Charity fundraiser in the Last Year:   . Arboriculturist in the Last Year:   Transportation Needs:   . Film/video editor (Medical):   Marland Kitchen Lack of Transportation (Non-Medical):   Physical Activity:   . Days of Exercise per Week:   . Minutes of Exercise per Session:   Stress:   . Feeling of Stress :   Social Connections:   . Frequency of Communication with Friends and Family:   . Frequency of Social Gatherings with Friends and Family:   . Attends Religious Services:   . Active Member of Clubs or Organizations:   . Attends Archivist Meetings:   Marland Kitchen Marital Status:     ROS: Review of Systems  Constitutional: Negative.  Negative for chills, diaphoresis, fever, malaise/fatigue and weight loss.  HENT: Positive for congestion. Negative for hearing loss, sinus pain and sore throat.    Eyes: Negative for blurred vision, double vision, photophobia, pain, discharge and redness.  Respiratory: Positive for shortness of breath (sometimes when walking around). Negative for cough, hemoptysis, sputum production, wheezing and stridor.   Cardiovascular: Positive for leg swelling (trace pitting edema b/l). Negative for chest pain, palpitations, orthopnea, claudication and PND.  Gastrointestinal: Negative.  Negative for abdominal pain, blood in stool, constipation, diarrhea, heartburn, nausea and vomiting.  Genitourinary: Negative.  Negative for dysuria, flank pain, frequency, hematuria and urgency.  Musculoskeletal: Negative.   Skin: Negative.   Neurological: Positive for dizziness (feels off balance when standing up quickly) and headaches (feels like typical allergy headache). Negative for tingling, tremors, sensory change, speech change, focal weakness, seizures, loss of consciousness and weakness.  Endo/Heme/Allergies: Negative.   Psychiatric/Behavioral: Negative.  Negative for depression. The patient is not nervous/anxious and does not have insomnia.     PE: Vitals with BMI 03/17/2020 01/26/2020 05/10/2019  Height 5' 6.5" 5' 6.5" -  Weight 156 lbs 149 lbs 148 lbs  BMI 16.9 67.89 -  Systolic 381 017 510  Diastolic 89 73 66  Pulse 72 - 62    Physical Exam  Constitutional: She is oriented to person, place, and time and well-developed, well-nourished, and in no distress. No distress.  HENT:  Head: Normocephalic and atraumatic.  Right Ear: External ear normal.  Left Ear: External ear normal.  Nose: Nose normal.  Mouth/Throat: Oropharynx is clear and moist. No oropharyngeal exudate.  Eyes: Pupils are equal, round, and reactive to light. Conjunctivae and EOM are normal. Right eye exhibits no discharge. Left eye exhibits no discharge. No scleral icterus.  Neck: No JVD present. No tracheal deviation present. No thyromegaly present.  Cardiovascular: Normal rate, regular rhythm, normal  heart sounds and intact distal pulses. Exam reveals no gallop and no friction rub.  No murmur heard. Pulmonary/Chest: Effort normal and breath sounds normal. No stridor. No respiratory distress. She has no wheezes. She has no rales. She exhibits no tenderness.  Musculoskeletal:        General: Edema (  trace pitting edema legs b/l) present.     Cervical back: Normal range of motion and neck supple.  Lymphadenopathy:    She has no cervical adenopathy.  Neurological: She is alert and oriented to person, place, and time. No cranial nerve deficit. Coordination (off balance on tightrope walking and heel toe and tip toe testing) abnormal.  Skin: Skin is warm and dry. No rash noted. She is not diaphoretic. No erythema. No pallor.  Psychiatric: Mood, memory, affect and judgment normal.    Labs:   Chemistry      Component Value Date/Time   NA 142 11/09/2018 0845   K 3.6 11/09/2018 0845   CL 104 11/09/2018 0845   CO2 32 11/09/2018 0845   BUN 8 11/09/2018 0845   CREATININE 0.69 11/09/2018 0845      Component Value Date/Time   CALCIUM 9.0 11/09/2018 0845   ALKPHOS 40 11/09/2018 0845   AST 16 11/09/2018 0845   ALT 9 11/09/2018 0845   BILITOT 0.7 11/09/2018 0845     Lab Results  Component Value Date   WBC 6.3 11/09/2018   HGB 13.4 11/09/2018   HCT 39.1 11/09/2018   MCV 94.5 11/09/2018   PLT 214.0 11/09/2018   Lab Results  Component Value Date   VITAMINB12 460 02/01/2017   Lab Results  Component Value Date   TSH 2.17 03/15/2017   Lab Results  Component Value Date   HGBA1C 5.7 12/19/2015   A/P: In summary, [name] is a 79 year old woman with a past medical history of GAD, GERD, and Vit B12 deficiency who presents with CC. On physical exam, she has some trouble with tight rope walking but has a negative Romberg test and neuro exam and is otherwise unremarkable.  A/P:  Fatigue: unknown etiology but possible B12 def since getting off injections and on oral b12 supplement.   Consider iron def anemia, PMR, hypothyroidism.  Less likely: OSA, myasthenia gravis, cardiac or pulm etiology. Chronic fatigue syndrome high on diff but dx of exclusion. Labs: check Vit D, Vit B12, Iron studies, TSH, CBC, CMP, ESR/CRP (rule out PMR/inflammatory process)  No imaging indicated today.  Vit b12 def: restart IM Vitamin B12 1000 mcg today, return in 2 wks for same.   Bilat LE periph neuropathy (numbness): continue meds as indicated (duloxetine) GAD: Continue meds as indicated (duloxetine) Fibromyalgia: asymptomatic lately. Continue meds as indicated (duloxetine) Osteoporosis: Needsto get back onprolia--she need insurance re-approval so we'll get this process going. In meantime, continue vit D and calcium supplement.  Follow Up:  1 month  Signed: Bennie Dallas, MS3 17 Mar 2020   I personally was present during the history, physical exam, and medical decision-making activities of this service and have verified that the service and findings are accurately documented in the student's note. Signed:  Crissie Sickles, MD           03/17/2020

## 2020-03-18 ENCOUNTER — Telehealth: Payer: Self-pay | Admitting: Family Medicine

## 2020-03-18 LAB — IRON,TIBC AND FERRITIN PANEL
%SAT: 36 % (calc) (ref 16–45)
Ferritin: 25 ng/mL (ref 16–288)
Iron: 123 ug/dL (ref 45–160)
TIBC: 341 mcg/dL (calc) (ref 250–450)

## 2020-03-18 NOTE — Telephone Encounter (Signed)
Patient wanting to know if she needed to follow up in 4 weeks still since she felt better?  Also, did you want her to do B12 injections every 2 weeks or every month?  Please advise.

## 2020-03-18 NOTE — Telephone Encounter (Signed)
OK to NOT f/u in 4 wks if feeling better. Also, b12 every 4 wks, not every 2 wks--my mistake.

## 2020-03-18 NOTE — Telephone Encounter (Signed)
Patient advised.

## 2020-04-03 DIAGNOSIS — H524 Presbyopia: Secondary | ICD-10-CM | POA: Diagnosis not present

## 2020-04-03 DIAGNOSIS — H26491 Other secondary cataract, right eye: Secondary | ICD-10-CM | POA: Diagnosis not present

## 2020-04-03 DIAGNOSIS — H353131 Nonexudative age-related macular degeneration, bilateral, early dry stage: Secondary | ICD-10-CM | POA: Diagnosis not present

## 2020-04-03 DIAGNOSIS — H5212 Myopia, left eye: Secondary | ICD-10-CM | POA: Diagnosis not present

## 2020-04-03 DIAGNOSIS — H52222 Regular astigmatism, left eye: Secondary | ICD-10-CM | POA: Diagnosis not present

## 2020-04-03 DIAGNOSIS — H2512 Age-related nuclear cataract, left eye: Secondary | ICD-10-CM | POA: Diagnosis not present

## 2020-04-03 DIAGNOSIS — H5201 Hypermetropia, right eye: Secondary | ICD-10-CM | POA: Diagnosis not present

## 2020-04-09 DIAGNOSIS — H2513 Age-related nuclear cataract, bilateral: Secondary | ICD-10-CM | POA: Diagnosis not present

## 2020-04-09 DIAGNOSIS — H26491 Other secondary cataract, right eye: Secondary | ICD-10-CM | POA: Diagnosis not present

## 2020-04-09 DIAGNOSIS — H2512 Age-related nuclear cataract, left eye: Secondary | ICD-10-CM | POA: Diagnosis not present

## 2020-04-09 DIAGNOSIS — Z01818 Encounter for other preprocedural examination: Secondary | ICD-10-CM | POA: Diagnosis not present

## 2020-04-14 ENCOUNTER — Ambulatory Visit: Payer: Medicare PPO | Admitting: Family Medicine

## 2020-04-18 DIAGNOSIS — H2512 Age-related nuclear cataract, left eye: Secondary | ICD-10-CM | POA: Diagnosis not present

## 2020-04-18 DIAGNOSIS — H25812 Combined forms of age-related cataract, left eye: Secondary | ICD-10-CM | POA: Diagnosis not present

## 2020-04-21 ENCOUNTER — Other Ambulatory Visit: Payer: Self-pay

## 2020-04-21 ENCOUNTER — Ambulatory Visit (INDEPENDENT_AMBULATORY_CARE_PROVIDER_SITE_OTHER): Payer: Medicare PPO | Admitting: Family Medicine

## 2020-04-21 DIAGNOSIS — E538 Deficiency of other specified B group vitamins: Secondary | ICD-10-CM

## 2020-04-21 DIAGNOSIS — M810 Age-related osteoporosis without current pathological fracture: Secondary | ICD-10-CM

## 2020-04-21 MED ORDER — CYANOCOBALAMIN 1000 MCG/ML IJ SOLN
1000.0000 ug | Freq: Once | INTRAMUSCULAR | Status: AC
  Administered 2020-04-21: 1000 ug via INTRAMUSCULAR

## 2020-04-21 MED ORDER — DENOSUMAB 60 MG/ML ~~LOC~~ SOSY
60.0000 mg | PREFILLED_SYRINGE | Freq: Once | SUBCUTANEOUS | Status: AC
  Administered 2020-04-21: 60 mg via SUBCUTANEOUS

## 2020-04-21 NOTE — Progress Notes (Signed)
Karen Delacruz is a 79 y.o. female presents to the office today for Vitamin B12 and prolia njections, per physician's orders.  Vitamin B12 1053mcg IM was administered left deltoid today. Patient tolerated injection. Prolia 60mg  SQ was administered right arm today. Patient tolerated injection.  Patient next B12 injection due: 05/21/20, appt made No Patient next Prolia injections due: 10/21/2020, appt made No

## 2020-05-01 NOTE — Progress Notes (Signed)
Pt with vit B12 deficiency.  Agree with vit B12 1000 mcg IM in office today. Pt also with osteoporosis and treatment with prolia injection appropriate to give in office today. Signed:  Crissie Sickles, MD           05/01/2020

## 2020-05-21 ENCOUNTER — Other Ambulatory Visit: Payer: Self-pay

## 2020-05-21 ENCOUNTER — Ambulatory Visit (INDEPENDENT_AMBULATORY_CARE_PROVIDER_SITE_OTHER): Payer: Medicare PPO

## 2020-05-21 DIAGNOSIS — E538 Deficiency of other specified B group vitamins: Secondary | ICD-10-CM

## 2020-05-21 MED ORDER — CYANOCOBALAMIN 1000 MCG/ML IJ SOLN
1000.0000 ug | Freq: Once | INTRAMUSCULAR | Status: AC
  Administered 2020-05-21: 1000 ug via INTRAMUSCULAR

## 2020-05-21 NOTE — Progress Notes (Signed)
Karen Delacruz a 79 y.o.femalepresents to the office today for Vitamin B12 per physician's orders.  Vitamin B12 1076mcg IMwas administered R deltoidtoday. Patient tolerated injection.   Patient nextB12injection due:06/21/20, appt madeNo  Caroll Rancher LPN

## 2020-05-26 NOTE — Progress Notes (Signed)
Pt with vit B12 deficiency.  Agree with vit B12 1000 mcg IM in office today. Signed:  Crissie Sickles, MD           05/26/2020

## 2020-06-16 DIAGNOSIS — Z20828 Contact with and (suspected) exposure to other viral communicable diseases: Secondary | ICD-10-CM | POA: Diagnosis not present

## 2020-06-18 ENCOUNTER — Telehealth: Payer: Self-pay

## 2020-06-18 ENCOUNTER — Ambulatory Visit: Payer: Medicare PPO

## 2020-06-18 NOTE — Telephone Encounter (Signed)
Patient rescheduled NV for after COVID result.    [facility] - Client TELEPHONE ADVICE RECORD [facility] Patient Name: Karen Delacruz Gender: Female DOB: 29-Aug-1941 Age: 79 Y 31 M 28 D Return Phone Number: [phone] (Primary), [phone] (Secondary) Address: [place] [address] Client [place] [facility] - Client Client Site [facility] - Day Physician Crissie Sickles - MD Contact Type Call Who Is Calling Patient / Member / Family / Caregiver Call Type Triage / Clinical Caller Name Brooklin Rieger Relationship To Patient Grandchild Return Phone Number [phone] (Primary) Chief Complaint Health information question (non symptomatic) Reason for Call Symptomatic / Request for Alpha states his grandma is quarantined until her test results come back per email where they took the test at. Caller states she has no symptoms and tomorrow she has an appointment to get her B12 shots. Translation No Nurse Assessment Nurse: Luvenia Starch, RN, Kayla Date/Time (Eastern Time): 06/17/2020 9:21:04 PM Confirm and document reason for call. If symptomatic, describe symptoms. ---Caller reports the pt went to get a COVID test just to be safe before leaving for vacation. She received an email advising for her to quarantine until she gets her results which can take 2-3 days. Pt was tested today and is supposed to come to the office tomorrow to receive her B12 shot. Caller reports pt has not been having any symptoms and has not been directly exposed to anyone that has been sick but was just being cautious due to the rising number of people with COVID now. Caller is needing to know if she has to reschedule her shot or if she can still come to the office tomorrow. Appt is at 0900. Has the patient had close contact with a person known or suspected to have the novel coronavirus illness OR traveled  / lives in area with major community spread (including international travel) in the last 14 days from the onset of symptoms? * If Asymptomatic, screen for exposure and travel within the last 14 days. ---Yes Does the patient have any new or worsening symptoms? ---No Please document clinical information provided and list any resource used. ---Advised caller that this would depend upon the clinics policy regarding pending COVID tests and pts coming into the office. Advised caller to be certain that she can still come to get her shot in the morning they should call the office at 0800 in the morning to be advised further regarding this question. Caller stated understanding. Disp. Time Eilene Ghazi Time) Disposition Final User PLEASE NOTE: All timestamps contained within this report are represented as [place] Standard Time. CONFIDENTIALTY NOTICE: This fax transmission is intended only for the addressee. It contains information that is legally privileged, confidential or otherwise protected from use or disclosure. If you are not the intended recipient, you are strictly prohibited from reviewing, disclosing, copying using or disseminating any of this information or taking any action in reliance on or regarding this information. If you have received this fax in error, please notify us immediately by telephone so that we can arrange for its return to [place]. Phone: [phone], Toll-Free: [phone], Fax: [phone] Page: 2 of 2 Call Id: [id number] 06/17/2020 9:27:20 PM Clinical Call Yes Luvenia Starch, RN, Lonn [place]

## 2020-06-20 ENCOUNTER — Ambulatory Visit (INDEPENDENT_AMBULATORY_CARE_PROVIDER_SITE_OTHER): Payer: Medicare PPO | Admitting: Family Medicine

## 2020-06-20 ENCOUNTER — Other Ambulatory Visit: Payer: Self-pay

## 2020-06-20 DIAGNOSIS — E538 Deficiency of other specified B group vitamins: Secondary | ICD-10-CM | POA: Diagnosis not present

## 2020-06-20 MED ORDER — CYANOCOBALAMIN 1000 MCG/ML IJ SOLN
1000.0000 ug | Freq: Once | INTRAMUSCULAR | Status: AC
  Administered 2020-06-20: 1000 ug via INTRAMUSCULAR

## 2020-06-20 NOTE — Progress Notes (Signed)
Karen Antilla Crouchis a 80 y.o.femalepresents to the office today for Vitamin B12 per physician's orders.  Vitamin B12 1044mcg IMwas administered R deltoidtoday. Patient tolerated injection.   Patient nextB12injection due:07/22/20, appt [facility]

## 2020-07-11 ENCOUNTER — Other Ambulatory Visit: Payer: Self-pay

## 2020-07-11 ENCOUNTER — Ambulatory Visit: Payer: Medicare PPO | Admitting: Family Medicine

## 2020-07-11 ENCOUNTER — Encounter: Payer: Self-pay | Admitting: Family Medicine

## 2020-07-11 VITALS — BP 146/89 | HR 70 | Temp 97.6°F | Wt 152.6 lb

## 2020-07-11 DIAGNOSIS — M25512 Pain in left shoulder: Secondary | ICD-10-CM

## 2020-07-11 DIAGNOSIS — M7542 Impingement syndrome of left shoulder: Secondary | ICD-10-CM | POA: Diagnosis not present

## 2020-07-11 DIAGNOSIS — Z23 Encounter for immunization: Secondary | ICD-10-CM | POA: Diagnosis not present

## 2020-07-11 DIAGNOSIS — I83893 Varicose veins of bilateral lower extremities with other complications: Secondary | ICD-10-CM

## 2020-07-11 DIAGNOSIS — G8929 Other chronic pain: Secondary | ICD-10-CM

## 2020-07-11 DIAGNOSIS — M7582 Other shoulder lesions, left shoulder: Secondary | ICD-10-CM | POA: Diagnosis not present

## 2020-07-11 MED ORDER — MUPIROCIN 2 % EX OINT: 1.0000 "application " | TOPICAL_OINTMENT | Freq: Two times a day (BID) | CUTANEOUS | 1 refills | Status: DC

## 2020-07-11 MED ORDER — METHYLPREDNISOLONE ACETATE 80 MG/ML IJ SUSP
80.0000 mg | Freq: Once | INTRAMUSCULAR | Status: AC
  Administered 2020-07-11: 80 mg

## 2020-07-11 NOTE — Addendum Note (Signed)
Addended by: Octaviano Glow on: 07/11/2020 04:36 PM   Modules accepted: Orders

## 2020-07-11 NOTE — Progress Notes (Signed)
OFFICE VISIT  07/11/2020  CC:  Chief Complaint  Patient presents with  . Injections    left arm   HPI:    Patient is a 79 y.o. Caucasian female who presents for left shoulder pain. L shoulder hurting bad x months, getting worse, can't sleep at night, like a tooth ache, anterior shoulder up to trap area and down into deltoid.  No radiation into forearm or fingers.  No tingling or numbness or weakness. Raising arm and reaching out makes it hurt worse.  No meds tried.  Some small bumps with slight central erosion on L LL, on top of varicose veins. Not much swelling, though.  No spots on R leg.  Past Medical History:  Diagnosis Date  . Abnormal EKG 05/2016   LAFB, voltage criteria for LVH in lead aVL.  Marland Kitchen Anxiety   . Cataract    right  . Colon cancer screening 06/2017   Cologuard positive--colonoscopy showed adenomatous polyp with high grade dysplasia--recall 3 yrs.  . Cystocele, unspecified (CODE) 2018   Pessary fitted at Dr. Brynda [place]:  Milex ring with support #3--great improvement.  . Degenerative arthritis of cervical spine   . Fatty liver   . Fibromyalgia   . GERD (gastroesophageal reflux disease)   . History of hiatal hernia   . Hx: UTI (urinary tract infection)    recurrent.  Pyelo hospitalization 2012  . Idiopathic urticaria 02/2018  . Microhematuria 02/2012   Cystoscopy with bladder biopsy and cystouretograms--pathology benign.  . Myalgia    Hx of diffuse myalgias, onset in her 14s  . Osteoporosis 08/10/2016   T-score -2.5: started fosamax 07/2016.  Intol fosamax; started prolia 08/27/2016, most recent prolia was 08/30/2018. DEXA 11/2018 T score -2.3 (improved).  . Periesophageal hiatal hernia 02/2016   Type 4  . Peripheral neuropathy    bilat LLs/feetparesthesias: Dr. Posey Pronto to do NCS/EMGs as of 01/2019  . Thickened endometrium 10/16/2011   on CT.  F/u pelvic u/s showed nabothian cyst to account for this..  No enometrial pathology noted on u/s, no ovaries visualized.  .  Vertigo   . Vitamin B12 deficiency 11/2015   + LL PN    Past Surgical History:  Procedure Laterality Date  . BACK SURGERY  1990s x 2   Dr. Leeanne Deed in [place]  . CHOLECYSTECTOMY    . COLONOSCOPY  approx 2002; 08/11/17   2002; Diverticulosis, int hemorrhoids ([facility]).  2018 (for + cologuard)--adenomatous polyps with high grade dysplasia (recall 3 yrs), also diverticulosis.  . COLONOSCOPY N/A 08/11/2017   Procedure: COLONOSCOPY;  Surgeon: Rogene Houston, MD;  Location: [facility];  Service: Endoscopy;  Laterality: N/A;  7:30  . DEXA  12/06/2018   T score -2.3 (improved)->continue prolia q65mo  . ESOPHAGEAL MANOMETRY N/A 04/19/2016   Normal.  Procedure: ESOPHAGEAL MANOMETRY (EM);  Surgeon: Mauri Pole, MD;  Location: [facility];  Service: Endoscopy;  Laterality: N/A;  . EYE SURGERY Right    cataract extraction with IOL  . HYSTEROSCOPY  11/2004   Cervical hystogram   . INSERTION OF MESH N/A 06/18/2016   Procedure: INSERTION OF MESH;  Surgeon: Michael Boston, MD;  Location: [facility];  Service: General;  Laterality: N/A;  . NISSEN FUNDOPLICATION  46/65/9935  . POLYPECTOMY  08/11/2017   Procedure: POLYPECTOMY;  Surgeon: Rogene Houston, MD;  Location: [facility];  Service: Endoscopy;;  colon     Outpatient Medications Prior to Visit  Medication Sig Dispense Refill  . Ascorbic Acid (VITAMIN  C PO) Take by mouth daily.    Marland Kitchen aspirin EC 81 MG tablet Take 1 tablet (81 mg total) by mouth. Every other day    . cyanocobalamin (,VITAMIN B-12,) 1000 MCG/ML injection Inject 1 mL (1,000 mcg total) into the muscle once. 1 mL 0  . Cyanocobalamin (VITAMIN B 12 PO) Take by mouth daily.    . DULoxetine (CYMBALTA) 60 MG capsule Take 1 capsule (60 mg total) by mouth daily. 30 capsule 3  . fexofenadine (ALLEGRA) 60 MG tablet Take 1 tablet (60 mg total) by mouth 2 (two) times daily. 60 tablet 1  . fluticasone (FLONASE) 50 MCG/ACT nasal spray Place 2 sprays into both nostrils daily. 16 g 6  .  metroNIDAZOLE (METROGEL VAGINAL) 0.75 % vaginal gel Nightly x 5 nights 70 g 0  . Multiple Vitamins-Minerals (MULTIVITAMIN ADULTS) TABS Take by mouth daily.    . pantoprazole (PROTONIX) 40 MG tablet 1 tab po qd prn acid reflux 30 tablet 3  . promethazine (PHENERGAN) 25 MG tablet 1/2-1 tab po q6h prn nausea/vomiting 30 tablet 0   No facility-administered medications prior to visit.    Allergies  Allergen Reactions  . Ciprofloxacin Nausea And Vomiting  . Codeine Nausea And Vomiting and Other (See Comments)    Feels funny  . Celebrex [Celecoxib] Rash  . Penicillins Rash    Has patient had a PCN reaction causing immediate rash, facial/tongue/throat swelling, SOB or lightheadedness with hypotension: Rash Has patient had a PCN reaction causing severe rash involving mucus membranes or skin necrosis: No Has patient had a PCN reaction that required hospitalization No Has patient had a PCN reaction occurring within the last 10 years: NO If all of the above answers are "NO", then may proceed with Cephalosporin use.    ROS As per HPI  PE: Vitals with BMI 07/11/2020 03/17/2020 01/26/2020  Height - 5' 6.5" 5' 6.5"  Weight 152 lbs 10 oz 156 lbs 149 lbs  BMI - 86.5 78.46  Systolic 962 952 841  Diastolic 89 89 73  Pulse 70 72 -     Gen: Alert, well appearing.  Patient is oriented to person, place, time, and situation. AFFECT: pleasant, lucid thought and speech. She has bad L shoulder pain with any ROM. Mild trapezius and AC jt TTP.  Periacromial region diffusely tender. ER/IR/Ab and ADduction significantly painful.  Question pos drop sign on L. Pos supraspinatus and + O'brien's. Bilat LL's with some scattered small varicose veins, with 4-5 very small papules with central ulceration overlying the varicosities.  No signif erythema, no tenderness.  No drainage.  No nodules or bleeding.    LABS:    Chemistry      Component Value Date/Time   NA 140 03/17/2020 1141   K 3.8 03/17/2020 1141   CL  102 03/17/2020 1141   CO2 30 03/17/2020 1141   BUN 13 03/17/2020 1141   CREATININE 0.64 03/17/2020 1141      Component Value Date/Time   CALCIUM 10.0 03/17/2020 1141   ALKPHOS 73 03/17/2020 1141   AST 18 03/17/2020 1141   ALT 9 03/17/2020 1141   BILITOT 0.7 03/17/2020 1141       IMPRESSION AND PLAN:  1) Severe recurrent L shoulder pain: suspect she may have RC tear +/- torn labrum. She had L shoulder plain film in 2015 that showed no degenerative arthritis but I wonder if some has developed and is contributing to her pain as well. Check L shoulder x-ray. Steroid injection today.  Tylenol 1000mg  q6h prn. Recheck in 3 wks and decide if MRI and/or ortho referral needed.  Procedure: Therapeutic left shoulder injection.  The patient's clinical condition is marked by substantial pain and/or significant functional disability.  Other conservative therapy has not provided relief, is contraindicated, or not appropriate.  There is a reasonable likelihood that injection will significantly improve the patient's pain and/or functional disability. Cleaned skin with alcohol swab, used posterolateral approach, Injected 65ml of 80mg /ml depo medrol +  ml of 2 ml of plain 1% lidocaine into subacromial space without resistance.  No immediate complications.  Patient tolerated procedure well.  Post-injection care discussed, including 20 min of icing 1-2 times in the next 4-8 hours, frequent non weight-bearing ROM exercises over the next few days, and general pain medication management.  2) Varicose veins with a few areas of acute inflammation. A couple have small skin openings---will get her on bactroban ointment bid and cover these until healed.  An After Visit Summary was printed and given to the patient.  FOLLOW UP: Return in about 3 weeks (around 08/01/2020) for f/u L shoulder pain.  Signed:  Crissie Sickles, MD           07/11/2020

## 2020-07-15 ENCOUNTER — Ambulatory Visit (HOSPITAL_COMMUNITY)
Admission: RE | Admit: 2020-07-15 | Discharge: 2020-07-15 | Disposition: A | Discharge status: Home / Self Care | Payer: Medicare PPO | Source: Ambulatory Visit | Attending: Family Medicine | Admitting: Family Medicine

## 2020-07-15 ENCOUNTER — Other Ambulatory Visit: Payer: Self-pay

## 2020-07-15 DIAGNOSIS — M7542 Impingement syndrome of left shoulder: Secondary | ICD-10-CM | POA: Diagnosis not present

## 2020-07-15 DIAGNOSIS — G8929 Other chronic pain: Secondary | ICD-10-CM | POA: Diagnosis not present

## 2020-07-15 DIAGNOSIS — M25512 Pain in left shoulder: Secondary | ICD-10-CM | POA: Diagnosis not present

## 2020-07-15 DIAGNOSIS — M7582 Other shoulder lesions, left shoulder: Secondary | ICD-10-CM

## 2020-07-15 DIAGNOSIS — M19012 Primary osteoarthritis, left shoulder: Secondary | ICD-10-CM | POA: Diagnosis not present

## 2020-07-16 ENCOUNTER — Encounter: Payer: Self-pay | Admitting: Family Medicine

## 2020-07-16 ENCOUNTER — Telehealth: Payer: Self-pay

## 2020-07-16 ENCOUNTER — Other Ambulatory Visit: Payer: Self-pay | Admitting: Family Medicine

## 2020-07-16 DIAGNOSIS — M19012 Primary osteoarthritis, left shoulder: Secondary | ICD-10-CM

## 2020-07-16 DIAGNOSIS — M7542 Impingement syndrome of left shoulder: Secondary | ICD-10-CM

## 2020-07-16 DIAGNOSIS — M25512 Pain in left shoulder: Secondary | ICD-10-CM

## 2020-07-16 NOTE — Telephone Encounter (Signed)
Tried to call patient, orthopedic referral placed today on 9/22 for Dr.Dean. Unable to find any specifics in her husband's chart to see who he saw. Unable to leave message

## 2020-07-16 NOTE — Telephone Encounter (Signed)
Patient has seen Dr.Gowen for her hand issues.  She is requesting to see Ortho MD, however, she would like to go the one her husband went before and said that Dr. Anitra Lauth would know who that is.  Please call [phone]

## 2020-07-17 NOTE — Telephone Encounter (Signed)
No, Dr. Amedeo Plenty is a hand specialist. I can refer her to [facility] and a different orthopedist there will be able to see her. If that is what she prefers, pls message Hinton Dyer or Diane about it.-thx

## 2020-07-17 NOTE — Telephone Encounter (Signed)
Patient would like to see Dr.Gramig with [facility] instead. Okay for this?

## 2020-07-18 NOTE — Telephone Encounter (Signed)
Spoke with patient and advised the referral could be changed to Dr.Norris with [facility] instead of Dr.Dean. Patient understood it may take 7-10 days before someone contacts with scheduling details.

## 2020-07-21 ENCOUNTER — Ambulatory Visit (INDEPENDENT_AMBULATORY_CARE_PROVIDER_SITE_OTHER): Payer: Medicare PPO

## 2020-07-21 ENCOUNTER — Other Ambulatory Visit: Payer: Self-pay

## 2020-07-21 DIAGNOSIS — E538 Deficiency of other specified B group vitamins: Secondary | ICD-10-CM | POA: Diagnosis not present

## 2020-07-21 MED ORDER — CYANOCOBALAMIN 1000 MCG/ML IJ SOLN
1000.0000 ug | Freq: Once | INTRAMUSCULAR | Status: AC
  Administered 2020-07-21: 1000 ug via INTRAMUSCULAR

## 2020-07-21 NOTE — Progress Notes (Signed)
  Karen Delacruz a 79 y.o.femalepresents to the office today for Vitamin B12 per physician's orders.  Vitamin B12 1068mcg IMwas administeredRdeltoidtoday. Patient tolerated injection.   Patient nextB12injection due:08/20/20, appt madeyes   [facility], CMA

## 2020-07-24 ENCOUNTER — Encounter (INDEPENDENT_AMBULATORY_CARE_PROVIDER_SITE_OTHER): Payer: Self-pay | Admitting: *Deleted

## 2020-08-01 ENCOUNTER — Ambulatory Visit: Payer: Medicare PPO | Admitting: Family Medicine

## 2020-08-20 ENCOUNTER — Ambulatory Visit (INDEPENDENT_AMBULATORY_CARE_PROVIDER_SITE_OTHER): Payer: Medicare PPO

## 2020-08-20 ENCOUNTER — Other Ambulatory Visit: Payer: Self-pay

## 2020-08-20 DIAGNOSIS — E538 Deficiency of other specified B group vitamins: Secondary | ICD-10-CM | POA: Diagnosis not present

## 2020-08-20 MED ORDER — CYANOCOBALAMIN 1000 MCG/ML IJ SOLN
1000.0000 ug | Freq: Once | INTRAMUSCULAR | Status: AC
  Administered 2020-08-20: 1000 ug via INTRAMUSCULAR

## 2020-08-20 NOTE — Progress Notes (Signed)
Karen Wayne Crouchis a 79 y.o.femalepresents to the office today for Vitamin B12 per physician's orders.  Vitamin B12 1062mcg IMwas administeredRdeltoidtoday. Patient tolerated injection.   Patient nextB12injection due:09/20/20, appt madeyes

## 2020-08-28 DIAGNOSIS — M25512 Pain in left shoulder: Secondary | ICD-10-CM | POA: Diagnosis not present

## 2020-09-02 NOTE — Progress Notes (Signed)
Subjective:   Karen Delacruz is a 79 y.o. female who presents for Medicare Annual (Subsequent) preventive examination.   I connected with Karen Delacruz today by telephone and verified that I am speaking with the correct person using two identifiers. Location patient: home Location provider: work Persons participating in the virtual visit: patient, Marine scientist.    I discussed the limitations, risks, security and privacy concerns of performing an evaluation and management service by telephone and the availability of in person appointments. I also discussed with the patient that there may be a patient responsible charge related to this service. The patient expressed understanding and verbally consented to this telephonic visit.    Interactive audio and video telecommunications were attempted between this provider and patient, however failed, due to patient having technical difficulties OR patient did not have access to video capability.  We continued and completed visit with audio only.  Some vital signs may be absent or patient reported.   Time Spent with patient on telephone encounter: 20 minutes  Review of Systems     Cardiac Risk Factors include: advanced age (>26men, >19 women);dyslipidemia     Objective:    Today's Vitals   09/03/20 1116  Weight: 152 lb (68.9 kg)  Height: 5' 6.5" (1.689 m)   Body mass index is 24.17 kg/m.  Advanced Directives 09/03/2020 08/11/2017 06/02/2017 06/18/2016 06/11/2016 03/01/2012 10/15/2011  Does Patient Have a Medical Advance Directive? No No No No No Patient does not have advance directive Patient does not have advance directive;Patient would like information  Would patient like information on creating a medical advance directive? Yes (MAU/Ambulatory/Procedural Areas - Information given) No - Patient declined No - Patient declined No - patient declined information No - patient declined information - Advance directive packet given  Pre-existing out of facility DNR  order (yellow form or pink MOST form) - - - - - - No    Current Medications (verified) Outpatient Encounter Medications as of 09/03/2020  Medication Sig  . Ascorbic Acid (VITAMIN C PO) Take by mouth daily.  Marland Kitchen aspirin EC 81 MG tablet Take 1 tablet (81 mg total) by mouth. Every other day  . cyanocobalamin (,VITAMIN B-12,) 1000 MCG/ML injection Inject 1 mL (1,000 mcg total) into the muscle once.  . Cyanocobalamin (VITAMIN B 12 PO) Take by mouth daily.  . DULoxetine (CYMBALTA) 60 MG capsule Take 1 capsule (60 mg total) by mouth daily.  . fexofenadine (ALLEGRA) 60 MG tablet Take 1 tablet (60 mg total) by mouth 2 (two) times daily.  . fluticasone (FLONASE) 50 MCG/ACT nasal spray Place 2 sprays into both nostrils daily.  . metroNIDAZOLE (METROGEL VAGINAL) 0.75 % vaginal gel Nightly x 5 nights  . Multiple Vitamins-Minerals (MULTIVITAMIN ADULTS) TABS Take by mouth daily.  . mupirocin ointment (BACTROBAN) 2 % Apply 1 application topically 2 (two) times daily.  . pantoprazole (PROTONIX) 40 MG tablet 1 tab po qd prn acid reflux  . promethazine (PHENERGAN) 25 MG tablet 1/2-1 tab po q6h prn nausea/vomiting   No facility-administered encounter medications on file as of 09/03/2020.    Allergies (verified) Ciprofloxacin, Codeine, Celebrex [celecoxib], and Penicillins   History: Past Medical History:  Diagnosis Date  . Abnormal EKG 05/2016   LAFB, voltage criteria for LVH in lead aVL.  Marland Kitchen Anxiety   . Cataract    right  . Chronic left shoulder pain    RC impingement + osteoarthritis  . Colon cancer screening 06/2017   Cologuard positive--colonoscopy showed adenomatous polyp with high grade  dysplasia--recall 3 yrs.  . Cystocele, unspecified (CODE) 2018   Pessary fitted at Dr. Brynda [place]athouse:  Milex ring with support #3--great improvement.  . Degenerative arthritis of cervical spine   . Fatty liver   . Fibromyalgia   . GERD (gastroesophageal reflux disease)   . History of hiatal hernia   . Hx: UTI  (urinary tract infection)    recurrent.  Pyelo hospitalization 2012  . Idiopathic urticaria 02/2018  . Microhematuria 02/2012   Cystoscopy with bladder biopsy and cystouretograms--pathology benign.  . Myalgia    Hx of diffuse myalgias, onset in her 72s  . Osteoarthritis of left shoulder    Recurrent L shoulder pain  . Osteoporosis 08/10/2016   T-score -2.5: started fosamax 07/2016.  Intol fosamax; started prolia 08/27/2016, most recent prolia was 08/30/2018. DEXA 11/2018 T score -2.3 (improved).  . Periesophageal hiatal hernia 02/2016   Type 4  . Peripheral neuropathy    bilat LLs/feetparesthesias: Dr. Posey Pronto to do NCS/EMGs as of 01/2019  . Thickened endometrium 10/16/2011   on CT.  F/u pelvic u/s showed nabothian cyst to account for this..  No enometrial pathology noted on u/s, no ovaries visualized.  . Vertigo   . Vitamin B12 deficiency 11/2015   + LL PN   Past Surgical History:  Procedure Laterality Date  . BACK SURGERY  1990s x 2   Dr. Leeanne Deed in [place]  . CHOLECYSTECTOMY    . COLONOSCOPY  approx 2002; 08/11/17   2002; Diverticulosis, int hemorrhoids ([facility]).  2018 (for + cologuard)--adenomatous polyps with high grade dysplasia (recall 3 yrs), also diverticulosis.  . COLONOSCOPY N/A 08/11/2017   Procedure: COLONOSCOPY;  Surgeon: Rogene Houston, MD;  Location: [facility];  Service: Endoscopy;  Laterality: N/A;  7:30  . DEXA  12/06/2018   T score -2.3 (improved)->continue prolia q89mo  . ESOPHAGEAL MANOMETRY N/A 04/19/2016   Normal.  Procedure: ESOPHAGEAL MANOMETRY (EM);  Surgeon: Mauri Pole, MD;  Location: [facility];  Service: Endoscopy;  Laterality: N/A;  . EYE SURGERY Right    cataract extraction with IOL  . HYSTEROSCOPY  11/2004   Cervical hystogram   . INSERTION OF MESH N/A 06/18/2016   Procedure: INSERTION OF MESH;  Surgeon: Michael Boston, MD;  Location: [facility];  Service: General;  Laterality: N/A;  . NISSEN FUNDOPLICATION  24/23/5361  . POLYPECTOMY   08/11/2017   Procedure: POLYPECTOMY;  Surgeon: Rogene Houston, MD;  Location: [facility];  Service: Endoscopy;;  colon    Family History  Problem Relation Age of Onset  . Cancer Mother        bladder  . Other Father        had bladder issues   Social History   Socioeconomic History  . Marital status: Married    Spouse name: Not on file  . Number of children: Not on file  . Years of education: Not on file  . Highest education level: Not on file  Occupational History  . Not on file  Tobacco Use  . Smoking status: Never Smoker  . Smokeless tobacco: Never Used  Vaping Use  . Vaping Use: Never used  Substance and Sexual Activity  . Alcohol use: No  . Drug use: No  . Sexual activity: Yes    Birth control/protection: Post-menopausal  Other Topics Concern  . Not on file  Social History Narrative   Married, lives with husband in [place].  Has two grown children.   Retired from [employer], then un-retired  to go back to same job--full time.   No t/a/d.   No formal exercise.   Normal american diet.   Social Determinants of Health   Financial Resource Strain: Low Risk   . Difficulty of Paying Living Expenses: Not hard at all  Food Insecurity: No Food Insecurity  . Worried About Charity fundraiser in the Last Year: Never true  . Ran Out of Food in the Last Year: Never true  Transportation Needs: No Transportation Needs  . Lack of Transportation (Medical): No  . Lack of Transportation (Non-Medical): No  Physical Activity: Insufficiently Active  . Days of Exercise per Week: 4 days  . Minutes of Exercise per Session: 20 min  Stress: No Stress Concern Present  . Feeling of Stress : Not at all  Social Connections: Moderately Integrated  . Frequency of Communication with Friends and Family: More than three times a week  . Frequency of Social Gatherings with Friends and Family: More than three times a week  . Attends Religious Services: More than 4 times per year    . Active Member of Clubs or Organizations: No  . Attends Archivist Meetings: Never  . Marital Status: Married    Tobacco Counseling Counseling given: Not Answered   Clinical Intake:  Pre-visit preparation completed: Yes  Pain : No/denies pain     Nutritional Status: BMI of 19-24  Normal Nutritional Risks: None Diabetes: No  How often do you need to have someone help you when you read instructions, pamphlets, or other written materials from your doctor or pharmacy?: 1 - Never What is the last grade level you completed in school?: 9th grade  Diabetic?No  Interpreter Needed?: No  Information entered by :: Caroleen Hamman LPN   Activities of Daily Living In your present state of health, do you have any difficulty performing the following activities: 09/03/2020  Hearing? N  Vision? N  Difficulty concentrating or making decisions? N  Walking or climbing stairs? N  Dressing or bathing? N  Doing errands, shopping? N  Preparing Food and eating ? N  Using the Toilet? N  In the past six months, have you accidently leaked urine? N  Do you have problems with loss of bowel control? N  Managing your Medications? N  Managing your Finances? N  Housekeeping or managing your Housekeeping? N  Some recent data might be hidden    Patient Care Team: Tammi Sou, MD as PCP - General (Family Medicine) Michael Boston, MD as Consulting Physician (General Surgery) Mauri Pole, MD as Consulting Physician (Gastroenterology) Florian Buff, MD as Consulting Physician (Obstetrics and Gynecology) Rogene Houston, MD as Consulting Physician (Gastroenterology) Alda Berthold, DO as Consulting Physician (Neurology)  Indicate any recent Medical Services you may have received from other than [facility] providers in the past year (date may be approximate).     Assessment:   This is a routine wellness examination for Sellers.  Hearing/Vision screen  Hearing Screening    125Hz  250Hz  500Hz  1000Hz  2000Hz  3000Hz  4000Hz  6000Hz  8000Hz   Right ear:           Left ear:           Comments: No issues  Vision Screening Comments: Reading glasses Last eye exam-05/2020-unsure of name  Dietary issues and exercise activities discussed: Current Exercise Habits: Home exercise routine, Type of exercise: Other - see comments (stationary bike), Time (Minutes): 20, Frequency (Times/Week): 3, Weekly Exercise (Minutes/Week): 60, Exercise limited by: None identified  Goals    . Patient Stated     Maintain current health      Depression Screen PHQ 2/9 Scores 09/03/2020 01/26/2020 11/14/2019 11/09/2018 09/01/2018 06/02/2017 08/04/2016  PHQ - 2 Score 0 0 1 0 0 0 0    Fall Risk Fall Risk  09/03/2020 01/26/2020 11/14/2019 11/09/2018 09/01/2018  Falls in the past year? 0 0 0 0 0  Number falls in past yr: 0 - 0 - -  Injury with Fall? 0 - 0 - -  Follow up Falls prevention discussed - Falls evaluation completed - -    Any stairs in or around the home? Yes  If so, are there any without handrails? No  Home free of loose throw rugs in walkways, pet beds, electrical cords, etc? Yes  Adequate lighting in your home to reduce risk of falls? Yes   ASSISTIVE DEVICES UTILIZED TO PREVENT FALLS:  Life alert? No  Use of a cane, walker or w/c? No  Grab bars in the bathroom? Yes  Shower chair or bench in shower? No  Elevated toilet seat or a handicapped toilet? No   TIMED UP AND GO:  Was the test performed? No . Phone visit  Cognitive Function:No cognitive impairment noted     6CIT Screen 09/03/2020  What Year? 0 points  What month? 0 points  What time? 0 points  Count back from 20 0 points  Months in reverse 4 points  Repeat phrase 0 points  Total Score 4    Immunizations Immunization History  Administered Date(s) Administered  . Fluad Quad(high Dose 65+) 08/14/2019, 07/11/2020  . Influenza Split 07/31/2012  . Influenza, High Dose Seasonal PF 07/31/2015, 08/04/2016, 07/29/2017,  07/17/2018  . Influenza,inj,Quad PF,6+ Mos 07/17/2014  . PFIZER SARS-COV-2 Vaccination 01/31/2020, 02/22/2020  . Pneumococcal Conjugate-13 08/21/2014  . Pneumococcal Polysaccharide-23 12/19/2015  . Tdap 07/31/2012  . Zoster Recombinat (Shingrix) 01/05/2018, 03/28/2018    TDAP status: Up to date   Flu Vaccine status: Up to date   Pneumococcal vaccine status: Up to date   Covid-19 vaccine status: Completed vaccines  Qualifies for Shingles Vaccine? No   Zostavax completed No   Shingrix Completed?: Yes  Screening Tests Health Maintenance  Topic Date Due  . Hepatitis C Screening  Never done  . COLONOSCOPY  08/11/2020  . TETANUS/TDAP  07/31/2022  . INFLUENZA VACCINE  Completed  . DEXA SCAN  Completed  . COVID-19 Vaccine  Completed  . PNA vac Low Risk Adult  Completed    Health Maintenance  Health Maintenance Due  Topic Date Due  . Hepatitis C Screening  Never done  . COLONOSCOPY  08/11/2020    Colorectal cancer screening: No longer required.    Mammogram status: Due- Patient wishes to call & schedule herself.  Bone Density status: Completed 12/06/2018. Results reflect: Bone density results: OSTEOPENIA. Repeat every 2 years.  Lung Cancer Screening: (Low Dose CT Chest recommended if Age 56-80 years, 30 pack-year currently smoking OR have quit w/in 15years.) does not qualify.     Additional Screening:  Hepatitis C Screening: does not qualify  Vision Screening: Recommended annual ophthalmology exams for early detection of glaucoma and other disorders of the eye. Is the patient up to date with their annual eye exam?  Yes  Who is the provider or what is the name of the office in which the patient attends annual eye exams? Unsure of name   Dental Screening: Recommended annual dental exams for proper oral hygiene  Community Resource Referral /  Chronic Care Management: CRR required this visit?  No   CCM required this visit?  No      Plan:     I have personally  reviewed and noted the following in the patient's chart:   . Medical and social history . Use of alcohol, tobacco or illicit drugs  . Current medications and supplements . Functional ability and status . Nutritional status . Physical activity . Advanced directives . List of other physicians . Hospitalizations, surgeries, and ER visits in previous 12 months . Vitals . Screenings to include cognitive, depression, and falls . Referrals and appointments  In addition, I have reviewed and discussed with patient certain preventive protocols, quality metrics, and best practice recommendations. A written personalized care plan for preventive services as well as general preventive health recommendations were provided to patient.   Due to this being a telephonic visit, the after visit summary with patients personalized plan was offered to patient via mail or my-chart.  Patient would like to access on my-chart.   Marta Antu, LPN   02/54/2706  Nurse Health Advisor  Nurse Notes: None

## 2020-09-03 ENCOUNTER — Ambulatory Visit (INDEPENDENT_AMBULATORY_CARE_PROVIDER_SITE_OTHER): Payer: Medicare PPO

## 2020-09-03 VITALS — Ht 66.5 in | Wt 152.0 lb

## 2020-09-03 DIAGNOSIS — Z0000 Encounter for general adult medical examination without abnormal findings: Secondary | ICD-10-CM | POA: Diagnosis not present

## 2020-09-03 NOTE — Patient Instructions (Signed)
Karen Delacruz , Thank you for taking time to complete your Medicare Wellness Visit. I appreciate your ongoing commitment to your health goals. Please review the following plan we discussed and let me know if I can assist you in the future.   Screening recommendations/referrals: Colonoscopy: No longer required Mammogram: Due- Per our conversation, you will call & schedule. Please call the office if you need assistance scheduling. Bone Density: Completed 12/06/2018- Due-12/06/2020 Recommended yearly ophthalmology/optometry visit for glaucoma screening and checkup Recommended yearly dental visit for hygiene and checkup  Vaccinations: Influenza vaccine: Up to date Pneumococcal vaccine: Completed vaccines Tdap vaccine: Up to date- Due-07/31/2022 Shingles vaccine: Completed vaccines  Covid-19:Completed vaccines  Advanced directives: Information mailed today  Conditions/risks identified: See problem list  Next appointment: Follow up in one year for your annual wellness visit 09/09/2021 @ 11:15   Preventive Care 78 Years and Older, Female Preventive care refers to lifestyle choices and visits with your health care provider that can promote health and wellness. What does preventive care include?  A yearly physical exam. This is also called an annual well check.  Dental exams once or twice a year.  Routine eye exams. Ask your health care provider how often you should have your eyes checked.  Personal lifestyle choices, including:  Daily care of your teeth and gums.  Regular physical activity.  Eating a healthy diet.  Avoiding tobacco and drug use.  Limiting alcohol use.  Practicing safe sex.  Taking low-dose aspirin every day.  Taking vitamin and mineral supplements as recommended by your health care provider. What happens during an annual well check? The services and screenings done by your health care provider during your annual well check will depend on your age, overall  health, lifestyle risk factors, and family history of disease. Counseling  Your health care provider may ask you questions about your:  Alcohol use.  Tobacco use.  Drug use.  Emotional well-being.  Home and relationship well-being.  Sexual activity.  Eating habits.  History of falls.  Memory and ability to understand (cognition).  Work and work Statistician.  Reproductive health. Screening  You may have the following tests or measurements:  Height, weight, and BMI.  Blood pressure.  Lipid and cholesterol levels. These may be checked every 5 years, or more frequently if you are over 78 years old.  Skin check.  Lung cancer screening. You may have this screening every year starting at age 50 if you have a 30-pack-year history of smoking and currently smoke or have quit within the past 15 years.  Fecal occult blood test (FOBT) of the stool. You may have this test every year starting at age 78.  Flexible sigmoidoscopy or colonoscopy. You may have a sigmoidoscopy every 5 years or a colonoscopy every 10 years starting at age 78.  Hepatitis C blood test.  Hepatitis B blood test.  Sexually transmitted disease (STD) testing.  Diabetes screening. This is done by checking your blood sugar (glucose) after you have not eaten for a while (fasting). You may have this done every 1-3 years.  Bone density scan. This is done to screen for osteoporosis. You may have this done starting at age 78.  Mammogram. This may be done every 1-2 years. Talk to your health care provider about how often you should have regular mammograms. Talk with your health care provider about your test results, treatment options, and if necessary, the need for more tests. Vaccines  Your health care provider may recommend certain vaccines, such  as:  Influenza vaccine. This is recommended every year.  Tetanus, diphtheria, and acellular pertussis (Tdap, Td) vaccine. You may need a Td booster every 10  years.  Zoster vaccine. You may need this after age 78.  Pneumococcal 13-valent conjugate (PCV13) vaccine. One dose is recommended after age 78.  Pneumococcal polysaccharide (PPSV23) vaccine. One dose is recommended after age 78. Talk to your health care provider about which screenings and vaccines you need and how often you need them. This information is not intended to replace advice given to you by your health care provider. Make sure you discuss any questions you have with your health care provider. Document Released: 11/07/2015 Document Revised: 06/30/2016 Document Reviewed: 08/12/2015 Elsevier Interactive Patient Education  2017 [place] Prevention in the Home Falls can cause injuries. They can happen to people of all ages. There are many things you can do to make your home safe and to help prevent falls. What can I do on the outside of my home?  Regularly fix the edges of walkways and driveways and fix any cracks.  Remove anything that might make you trip as you walk through a door, such as a raised step or threshold.  Trim any bushes or trees on the path to your home.  Use bright outdoor lighting.  Clear any walking paths of anything that might make someone trip, such as rocks or tools.  Regularly check to see if handrails are loose or broken. Make sure that both sides of any steps have handrails.  Any raised decks and porches should have guardrails on the edges.  Have any leaves, snow, or ice cleared regularly.  Use sand or salt on walking paths during winter.  Clean up any spills in your garage right away. This includes oil or grease spills. What can I do in the bathroom?  Use night lights.  Install grab bars by the toilet and in the tub and shower. Do not use towel bars as grab bars.  Use non-skid mats or decals in the tub or shower.  If you need to sit down in the shower, use a plastic, non-slip stool.  Keep the floor dry. Clean up any water that  spills on the floor as soon as it happens.  Remove soap buildup in the tub or shower regularly.  Attach bath mats securely with double-sided non-slip rug tape.  Do not have throw rugs and other things on the floor that can make you trip. What can I do in the bedroom?  Use night lights.  Make sure that you have a light by your bed that is easy to reach.  Do not use any sheets or blankets that are too big for your bed. They should not hang down onto the floor.  Have a firm chair that has side arms. You can use this for support while you get dressed.  Do not have throw rugs and other things on the floor that can make you trip. What can I do in the kitchen?  Clean up any spills right away.  Avoid walking on wet floors.  Keep items that you use a lot in easy-to-reach places.  If you need to reach something above you, use a strong step stool that has a grab bar.  Keep electrical cords out of the way.  Do not use floor polish or wax that makes floors slippery. If you must use wax, use non-skid floor wax.  Do not have throw rugs and other things on the  floor that can make you trip. What can I do with my stairs?  Do not leave any items on the stairs.  Make sure that there are handrails on both sides of the stairs and use them. Fix handrails that are broken or loose. Make sure that handrails are as long as the stairways.  Check any carpeting to make sure that it is firmly attached to the stairs. Fix any carpet that is loose or worn.  Avoid having throw rugs at the top or bottom of the stairs. If you do have throw rugs, attach them to the floor with carpet tape.  Make sure that you have a light switch at the top of the stairs and the bottom of the stairs. If you do not have them, ask someone to add them for you. What else can I do to help prevent falls?  Wear shoes that:  Do not have high heels.  Have rubber bottoms.  Are comfortable and fit you well.  Are closed at the  toe. Do not wear sandals.  If you use a stepladder:  Make sure that it is fully opened. Do not climb a closed stepladder.  Make sure that both sides of the stepladder are locked into place.  Ask someone to hold it for you, if possible.  Clearly mark and make sure that you can see:  Any grab bars or handrails.  First and last steps.  Where the edge of each step is.  Use tools that help you move around (mobility aids) if they are needed. These include:  Canes.  Walkers.  Scooters.  Crutches.  Turn on the lights when you go into a dark area. Replace any light bulbs as soon as they burn out.  Set up your furniture so you have a clear path. Avoid moving your furniture around.  If any of your floors are uneven, fix them.  If there are any pets around you, be aware of where they are.  Review your medicines with your doctor. Some medicines can make you feel dizzy. This can increase your chance of falling. Ask your doctor what other things that you can do to help prevent falls. This information is not intended to replace advice given to you by your health care provider. Make sure you discuss any questions you have with your health care provider. Document Released: 08/07/2009 Document Revised: 03/18/2016 Document Reviewed: 11/15/2014 Elsevier Interactive Patient Education  2017 [employer].

## 2020-09-17 ENCOUNTER — Ambulatory Visit (INDEPENDENT_AMBULATORY_CARE_PROVIDER_SITE_OTHER): Payer: Medicare PPO

## 2020-09-17 ENCOUNTER — Other Ambulatory Visit: Payer: Self-pay

## 2020-09-17 DIAGNOSIS — E538 Deficiency of other specified B group vitamins: Secondary | ICD-10-CM

## 2020-09-17 MED ORDER — CYANOCOBALAMIN 1000 MCG/ML IJ SOLN
1000.0000 ug | Freq: Once | INTRAMUSCULAR | Status: AC
  Administered 2020-09-17: 1000 ug via INTRAMUSCULAR

## 2020-09-17 NOTE — Progress Notes (Signed)
Pt here today for monthly B12 shot. Shot was given with no issue. Rx provided by office.    

## 2020-09-22 DIAGNOSIS — M25512 Pain in left shoulder: Secondary | ICD-10-CM | POA: Diagnosis not present

## 2020-10-06 ENCOUNTER — Telehealth: Payer: Self-pay | Admitting: Family Medicine

## 2020-10-06 NOTE — Telephone Encounter (Signed)
Pt is due for prolia injection on 10/21/20.

## 2020-10-06 NOTE — Telephone Encounter (Signed)
Patient would like to get her Prolia at her upcoming nurse appt 10/22/20. Please advise if this is approved.

## 2020-10-07 DIAGNOSIS — M75122 Complete rotator cuff tear or rupture of left shoulder, not specified as traumatic: Secondary | ICD-10-CM | POA: Diagnosis not present

## 2020-10-07 DIAGNOSIS — M19012 Primary osteoarthritis, left shoulder: Secondary | ICD-10-CM | POA: Diagnosis not present

## 2020-10-07 DIAGNOSIS — M75120 Complete rotator cuff tear or rupture of unspecified shoulder, not specified as traumatic: Secondary | ICD-10-CM | POA: Insufficient documentation

## 2020-10-21 ENCOUNTER — Other Ambulatory Visit: Payer: Self-pay

## 2020-10-22 ENCOUNTER — Ambulatory Visit (INDEPENDENT_AMBULATORY_CARE_PROVIDER_SITE_OTHER): Payer: Medicare PPO

## 2020-10-22 DIAGNOSIS — M810 Age-related osteoporosis without current pathological fracture: Secondary | ICD-10-CM

## 2020-10-22 DIAGNOSIS — E538 Deficiency of other specified B group vitamins: Secondary | ICD-10-CM

## 2020-10-22 MED ORDER — DENOSUMAB 60 MG/ML ~~LOC~~ SOSY
60.0000 mg | PREFILLED_SYRINGE | Freq: Once | SUBCUTANEOUS | Status: AC
  Administered 2020-10-22: 60 mg via SUBCUTANEOUS

## 2020-10-22 MED ORDER — CYANOCOBALAMIN 1000 MCG/ML IJ SOLN
1000.0000 ug | Freq: Once | INTRAMUSCULAR | Status: AC
  Administered 2020-10-22: 1000 ug via INTRAMUSCULAR

## 2020-10-22 NOTE — Progress Notes (Signed)
Karen Delacruz Crouchis a 79 y.o.femalepresents to the office today for Vitamin B12 per physician's orders.  Vitamin B12 IMwas administeredleftdeltoidtoday. Patient tolerated injection.  Pt also with osteoporosis and treatment with prolia injection appropriate to give in office today.

## 2020-10-23 ENCOUNTER — Encounter: Payer: Self-pay | Admitting: Family Medicine

## 2020-11-14 ENCOUNTER — Telehealth (INDEPENDENT_AMBULATORY_CARE_PROVIDER_SITE_OTHER): Payer: Medicare PPO | Admitting: Family Medicine

## 2020-11-14 DIAGNOSIS — R059 Cough, unspecified: Secondary | ICD-10-CM

## 2020-11-14 DIAGNOSIS — R0981 Nasal congestion: Secondary | ICD-10-CM | POA: Diagnosis not present

## 2020-11-14 MED ORDER — BENZONATATE 100 MG PO CAPS: 100.0000 mg | ORAL_CAPSULE | Freq: Three times a day (TID) | ORAL | 0 refills | Status: DC | PRN

## 2020-11-14 NOTE — Progress Notes (Signed)
Virtual Visit via Video Note  I connected with Karen Delacruz  on 11/14/20 at  1:00 PM EST by a video enabled telemedicine application and verified that I am speaking with the correct person using two identifiers.  Location patient: home, [place] Location provider:work or home office Persons participating in the virtual visit: patient, provider, patient's daughter  I discussed the limitations of evaluation and management by telemedicine and the availability of in person appointments. The patient expressed understanding and agreed to proceed.   HPI:  Acute telemedicine visit for : -Onset: 11/11/2020 -Symptoms include: sore throat, headache, sinus congestion, mild cough, PND, some body aches -Denies: fevers, CP, SOB, vomiting or diarrhea, loss of taste, inability to eat/drink/get out of bed, known sick contacts -Has tried: has tried flonase, tylenol yesterday -Pertinent past medical history: allergic rhinitis, sinusitis -Pertinent medication allergies: codeine, celebrex, penicillin -COVID-19 vaccine status: fully vaccinated; had flu shot this year as well  ROS: See pertinent positives and negatives per HPI.  Past Medical History:  Diagnosis Date   Abnormal EKG 05/2016   LAFB, voltage criteria for LVH in lead aVL.   Anxiety    Cataract    right   Chronic left shoulder pain    RC impingement + osteoarthritis. as of 09/2020 ortho eval, surg recommended but pt holding off, got steroid inj   Colon cancer screening 06/2017   Cologuard positive--colonoscopy showed adenomatous polyp with high grade dysplasia--recall 3 yrs.   Cystocele, unspecified (CODE) 2018   Pessary fitted at Dr. Brynda Greathouse:  Milex ring with support #3--great improvement.   Degenerative arthritis of cervical spine    Fatty liver    Fibromyalgia    GERD (gastroesophageal reflux disease)    History of hiatal hernia    Hx: UTI (urinary tract infection)    recurrent.  Pyelo hospitalization 2012   Idiopathic urticaria  02/2018   Microhematuria 02/2012   Cystoscopy with bladder biopsy and cystouretograms--pathology benign.   Myalgia    Hx of diffuse myalgias, onset in her 60s   Osteoarthritis of left shoulder    Recurrent L shoulder pain   Osteoporosis 08/10/2016   T-score -2.5: started fosamax 07/2016.  Intol fosamax; started prolia 08/27/2016, most recent prolia was 08/30/2018. DEXA 11/2018 T score -2.3 (improved).   Periesophageal hiatal hernia 02/2016   Type 4   Peripheral neuropathy    bilat LLs/feetparesthesias: Dr. Posey Pronto to do NCS/EMGs as of 01/2019   Thickened endometrium 10/16/2011   on CT.  F/u pelvic u/s showed nabothian cyst to account for this..  No enometrial pathology noted on u/s, no ovaries visualized.   Vertigo    Vitamin B12 deficiency 11/2015   + LL PN    Past Surgical History:  Procedure Laterality Date   BACK SURGERY  1990s x 2   Dr. Leeanne Deed in [place]  approx 2002; 08/11/17   2002; Diverticulosis, int hemorrhoids ([facility]).  2018 (for + cologuard)--adenomatous polyps with high grade dysplasia (recall 3 yrs), also diverticulosis.   COLONOSCOPY N/A 08/11/2017   Procedure: COLONOSCOPY;  Surgeon: Rogene Houston, MD;  [facility];  Service: Endoscopy;  Laterality: N/A;  7:30   DEXA  12/06/2018   T score -2.3 (improved)->continue prolia q39mo   ESOPHAGEAL MANOMETRY N/A 04/19/2016   Normal.  Procedure: ESOPHAGEAL MANOMETRY (EM);  Surgeon: Mauri Pole, MD;  [facility]: [facility];  Service: Endoscopy;  Laterality: N/A;   EYE SURGERY Right    cataract extraction with IOL  HYSTEROSCOPY  11/2004   Cervical hystogram    INSERTION OF MESH N/A 06/18/2016   Procedure: INSERTION OF MESH;  Surgeon: Michael Boston, MD;  Location: [facility];  Service: General;  Laterality: N/A;   NISSEN FUNDOPLICATION  29/52/8413   POLYPECTOMY  08/11/2017   Procedure: POLYPECTOMY;  Surgeon: Rogene Houston, MD;  [facility];   Service: Endoscopy;;  colon      Current Outpatient Medications:    benzonatate (TESSALON PERLES) 100 MG capsule, Take 1 capsule (100 mg total) by mouth 3 (three) times daily as needed., Disp: 20 capsule, Rfl: 0   Ascorbic Acid (VITAMIN C PO), Take by mouth daily., Disp: , Rfl:    aspirin EC 81 MG tablet, Take 1 tablet (81 mg total) by mouth. Every other day, Disp: , Rfl:    cyanocobalamin (,VITAMIN B-12,) 1000 MCG/ML injection, Inject 1 mL (1,000 mcg total) into the muscle once., Disp: 1 mL, Rfl: 0   Cyanocobalamin (VITAMIN B 12 PO), Take by mouth daily., Disp: , Rfl:    DULoxetine (CYMBALTA) 60 MG capsule, Take 1 capsule (60 mg total) by mouth daily., Disp: 30 capsule, Rfl: 3   fexofenadine (ALLEGRA) 60 MG tablet, Take 1 tablet (60 mg total) by mouth 2 (two) times daily., Disp: 60 tablet, Rfl: 1   fluticasone (FLONASE) 50 MCG/ACT nasal spray, Place 2 sprays into both nostrils daily., Disp: 16 g, Rfl: 6   metroNIDAZOLE (METROGEL VAGINAL) 0.75 % vaginal gel, Nightly x 5 nights, Disp: 70 g, Rfl: 0   Multiple Vitamins-Minerals (MULTIVITAMIN ADULTS) TABS, Take by mouth daily., Disp: , Rfl:    mupirocin ointment (BACTROBAN) 2 %, Apply 1 application topically 2 (two) times daily., Disp: 15 g, Rfl: 1   pantoprazole (PROTONIX) 40 MG tablet, 1 tab po qd prn acid reflux, Disp: 30 tablet, Rfl: 3   promethazine (PHENERGAN) 25 MG tablet, 1/2-1 tab po q6h prn nausea/vomiting, Disp: 30 tablet, Rfl: 0  EXAM:  VITALS per patient if applicable:  GENERAL: alert, oriented, appears well and in no acute distress  HEENT: atraumatic, conjunttiva clear, no obvious abnormalities on inspection of external nose and ears  NECK: normal movements of the head and neck  LUNGS: on inspection no signs of respiratory distress, breathing rate appears normal, no obvious gross SOB, gasping or wheezing  CV: no obvious cyanosis  MS: moves all visible extremities without noticeable abnormality  PSYCH/NEURO:  pleasant and cooperative, no obvious depression or anxiety, speech and thought processing grossly intact  ASSESSMENT AND PLAN:  Discussed the following assessment and plan:  Nasal congestion  Cough  -we discussed possible serious and likely etiologies, options for evaluation and workup, limitations of telemedicine visit vs in person visit, treatment, treatment risks and precautions. Pt prefers to treat via telemedicine empirically rather than in person at this moment.  Discussed possibility of viral upper respiratory illness, COVID-19, mild influenza versus other.  She thinks influenza is less likely and she has not had a fever.  We discussed options for treatment, potential complications, COVID testing options and precautions.  Opted for nasal saline, Tylenol if needed, Tessalon prescription sent for cough and COVID testing.  Daughter plans to help her set up a COVID test, preferably today on the test sites.  Advised to stay home while sick except to seek medical care.  Advised prompt video visit follow-up if positive COVID test to discuss treatment, and to seek prompt follow-up with her primary care office or in person care if worsening, new symptoms  arise, or if is not improving with treatment. Discussed options for inperson care if PCP office not available. Did let this patient know that I only do telemedicine on Tuesdays and Thursdays for [place]. Advised to schedule follow up visit with PCP or [facility] if any further questions or concerns to avoid delays in care.   I discussed the assessment and treatment plan with the patient. The patient was provided an opportunity to ask questions and all were answered. The patient agreed with the plan and demonstrated an understanding of the instructions.     Lucretia Kern, DO

## 2020-11-14 NOTE — Patient Instructions (Signed)
  HOME CARE TIPS:  -[place] testing information: https://www.[facility].org/ OR [phone] Most pharmacies also offer testing and home test kits.  -I sent the medication(s) we discussed to your pharmacy: Meds ordered this encounter  Medications  . benzonatate (TESSALON PERLES) 100 MG capsule    Sig: Take 1 capsule (100 mg total) by mouth 3 (three) times daily as needed.    Dispense:  20 capsule    Refill:  0     -If you have a positive covid test please schedule prompt follow up if you are interested in a referral for treatment.   -can use tylenol if needed for fevers, aches and pains per instructions  -can use nasal saline a few times per day if you have nasal congestion  -stay hydrated, drink plenty of fluids and eat small healthy meals - avoid dairy  -If the Covid test is positive, check out the [facility] website for more information on home care, transmission and treatment for COVID19  -follow up with your doctor in 2-3 days unless improving and feeling better  -stay home while sick, except to seek medical care, and if you have [place] ideally it would be best to stay home for a full 10 days since the onset of symptoms PLUS one day of no fever and feeling better. Wear a good mask (such as N95 or KN95) if around others to reduce the risk of transmission.  It was nice to meet you today, and I really hope you are feeling better soon. I help [place] out with telemedicine visits on Tuesdays and Thursdays and am available for visits on those days. If you have any concerns or questions following this visit please schedule a follow up visit with your Primary Care doctor or seek care at a local urgent care clinic to avoid delays in care.    Seek in person care or schedule a follow up video visit promptly if your symptoms worsen, new concerns arise or you are not improving with treatment. Call 911 and/or seek emergency care if your symptoms are  severe or life threatening.

## 2020-11-17 DIAGNOSIS — U071 COVID-19: Secondary | ICD-10-CM

## 2020-11-17 DIAGNOSIS — Z20822 Contact with and (suspected) exposure to covid-19: Secondary | ICD-10-CM | POA: Diagnosis not present

## 2020-11-17 HISTORY — DX: COVID-19: U07.1

## 2020-11-19 ENCOUNTER — Encounter: Payer: Self-pay | Admitting: Family Medicine

## 2020-11-19 ENCOUNTER — Telehealth (INDEPENDENT_AMBULATORY_CARE_PROVIDER_SITE_OTHER): Payer: Medicare PPO | Admitting: Family Medicine

## 2020-11-19 VITALS — BP 146/80 | HR 90

## 2020-11-19 DIAGNOSIS — U071 COVID-19: Secondary | ICD-10-CM | POA: Diagnosis not present

## 2020-11-19 NOTE — Progress Notes (Signed)
Virtual Visit via Video Note  I connected with pt on 11/19/20 at  4:00 PM EST by a video enabled telemedicine application and verified that I am speaking with the correct person using two identifiers.  Location patient: home, [place] Location provider:work or home office Persons participating in the virtual visit: patient, provider  I discussed the limitations of evaluation and management by telemedicine and the availability of in person appointments. The patient expressed understanding and agreed to proceed.  Telemedicine visit is a necessity given the COVID-19 restrictions in place at the current time.  HPI: 80 y/o WF being seen today for "sinus drainage". Video visit with Dr. Maudie Mercury 11/14/20 reviewed: URI onset approx 11/11/20.  Tessalon rx'd, covid testing recommended and this was done 1/24 and was POSITIVE.  States she feels "good", esp the last 2 days. Still feeling like she has a little "sinus drainage".  Minimal pressure around eyes/face but no distinct pain and no HA or ST.  No body aches or fatigue. No cough, no fever, no SOB or wheezing or CP.  ROS: See pertinent positives and negatives per HPI.  Past Medical History:  Diagnosis Date  . Abnormal EKG 05/2016   LAFB, voltage criteria for LVH in lead aVL.  [place] Anxiety   . Cataract    right  . Chronic left shoulder pain    RC impingement + osteoarthritis. as of 09/2020 ortho eval, surg recommended but pt holding off, got steroid inj  . Colon cancer screening 06/2017   Cologuard positive--colonoscopy showed adenomatous polyp with high grade dysplasia--recall 3 yrs.  . Cystocele, unspecified (CODE) 2018   Pessary fitted at Dr. Brynda Greathouse:  Milex ring with support #3--great improvement.  . Degenerative arthritis of cervical spine   . Fatty liver   . Fibromyalgia   . GERD (gastroesophageal reflux disease)   . History of hiatal hernia   . Hx: UTI (urinary tract infection)    recurrent.  Pyelo hospitalization 2012  . Idiopathic urticaria  02/2018  . Microhematuria 02/2012   Cystoscopy with bladder biopsy and cystouretograms--pathology benign.  . Myalgia    Hx of diffuse myalgias, onset in her 71s  . Osteoarthritis of left shoulder    Recurrent L shoulder pain  . Osteoporosis 08/10/2016   T-score -2.5: started fosamax 07/2016.  Intol fosamax; started prolia 08/27/2016, most recent prolia was 08/30/2018. DEXA 11/2018 T score -2.3 (improved).  . Periesophageal hiatal hernia 02/2016   Type 4  . Peripheral neuropathy    bilat LLs/feetparesthesias: Dr. Posey Pronto to do NCS/EMGs as of 01/2019  . Thickened endometrium 10/16/2011   on CT.  F/u pelvic u/s showed nabothian cyst to account for this..  No enometrial pathology noted on u/s, no ovaries visualized.  . Vertigo   . Vitamin B12 deficiency 11/2015   + LL PN    Past Surgical History:  Procedure Laterality Date  . BACK SURGERY  1990s x 2   Dr. Leeanne Deed in [place]  . CHOLECYSTECTOMY    . COLONOSCOPY  approx 2002; 08/11/17   2002; Diverticulosis, int hemorrhoids ([facility]).  2018 (for + cologuard)--adenomatous polyps with high grade dysplasia (recall 3 yrs), also diverticulosis.  . COLONOSCOPY N/A 08/11/2017   Procedure: COLONOSCOPY;  Surgeon: Rogene Houston, MD;  [facility];  Service: Endoscopy;  Laterality: N/A;  7:30  . DEXA  12/06/2018   T score -2.3 (improved)->continue prolia q56mo  . ESOPHAGEAL MANOMETRY N/A 04/19/2016   Normal.  Procedure: ESOPHAGEAL MANOMETRY (EM);  Surgeon: Mauri Pole,  MD;  [facility];  Service: Endoscopy;  Laterality: N/A;  . EYE SURGERY Right    cataract extraction with IOL  . HYSTEROSCOPY  11/2004   Cervical hystogram   . INSERTION OF MESH N/A 06/18/2016   Procedure: INSERTION OF MESH;  Surgeon: Michael Boston, MD;  [facility];  Service: General;  Laterality: N/A;  . NISSEN FUNDOPLICATION  [id number]  . POLYPECTOMY  08/11/2017   Procedure: POLYPECTOMY;  Surgeon: Rogene Houston, MD;  [facility];   Service: Endoscopy;;  colon      Current Outpatient Medications:  .  Ascorbic Acid (VITAMIN C PO), Take by mouth daily., Disp: , Rfl:  .  aspirin EC 81 MG tablet, Take 1 tablet (81 mg total) by mouth. Every other day, Disp: , Rfl:  .  cyanocobalamin (,VITAMIN B-12,) 1000 MCG/ML injection, Inject 1 mL (1,000 mcg total) into the muscle once., Disp: 1 mL, Rfl: 0 .  DULoxetine (CYMBALTA) 60 MG capsule, Take 1 capsule (60 mg total) by mouth daily., Disp: 30 capsule, Rfl: 3 .  fluticasone (FLONASE) 50 MCG/ACT nasal spray, Place 2 sprays into both nostrils daily., Disp: 16 g, Rfl: 6 .  Multiple Vitamins-Minerals (MULTIVITAMIN ADULTS) TABS, Take by mouth daily., Disp: , Rfl:  .  benzonatate (TESSALON PERLES) 100 MG capsule, Take 1 capsule (100 mg total) by mouth 3 (three) times daily as needed. (Patient not taking: Reported on 11/19/2020), Disp: 20 capsule, Rfl: 0 .  Cyanocobalamin (VITAMIN B 12 PO), Take by mouth daily. (Patient not taking: Reported on 11/19/2020), Disp: , Rfl:  .  fexofenadine (ALLEGRA) 60 MG tablet, Take 1 tablet (60 mg total) by mouth 2 (two) times daily. (Patient not taking: Reported on 11/19/2020), Disp: 60 tablet, Rfl: 1 .  metroNIDAZOLE (METROGEL VAGINAL) 0.75 % vaginal gel, Nightly x 5 nights (Patient not taking: Reported on 11/19/2020), Disp: 70 g, Rfl: 0 .  mupirocin ointment (BACTROBAN) 2 %, Apply 1 application topically 2 (two) times daily. (Patient not taking: Reported on 11/19/2020), Disp: 15 g, Rfl: 1 .  pantoprazole (PROTONIX) 40 MG tablet, 1 tab po qd prn acid reflux (Patient not taking: Reported on 11/19/2020), Disp: 30 tablet, Rfl: 3 .  promethazine (PHENERGAN) 25 MG tablet, 1/2-1 tab po q6h prn nausea/vomiting (Patient not taking: Reported on 11/19/2020), Disp: 30 tablet, Rfl: 0  EXAM:  VITALS per patient if applicable:  Vitals with BMI 11/19/2020 09/03/2020 07/11/2020  Height - 5' 6.5" -  Weight - 152 lbs 152 lbs 10 oz  BMI - 123456 -  Systolic 123456 - 123456  Diastolic 80  - 89  Pulse 90 - 70     GENERAL: alert, oriented, appears well and in no acute distress  HEENT: atraumatic, conjunttiva clear, no obvious abnormalities on inspection of external nose and ears  NECK: normal movements of the head and neck  LUNGS: on inspection no signs of respiratory distress, breathing rate appears normal, no obvious gross SOB, gasping or wheezing  CV: no obvious cyanosis  MS: moves all visible extremities without noticeable abnormality  PSYCH/NEURO: pleasant and cooperative, no obvious depression or anxiety, speech and thought processing grossly intact  LABS: none today    Chemistry      Component Value Date/Time   NA 140 03/17/2020 1141   K 3.8 03/17/2020 1141   CL 102 03/17/2020 1141   CO2 30 03/17/2020 1141   BUN 13 03/17/2020 1141   CREATININE 0.64 03/17/2020 1141      Component Value Date/Time  CALCIUM 10.0 03/17/2020 1141   ALKPHOS 73 03/17/2020 1141   AST 18 03/17/2020 1141   ALT 9 03/17/2020 1141   BILITOT 0.7 03/17/2020 1141      ASSESSMENT AND PLAN:  Discussed the following assessment and plan:  URI w/out any sign of LRTI or systemic illness. Covid POS on PCR testing 11/18/19. Her symptoms have essentially resolved at this point. Encouraged pt to quarantine until approx 14d from onset of illness.  I discussed the assessment and treatment plan with the patient. The patient was provided an opportunity to ask questions and all were answered. The patient agreed with the plan and demonstrated an understanding of the instructions.   F/u: prn  Signed:  Crissie Sickles, MD           11/19/2020

## 2020-11-24 ENCOUNTER — Ambulatory Visit: Payer: Medicare PPO

## 2020-11-26 DIAGNOSIS — Z20822 Contact with and (suspected) exposure to covid-19: Secondary | ICD-10-CM | POA: Diagnosis not present

## 2021-04-08 ENCOUNTER — Ambulatory Visit: Payer: Medicare PPO | Admitting: Family Medicine

## 2021-04-08 ENCOUNTER — Encounter: Payer: Self-pay | Admitting: Family Medicine

## 2021-04-08 ENCOUNTER — Other Ambulatory Visit: Payer: Self-pay

## 2021-04-08 VITALS — BP 138/81 | HR 64 | Temp 97.8°F | Resp 16 | Ht 66.5 in | Wt 150.2 lb

## 2021-04-08 DIAGNOSIS — M797 Fibromyalgia: Secondary | ICD-10-CM | POA: Diagnosis not present

## 2021-04-08 DIAGNOSIS — Z1231 Encounter for screening mammogram for malignant neoplasm of breast: Secondary | ICD-10-CM

## 2021-04-08 DIAGNOSIS — F411 Generalized anxiety disorder: Secondary | ICD-10-CM | POA: Diagnosis not present

## 2021-04-08 DIAGNOSIS — M810 Age-related osteoporosis without current pathological fracture: Secondary | ICD-10-CM | POA: Diagnosis not present

## 2021-04-08 DIAGNOSIS — E538 Deficiency of other specified B group vitamins: Secondary | ICD-10-CM

## 2021-04-08 DIAGNOSIS — G5793 Unspecified mononeuropathy of bilateral lower limbs: Secondary | ICD-10-CM

## 2021-04-08 MED ORDER — GABAPENTIN 300 MG PO CAPS: ORAL_CAPSULE | ORAL | 3 refills | Status: DC

## 2021-04-08 MED ORDER — DENOSUMAB 60 MG/ML ~~LOC~~ SOSY
60.0000 mg | PREFILLED_SYRINGE | Freq: Once | SUBCUTANEOUS | Status: AC
  Administered 2021-04-08: 60 mg via SUBCUTANEOUS

## 2021-04-08 MED ORDER — CYANOCOBALAMIN 1000 MCG/ML IJ SOLN
1000.0000 ug | Freq: Once | INTRAMUSCULAR | Status: AC
  Administered 2021-04-08: 1000 ug via INTRAMUSCULAR

## 2021-04-08 NOTE — Patient Instructions (Signed)
Call neurologist's office (Dr. Posey Pronto) for appointment if the gabapentin is not helpful. [phone]

## 2021-04-08 NOTE — Progress Notes (Signed)
OFFICE VISIT  04/08/2021  CC:  Chief Complaint  Patient presents with   Leg Pain    Both are numb but left is worse; 1 year but has worsened since last visit. Numbness right at calf muscle    HPI:    Patient is a 80 y.o. Caucasian female with osteoporosis, fibromyalgia, GAD, and lower extremities PN who presents accompanied by her husband Eddie Dibbles for f/u of these problems.  Neuropathy: was on gabapentin back in 2020 per neurologist.  Burning pain L>R legs from just above knees down into feet.  Both feel numb.   The pain is constant, same wt bearing as not wt bearing.  Tries to move them around all the time to make legs more comfortable. No color changes or swelling.  Doing ok regarding anxiety and mood, takes cymbalta 60mg  qd. Her myofascial pain does not seem to be a big concern of late.  She is due for her prolia injection (last was 09/2020).    ROS as above, plus--> no fevers, no CP, no SOB, no wheezing, no cough, no dizziness, no HAs, no rashes, no melena/hematochezia.  No polyuria or polydipsia.  No myalgias or arthralgias.  No focal weakness, no tremors.  No acute vision or hearing abnormalities.  No dysuria or unusual/new urinary urgency or frequency.   No n/v/d or abd pain.  No palpitations.    Past Medical History:  Diagnosis Date   Abnormal EKG 05/2016   LAFB, voltage criteria for LVH in lead aVL.   Anxiety    Cataract    right   Chronic left shoulder pain    RC impingement + osteoarthritis. as of 09/2020 ortho eval, surg recommended but pt holding off, got steroid inj   Colon cancer screening 06/2017   Cologuard positive--colonoscopy showed adenomatous polyp with high grade dysplasia--recall 3 yrs.   COVID-19 virus infection 11/17/2020   Mild URI   Cystocele, unspecified (CODE) 2018   Pessary fitted at Dr. Brynda Greathouse:  Milex ring with support #3--great improvement.   Degenerative arthritis of cervical spine    Fatty liver    Fibromyalgia    GERD (gastroesophageal  reflux disease)    History of hiatal hernia    Hx: UTI (urinary tract infection)    recurrent.  Pyelo hospitalization 2012   Idiopathic urticaria 02/2018   Microhematuria 02/2012   Cystoscopy with bladder biopsy and cystouretograms--pathology benign.   Myalgia    Hx of diffuse myalgias, onset in her 16s   Osteoarthritis of left shoulder    Recurrent L shoulder pain   Osteoporosis 08/10/2016   T-score -2.5: started fosamax 07/2016.  Intol fosamax; started prolia 08/27/2016, most recent prolia was 08/30/2018. DEXA 11/2018 T score -2.3 (improved).   Periesophageal hiatal hernia 02/2016   Type 4   Peripheral neuropathy    bilat LLs/feetparesthesias: Dr. Posey Pronto to do NCS/EMGs as of 01/2019   Thickened endometrium 10/16/2011   on CT.  F/u pelvic u/s showed nabothian cyst to account for this..  No enometrial pathology noted on u/s, no ovaries visualized.   Vertigo    Vitamin B12 deficiency 11/2015   + LL PN    Past Surgical History:  Procedure Laterality Date   BACK SURGERY  1990s x 2   Dr. Leeanne Deed in [place]  approx 2002; 08/11/17   2002; Diverticulosis, int hemorrhoids ([facility]).  2018 (for + cologuard)--adenomatous polyps with high grade dysplasia (recall 3 yrs), also diverticulosis.   COLONOSCOPY N/A  08/11/2017   Procedure: COLONOSCOPY;  Surgeon: Rogene Houston, MD;  Location: [facility];  Service: Endoscopy;  Laterality: N/A;  7:30   DEXA  12/06/2018   T score -2.3 (improved)->continue prolia q48mo   ESOPHAGEAL MANOMETRY N/A 04/19/2016   Normal.  Procedure: ESOPHAGEAL MANOMETRY (EM);  Surgeon: Mauri Pole, MD;  Location: [facility];  Service: Endoscopy;  Laterality: N/A;   EYE SURGERY Right    cataract extraction with IOL   HYSTEROSCOPY  11/2004   Cervical hystogram    INSERTION OF MESH N/A 06/18/2016   Procedure: INSERTION OF MESH;  Surgeon: Michael Boston, MD;  Location: [facility];  Service: General;  Laterality: N/A;   NISSEN  FUNDOPLICATION  41/28/7867   POLYPECTOMY  08/11/2017   Procedure: POLYPECTOMY;  Surgeon: Rogene Houston, MD;  Location: [facility];  Service: Endoscopy;;  colon     Outpatient Medications Prior to Visit  Medication Sig Dispense Refill   Ascorbic Acid (VITAMIN C PO) Take by mouth daily.     aspirin EC 81 MG tablet Take 1 tablet (81 mg total) by mouth. Every other day     cyanocobalamin (,VITAMIN B-12,) 1000 MCG/ML injection Inject 1 mL (1,000 mcg total) into the muscle once. 1 mL 0   DULoxetine (CYMBALTA) 60 MG capsule Take 1 capsule (60 mg total) by mouth daily. 30 capsule 3   fexofenadine (ALLEGRA) 60 MG tablet Take 1 tablet (60 mg total) by mouth 2 (two) times daily. 60 tablet 1   Multiple Vitamins-Minerals (MULTIVITAMIN ADULTS) TABS Take by mouth daily.     Cyanocobalamin (VITAMIN B 12 PO) Take by mouth daily. (Patient not taking: No sig reported)     fluticasone (FLONASE) 50 MCG/ACT nasal spray Place 2 sprays into both nostrils daily. (Patient not taking: Reported on 04/08/2021) 16 g 6   promethazine (PHENERGAN) 25 MG tablet 1/2-1 tab po q6h prn nausea/vomiting (Patient not taking: Reported on 04/08/2021) 30 tablet 0   benzonatate (TESSALON PERLES) 100 MG capsule Take 1 capsule (100 mg total) by mouth 3 (three) times daily as needed. (Patient not taking: No sig reported) 20 capsule 0   metroNIDAZOLE (METROGEL VAGINAL) 0.75 % vaginal gel Nightly x 5 nights (Patient not taking: No sig reported) 70 g 0   mupirocin ointment (BACTROBAN) 2 % Apply 1 application topically 2 (two) times daily. (Patient not taking: No sig reported) 15 g 1   pantoprazole (PROTONIX) 40 MG tablet 1 tab po qd prn acid reflux (Patient not taking: No sig reported) 30 tablet 3   No facility-administered medications prior to visit.    Allergies  Allergen Reactions   Ciprofloxacin Nausea And Vomiting   Codeine Nausea And Vomiting and Other (See Comments)    Feels funny   Celebrex [Celecoxib] Rash   Penicillins  Rash    Has patient had a PCN reaction causing immediate rash, facial/tongue/throat swelling, SOB or lightheadedness with hypotension: Rash Has patient had a PCN reaction causing severe rash involving mucus membranes or skin necrosis: No Has patient had a PCN reaction that required hospitalization No Has patient had a PCN reaction occurring within the last 10 years: NO If all of the above answers are "NO", then may proceed with Cephalosporin use.    ROS As per HPI  PE: Vitals with BMI 04/08/2021 11/19/2020 09/03/2020  Height 5' 6.5" - 5' 6.5"  Weight 150 lbs 3 oz - 152 lbs  BMI 67.20 - 94.70  Systolic 962 836 -  Diastolic 81 80 -  Pulse 64 90 -   Gen: Alert, well appearing.  Patient is oriented to person, place, time, and situation. AFFECT: pleasant, lucid thought and speech. Walks quite slow and takes steps as if feet hurt a lot. LEGS: no swelling, erythema, tenderness, or warmth.  ROM intact. Color is pink.  Some scattered small, non-inflamed varicosities are noted. No edema.  Touch sensation is diminished from knees down into toes bilat.   LABS:  Lab Results  Component Value Date   TSH 1.46 03/17/2020   Lab Results  Component Value Date   WBC 6.1 03/17/2020   HGB 13.6 03/17/2020   HCT 39.7 03/17/2020   MCV 95.3 03/17/2020   PLT 236.0 03/17/2020   Lab Results  Component Value Date   VITAMINB12 377 03/17/2020   Lab Results  Component Value Date   IRON 123 03/17/2020   TIBC 341 03/17/2020   FERRITIN 25 03/17/2020    Lab Results  Component Value Date   CREATININE 0.64 03/17/2020   BUN 13 03/17/2020   NA 140 03/17/2020   K 3.8 03/17/2020   CL 102 03/17/2020   CO2 30 03/17/2020   Lab Results  Component Value Date   ALT 9 03/17/2020   AST 18 03/17/2020   ALKPHOS 73 03/17/2020   BILITOT 0.7 03/17/2020   Lab Results  Component Value Date   CHOL 183 11/09/2018   Lab Results  Component Value Date   HDL 58.70 11/09/2018   Lab Results  Component Value  Date   LDLCALC 100 (H) 11/09/2018   Lab Results  Component Value Date   TRIG 125.0 11/09/2018   Lab Results  Component Value Date   CHOLHDL 3 11/09/2018   Lab Results  Component Value Date   HGBA1C 5.7 12/19/2015   IMPRESSION AND PLAN:  1) Peripheral neuropathy: unknown etiology. This pain is leading to debilitation.  Gabapentin had been helping but somewhere along the line she got off this med, she doesn't recall why.  Has not followed up with neurology as per our plan back in 2020. I gave her Dr. Serita Grit contact # and she'll call for appt if the restart of gabapentin today is unhelpful. Start gabapentin 300 qhs and gradually titrate up to 300 tid.    2) Osteoporosis; has been on prolia a couple years--rpt DEXA ordered today. Cont Ca and Vit D supp and gave prolia 60mg  IM today.   BMET and vit D level today. Next prolia 6 mo.  3) Fibromyalgia and GAD: doing well on cymbalta 60mg  qd.  4) Preventative health care: Hx adenomatous polyp with high grade dysplasia-->overdue for rpt colonoscopy as of 06/2020--she'll call her GI to inquire. Overdue for annual screening mammography-ordered  An After Visit Summary was printed and given to the patient.  FOLLOW UP: Return in about 6 months (around 10/08/2021) for routine chronic illness f/u.  Signed:  Crissie Sickles, MD           04/08/2021

## 2021-04-08 NOTE — Addendum Note (Signed)
Addended by: Deveron Furlong D on: 04/08/2021 03:56 PM   Modules accepted: Orders

## 2021-04-09 LAB — BASIC METABOLIC PANEL
BUN: 8 mg/dL (ref 6–23)
CO2: 29 mEq/L (ref 19–32)
Calcium: 9.7 mg/dL (ref 8.4–10.5)
Chloride: 104 mEq/L (ref 96–112)
Creatinine, Ser: 0.67 mg/dL (ref 0.40–1.20)
GFR: 83.25 mL/min (ref >60.00)
Glucose, Bld: 89 mg/dL (ref 70–99)
Potassium: 3.9 mEq/L (ref 3.5–5.1)
Sodium: 143 mEq/L (ref 135–145)

## 2021-04-09 LAB — VITAMIN D 25 HYDROXY (VIT D DEFICIENCY, FRACTURES): VITD: 24.61 ng/mL — ABNORMAL LOW (ref 30.00–100.00)

## 2021-04-10 ENCOUNTER — Encounter: Payer: Self-pay | Admitting: Family Medicine

## 2021-05-06 ENCOUNTER — Telehealth: Payer: Self-pay

## 2021-05-06 DIAGNOSIS — M79662 Pain in left lower leg: Secondary | ICD-10-CM

## 2021-05-06 DIAGNOSIS — M79661 Pain in right lower leg: Secondary | ICD-10-CM

## 2021-05-06 NOTE — Telephone Encounter (Signed)
OK, referral ordered 

## 2021-05-06 NOTE — Telephone Encounter (Signed)
Pt was last seen 04/08/21 and advised to contact Dr.Patel if gabapentin rx does not help. They would like referral for vascular instead.   Please advise, thanks.

## 2021-05-06 NOTE — Telephone Encounter (Signed)
Patient daughter Karen Delacruz [facility]) calling about patient leg.  It is not getting any better with meds Dr. Anitra Lauth prescribed a few weeks ago. Patient would like to be referred to:  Dr. Curt Jews - Vascular Surgeon with [facility] at [place], [place] location.  Please contact Karen Delacruz [phone]

## 2021-05-07 NOTE — Telephone Encounter (Signed)
Pt's daughter, Tonya(DPR) advised referral placed, should receive call to schedule within 14 business days

## 2021-05-11 ENCOUNTER — Telehealth: Payer: Self-pay | Admitting: Family Medicine

## 2021-05-11 NOTE — Telephone Encounter (Signed)
Patient's grandson (not on [place]) called stating patient is requesting referral to [facility], Dr. Satira Anis. Gramig for her leg problems. Please call Lavella Lemons with any questions.

## 2021-05-12 NOTE — Telephone Encounter (Signed)
Please advise 

## 2021-05-13 NOTE — Telephone Encounter (Signed)
Dr. Amedeo Plenty is a hand surgeon specialist so referral to him is not an option for her problem. I highly suggest she call [facility] to arrange a follow up visit with Dr. Posey Pronto, the MD that saw her for her legs symptoms back in April 2020.  Pt did not get the testing that Dr. Posey Pronto recommended at that time. Pls give her Dr. Serita Grit number.-thx

## 2021-05-13 NOTE — Telephone Encounter (Signed)
Karen Delacruz to call Dr. Posey Pronto to schedule OV with that office

## 2021-05-14 ENCOUNTER — Other Ambulatory Visit: Payer: Self-pay

## 2021-05-14 ENCOUNTER — Ambulatory Visit (HOSPITAL_COMMUNITY)
Admission: RE | Admit: 2021-05-14 | Discharge: 2021-05-14 | Disposition: A | Discharge status: Home / Self Care | Payer: Medicare PPO | Source: Ambulatory Visit | Attending: Family Medicine | Admitting: Family Medicine

## 2021-05-14 ENCOUNTER — Encounter: Payer: Self-pay | Admitting: Family Medicine

## 2021-05-14 DIAGNOSIS — Z1231 Encounter for screening mammogram for malignant neoplasm of breast: Secondary | ICD-10-CM | POA: Diagnosis not present

## 2021-05-14 DIAGNOSIS — Z780 Asymptomatic menopausal state: Secondary | ICD-10-CM | POA: Diagnosis not present

## 2021-05-14 DIAGNOSIS — M810 Age-related osteoporosis without current pathological fracture: Secondary | ICD-10-CM

## 2021-05-14 DIAGNOSIS — M85851 Other specified disorders of bone density and structure, right thigh: Secondary | ICD-10-CM | POA: Diagnosis not present

## 2021-05-14 DIAGNOSIS — M85831 Other specified disorders of bone density and structure, right forearm: Secondary | ICD-10-CM | POA: Diagnosis not present

## 2021-05-22 ENCOUNTER — Other Ambulatory Visit: Payer: Self-pay

## 2021-05-22 ENCOUNTER — Encounter: Payer: Self-pay | Admitting: Neurology

## 2021-05-22 ENCOUNTER — Other Ambulatory Visit (INDEPENDENT_AMBULATORY_CARE_PROVIDER_SITE_OTHER): Payer: Medicare PPO

## 2021-05-22 ENCOUNTER — Ambulatory Visit: Payer: Medicare PPO | Admitting: Neurology

## 2021-05-22 VITALS — BP 154/88 | HR 74 | Ht 67.0 in | Wt 146.0 lb

## 2021-05-22 DIAGNOSIS — R202 Paresthesia of skin: Secondary | ICD-10-CM

## 2021-05-22 LAB — B12 AND FOLATE PANEL
Folate: >24.4 ng/mL (ref >5.9)
Vitamin B-12: 311 pg/mL (ref 211–911)

## 2021-05-22 LAB — SEDIMENTATION RATE: Sed Rate: 15 mm/hr (ref 0–30)

## 2021-05-22 NOTE — Patient Instructions (Signed)
Nerve testing of the legs.  Do not apply any moisturizer on the day of testing to your legs and feet.  Check labs  ELECTROMYOGRAM AND NERVE CONDUCTION STUDIES (EMG/NCS) INSTRUCTIONS  How to Prepare The neurologist conducting the EMG will need to know if you have certain medical conditions. Tell the neurologist and other EMG lab personnel if you: Have a pacemaker or any other electrical medical device Take blood-thinning medications Have hemophilia, a blood-clotting disorder that causes prolonged bleeding Bathing Take a shower or bath shortly before your exam in order to remove oils from your skin. Don't apply lotions or creams before the exam.  What to Expect You'll likely be asked to change into a hospital gown for the procedure and lie down on an examination table. The following explanations can help you understand what will happen during the exam.  Electrodes. The neurologist or a technician places surface electrodes at various locations on your skin depending on where you're experiencing symptoms. Or the neurologist may insert needle electrodes at different sites depending on your symptoms.  Sensations. The electrodes will at times transmit a tiny electrical current that you may feel as a twinge or spasm. The needle electrode may cause discomfort or pain that usually ends shortly after the needle is removed. If you are concerned about discomfort or pain, you may want to talk to the neurologist about taking a short break during the exam.  Instructions. During the needle EMG, the neurologist will assess whether there is any spontaneous electrical activity when the muscle is at rest - activity that isn't present in healthy muscle tissue - and the degree of activity when you slightly contract the muscle.  He or she will give you instructions on resting and contracting a muscle at appropriate times. Depending on what muscles and nerves the neurologist is examining, he or she may ask you to change  positions during the exam.  After your EMG You may experience some temporary, minor bruising where the needle electrode was inserted into your muscle. This bruising should fade within several days. If it persists, contact your primary care doctor.

## 2021-05-22 NOTE — Progress Notes (Signed)
Follow-up Visit   Date: 05/22/21   Karen Delacruz MRN: [id number] DOB: 1940/12/22   Interim History: Karen Delacruz is a 80 y.o. left-handed Caucasian female with depression, fibromyalgia s/p lumbar surgery x 2,and GERD returning to the clinic for follow-up of neuropathy.  The patient was accompanied to the clinic by daughter Karen Delacruz) who also provides collateral information.    History of present illness: Starting around 2015, she began experiencing tingling and numbness in the foot which has progressed to the level of the knees.  During this time, she was also noted to have vitamin B12 deficiency and began injections, but did not appreciate any change in her numbness/tingling.  She has a sensation that her legs are asleep and sometimes upon standing, she is cautious before walking because she cannot always tell where her foot is.  Symptoms are always worse at night time and often cause sleep disturbance.   She does not have numbness/tingling in the hands, low back pain, or leg weakness.  Balance is good and she walks unassisted.  No recent falls.   She live with husband and has two children.  Retired from working in various roles in [employer].    No history of diabetes, thyroid problems, or family history of neuropathy.   UPDATE 05/22/2021:  She was last seen in April 2020 during the pandemic as a virtual visit.  At that visit, she reported tingling and numbness in the legs.  EMG was recommended but not performed.  She was also started on gabapentin '100mg'$  BID, but did not appreciate any benefit.  Several months ago, she began having worsening numbness and pain in the legs.  She saw her PCP who started gabapentin '300mg'$  TID She reports having no change in her symptoms.  She also complains tight sensation over the left knee.  She has mild low back pain, no radicular leg pain. Balance is fair. She walks unassisted.   Medications:  Current Outpatient Medications on File Prior to Visit   Medication Sig Dispense Refill   Ascorbic Acid (VITAMIN C PO) Take by mouth daily.     aspirin EC 81 MG tablet Take 1 tablet (81 mg total) by mouth. Every other day     cyanocobalamin (,VITAMIN B-12,) 1000 MCG/ML injection Inject 1 mL (1,000 mcg total) into the muscle once. 1 mL 0   fexofenadine (ALLEGRA) 60 MG tablet Take 1 tablet (60 mg total) by mouth 2 (two) times daily. 60 tablet 1   gabapentin (NEURONTIN) 300 MG capsule 1 cap po tid for pain in legs 90 capsule 3   Multiple Vitamins-Minerals (MULTIVITAMIN ADULTS) TABS Take by mouth daily.     No current facility-administered medications on file prior to visit.    Allergies:  Allergies  Allergen Reactions   Ciprofloxacin Nausea And Vomiting   Codeine Nausea And Vomiting and Other (See Comments)    Feels funny   Celebrex [Celecoxib] Rash   Penicillins Rash    Has patient had a PCN reaction causing immediate rash, facial/tongue/throat swelling, SOB or lightheadedness with hypotension: Rash Has patient had a PCN reaction causing severe rash involving mucus membranes or skin necrosis: No Has patient had a PCN reaction that required hospitalization No Has patient had a PCN reaction occurring within the last 10 years: NO If all of the above answers are "NO", then may proceed with Cephalosporin use.    Vital Signs:  BP (!) 154/88   Pulse 74   Ht '5\' 7"'$  (1.702 m)  Wt 146 lb (66.2 kg)   SpO2 98%   BMI 22.87 kg/m   Neurological Exam: MENTAL STATUS including orientation to time, place, person, recent and remote memory, attention span and concentration, language, and fund of knowledge is normal.  Speech is not dysarthric.  CRANIAL NERVES:  No visual field defects.  Pupils equal round and reactive to light.  Normal conjugate, extra-ocular eye movements in all directions of gaze.  No ptosis.  Face is symmetric. Palate elevates symmetrically.  Tongue is midline.  MOTOR:  Motor strength is 5/5 in all extremities.  No atrophy,  fasciculations or abnormal movements.  No pronator drift.  Tone is normal.    MSRs:  Reflexes are 2+/4 throughout, except absent at the ankles.  SENSORY:  Vibration is reduced at the knees and absent distal to ankles.  Pin prick and temperature reduced from the mid-calf down into the feet.    COORDINATION/GAIT:  Normal finger-to- nose-finger.  Intact rapid alternating movements bilaterally.  Gait shows poor armswing bilaterally, small steps, stooped posture, unassisted and stable.   Data: Lab Results  Component Value Date   HGBA1C 5.7 12/19/2015   Lab Results  Component Value Date   TSH 1.46 03/17/2020   Lab Results  Component Value Date   [id number] 03/17/2020    IMPRESSION/PLAN: Bilateral lower extremity parethesias, most consistent with peripheral neuropathy.  No history of diabetes, alcohol, or chemotherapy exposure.  If EDX confirms neuropathy, I suspect this is most likely degenerative.  Alternatively, she has history of lumbar surgery and if her testing indicates this could be stemming from her back, imaging would be indicated.  - NCS/EMG of the legs to characterize the nature of her symptoms  - Check folate, vitamin B12, SPEP with IFE, copper    - Continue gabapentin '300mg'$  three times daily.  Given symptoms are predominately numbness, titrating this will not be helpful  - PT for balance declined  Further recommendations pending results.   Thank you for allowing me to participate in patient's care.  If I can answer any additional questions, I would be pleased to do so.    Sincerely,    Javante Nilsson K. Posey Pronto, DO

## 2021-05-26 LAB — PROTEIN ELECTROPHORESIS, SERUM
Albumin ELP: 4.2 g/dL (ref 3.8–4.8)
Alpha 1: 0.3 g/dL (ref 0.2–0.3)
Alpha 2: 0.8 g/dL (ref 0.5–0.9)
Beta 2: 0.3 g/dL (ref 0.2–0.5)
Beta Globulin: 0.5 g/dL (ref 0.4–0.6)
Gamma Globulin: 1.3 g/dL (ref 0.8–1.7)
Total Protein: 7.4 g/dL (ref 6.1–8.1)

## 2021-05-26 LAB — COPPER, SERUM: Copper: 102 ug/dL (ref 70–175)

## 2021-05-27 LAB — IMMUNOFIXATION ELECTROPHORESIS
IgG (Immunoglobin G), Serum: 1310 mg/dL (ref 600–1540)
IgM, Serum: 156 mg/dL (ref 50–300)
Immunofix Electr Int: NOT DETECTED
Immunoglobulin A: 184 mg/dL (ref 70–320)

## 2021-06-24 ENCOUNTER — Encounter: Payer: Medicare PPO | Admitting: Vascular Surgery

## 2021-06-24 ENCOUNTER — Encounter: Payer: Medicare PPO | Admitting: Neurology

## 2021-07-08 ENCOUNTER — Encounter: Payer: Medicare PPO | Admitting: Neurology

## 2021-07-16 ENCOUNTER — Ambulatory Visit: Payer: Medicare PPO | Admitting: Neurology

## 2021-07-16 ENCOUNTER — Other Ambulatory Visit: Payer: Self-pay

## 2021-07-16 DIAGNOSIS — G629 Polyneuropathy, unspecified: Secondary | ICD-10-CM | POA: Diagnosis not present

## 2021-07-16 DIAGNOSIS — R202 Paresthesia of skin: Secondary | ICD-10-CM

## 2021-07-16 HISTORY — PX: OTHER SURGICAL HISTORY: SHX169

## 2021-07-16 NOTE — Addendum Note (Signed)
Addended by: Armen Pickup A on: 07/16/2021 02:46 PM   Modules accepted: Orders

## 2021-07-16 NOTE — Progress Notes (Signed)
Follow-up Visit   Date: 07/16/21   VICI NOVICK MRN: [id number] DOB: 06-Nov-1940   Interim History: Karen Delacruz is a 80 y.o. left-handed Caucasian female with depression, fibromyalgia s/p lumbar surgery x 2,and GERD returning to the clinic for follow-up of neuropathy.  The patient was accompanied to the clinic by daughter Lavella Lemons) who also provides collateral information.    History of present illness: Starting around 2015, she began experiencing tingling and numbness in the foot which has progressed to the level of the knees.  During this time, she was also noted to have vitamin B12 deficiency and began injections, but did not appreciate any change in her numbness/tingling.  She has a sensation that her legs are asleep and sometimes upon standing, she is cautious before walking because she cannot always tell where her foot is.  Symptoms are always worse at night time and often cause sleep disturbance.   She does not have numbness/tingling in the hands, low back pain, or leg weakness.  Balance is good and she walks unassisted.  No recent falls.   She live with husband and has two children.  Retired from working in various roles in [employer].    No history of diabetes, thyroid problems, or family history of neuropathy.   UPDATE 05/22/2021:  She was last seen in April 2020 during the pandemic as a virtual visit.  At that visit, she reported tingling and numbness in the legs.  EMG was recommended but not performed.  She was also started on gabapentin 100mg  BID, but did not appreciate any benefit.  Several months ago, she began having worsening numbness and pain in the legs.  She saw her PCP who started gabapentin 300mg  TID She reports having no change in her symptoms.  She also complains tight sensation over the left knee.  She has mild low back pain, no radicular leg pain. Balance is fair. She walks unassisted.   UPDATE 07/16/2021:  She is here for electrodiagnostic study testing of the  legs.  See below for results.  She continues to have ongoing numbness and tingling in the lower extremities and feet.  Balance remains poor, she tends to hold onto the her daughter for support when walking.  She has not suffered any falls.  Medications:  Current Outpatient Medications on File Prior to Visit  Medication Sig Dispense Refill   Ascorbic Acid (VITAMIN C PO) Take by mouth daily.     aspirin EC 81 MG tablet Take 1 tablet (81 mg total) by mouth. Every other day     cyanocobalamin (,VITAMIN B-12,) 1000 MCG/ML injection Inject 1 mL (1,000 mcg total) into the muscle once. 1 mL 0   fexofenadine (ALLEGRA) 60 MG tablet Take 1 tablet (60 mg total) by mouth 2 (two) times daily. 60 tablet 1   gabapentin (NEURONTIN) 300 MG capsule 1 cap po tid for pain in legs 90 capsule 3   Multiple Vitamins-Minerals (MULTIVITAMIN ADULTS) TABS Take by mouth daily.     No current facility-administered medications on file prior to visit.    Allergies:  Allergies  Allergen Reactions   Ciprofloxacin Nausea And Vomiting   Codeine Nausea And Vomiting and Other (See Comments)    Feels funny   Celebrex [Celecoxib] Rash   Penicillins Rash    Has patient had a PCN reaction causing immediate rash, facial/tongue/throat swelling, SOB or lightheadedness with hypotension: Rash Has patient had a PCN reaction causing severe rash involving mucus membranes or skin necrosis: No  Has patient had a PCN reaction that required hospitalization No Has patient had a PCN reaction occurring within the last 10 years: NO If all of the above answers are "NO", then may proceed with Cephalosporin use.    Vital Signs:  There were no vitals taken for this visit.  Neurological Exam: Deferred, see prior note  Data: Lab Results  Component Value Date   HGBA1C 5.7 12/19/2015   Lab Results  Component Value Date   TSH 1.46 03/17/2020   Lab Results  Component Value Date   VITAMINB12 311 05/22/2021     IMPRESSION/PLAN: Peripheral neuropathy, idiopathic.  Results of electrodiagnostic testing was discussed with patient and her daughter which is consistent with neuropathy.  Unfortunately, there is no treatment for neuropathy and management is supportive.  Her neuropathy labs have all been normal.  - For her gait imbalance, I will refer her for outpatient physical therapy for gait training.  - Continue gabapentin 300 mg 3 times daily.  Patient was reminded that this is for painful symptoms only, as numbness does not respond to medication.   Thank you for allowing me to participate in patient's care.  If I can answer any additional questions, I would be pleased to do so.    Sincerely,    Kamoni Gentles K. Posey Pronto, DO

## 2021-07-16 NOTE — Procedures (Signed)
[facility]  [place], [place]  [place], [place] [address] Tel: [phone] Fax:  [phone] Test Date:  07/16/2021  Patient: Karen Delacruz DOB: 10/05/1941 Physician: Narda Amber, DO  Sex: Female Height: 5\' 7"  Ref Phys: Narda Amber, DO  ID#: [id number]   Technician:    Patient Complaints: This is a 80 year old female referred for evaluation of bilateral leg and feet paresthesias.  NCV & EMG Findings: Extensive electrodiagnostic testing of the right lower extremity and additional studies of the left shows:  Bilateral sural and superficial peroneal sensory responses are within normal limits. Bilateral peroneal motor responses at the extensor digitorum brevis are absent, and normal at the tibialis anterior.  Bilateral tibial motor responses show reduced amplitudes. Bilateral tibial H reflex studies show prolonged latencies. Chronic motor axonal loss changes are seen affecting the muscles below the knee bilaterally, without accompanied active denervation.  Impression: The electrophysiologic findings are most consistent with a chronic sensorimotor axonal polyneuropathy affecting the lower extremities.   ___________________________ Narda Amber, DO    Nerve Conduction Studies Anti Sensory Summary Table   Stim Site NR Peak (ms) Norm Peak (ms) P-T Amp (V) Norm P-T Amp  Left Sup Peroneal Anti Sensory (Ant Lat Mall)  33C  12 cm NR  <4.6  >3  Right Sup Peroneal Anti Sensory (Ant Lat Mall)  33C  12 cm NR  <4.6  >3  Left Sural Anti Sensory (Lat Mall)  33C  Calf NR  <4.6  >3  Right Sural Anti Sensory (Lat Mall)  33C  Calf NR  <4.6  >3   Motor Summary Table   Stim Site NR Onset (ms) Norm Onset (ms) O-P Amp (mV) Norm O-P Amp Site1 Site2 Delta-0 (ms) Dist (cm) Vel (m/s) Norm Vel (m/s)  Left Peroneal Motor (Ext Dig Brev)  33C  Ankle NR  <6.0  >2.5 B Fib Ankle  0.0  >40  B Fib NR     Poplt B Fib  0.0  >40  Poplt NR            Right Peroneal Motor (Ext Dig  Brev)  33C  Ankle NR  <6.0  >2.5 B Fib Ankle  0.0  >40  B Fib NR     Poplt B Fib  0.0  >40  Poplt NR            Left Peroneal TA Motor (Tib Ant)  33C  Fib Head    3.0 <4.5 3.6 >3 Poplit Fib Head 1.5 8.0 53 >40  Poplit    4.5  3.6         Right Peroneal TA Motor (Tib Ant)  33C  Fib Head    2.1 <4.5 3.4 >3 Poplit Fib Head 1.8 8.0 44 >40  Poplit    3.9  3.2         Left Tibial Motor (Abd Hall Brev)  33C  Ankle    5.2 <6.0 1.0 >4 Knee Ankle 9.9 43.0 43 >40  Knee    15.1  0.4         Right Tibial Motor (Abd Hall Brev)  33C  Ankle    4.1 <6.0 1.4 >4 Knee Ankle 11.1 45.0 41 >40  Knee    15.2  0.7          H Reflex Studies   NR H-Lat (ms) Lat Norm (ms) L-R H-Lat (ms)  Left Tibial (Gastroc)  33C     35.92 <35 1.63  Right Tibial (  Gastroc)  33C     37.55 <35 1.63   EMG   Side Muscle Ins Act Fibs Psw Fasc Number Recrt Dur Dur. Amp Amp. Poly Poly. Comment  Right GluteusMed Nml Nml Nml Nml Nml Nml Nml Nml Nml Nml Nml Nml N/A  Right BicepsFemS Nml Nml Nml Nml Nml Nml Nml Nml Nml Nml Nml Nml N/A  Right RectFemoris Nml Nml Nml Nml Nml Nml Nml Nml Nml Nml Nml Nml N/A  Right AntTibialis Nml Nml Nml Nml 1- Rapid Some 1+ Some 1+ Some 1+ N/A  Right Flex Dig Long Nml Nml Nml Nml 1- Rapid Some 1+ Some 1+ Some 1+ N/A  Right Gastroc Nml Nml Nml Nml 1- Rapid Some 1+ Some 1+ Some 1+ N/A  Left BicepsFemS Nml Nml Nml Nml Nml Nml Nml Nml Nml Nml Nml Nml N/A  Left RectFemoris Nml Nml Nml Nml Nml Nml Nml Nml Nml Nml Nml Nml N/A  Left GluteusMed Nml Nml Nml Nml Nml Nml Nml Nml Nml Nml Nml Nml N/A  Left AntTibialis Nml Nml Nml Nml 1- Rapid Some 1+ Some 1+ Some 1+ N/A  Left Gastroc Nml Nml Nml Nml 1- Rapid Some 1+ Some 1+ Some 1+ N/A      Waveforms:

## 2021-07-21 ENCOUNTER — Telehealth: Payer: Self-pay

## 2021-07-21 NOTE — Telephone Encounter (Signed)
Pt and husband are coming on Friday for flu shots and b12. They are not due for f/u until dec. Last b12 check was 04/21/20. Please advise if needing labs for b12 before getting shot.

## 2021-07-21 NOTE — Telephone Encounter (Signed)
B12 level not needed for her or husband. Thx.

## 2021-07-21 NOTE — Telephone Encounter (Signed)
LM for pt to returncall

## 2021-07-22 NOTE — Telephone Encounter (Signed)
noted 

## 2021-07-24 ENCOUNTER — Other Ambulatory Visit: Payer: Self-pay

## 2021-07-24 ENCOUNTER — Encounter: Payer: Self-pay | Admitting: Family Medicine

## 2021-07-24 ENCOUNTER — Ambulatory Visit (INDEPENDENT_AMBULATORY_CARE_PROVIDER_SITE_OTHER): Payer: Medicare PPO

## 2021-07-24 DIAGNOSIS — Z23 Encounter for immunization: Secondary | ICD-10-CM

## 2021-07-24 DIAGNOSIS — E538 Deficiency of other specified B group vitamins: Secondary | ICD-10-CM

## 2021-07-24 MED ORDER — CYANOCOBALAMIN 1000 MCG/ML IJ SOLN
1000.0000 ug | Freq: Once | INTRAMUSCULAR | Status: AC
  Administered 2021-07-24: 1000 ug via INTRAMUSCULAR

## 2021-08-17 ENCOUNTER — Encounter: Payer: Self-pay | Admitting: Family Medicine

## 2021-08-17 ENCOUNTER — Other Ambulatory Visit: Payer: Self-pay

## 2021-08-17 ENCOUNTER — Ambulatory Visit: Payer: Medicare PPO | Admitting: Family Medicine

## 2021-08-17 VITALS — BP 155/69 | HR 61 | Temp 97.6°F | Ht 67.0 in | Wt 142.0 lb

## 2021-08-17 DIAGNOSIS — M25512 Pain in left shoulder: Secondary | ICD-10-CM | POA: Diagnosis not present

## 2021-08-17 DIAGNOSIS — M19012 Primary osteoarthritis, left shoulder: Secondary | ICD-10-CM

## 2021-08-17 DIAGNOSIS — M7582 Other shoulder lesions, left shoulder: Secondary | ICD-10-CM

## 2021-08-17 DIAGNOSIS — G8929 Other chronic pain: Secondary | ICD-10-CM | POA: Diagnosis not present

## 2021-08-17 MED ORDER — TRIAMCINOLONE ACETONIDE 40 MG/ML IJ SUSP
40.0000 mg | Freq: Once | INTRAMUSCULAR | Status: AC
  Administered 2021-08-17: 40 mg via INTRAMUSCULAR

## 2021-08-17 NOTE — Progress Notes (Addendum)
OFFICE VISIT  08/17/2021  CC:  Chief Complaint  Patient presents with   Shoulder Pain    Left; would like another injection. Pain worsened 2 weeks ago, has used tylenol and gabapentin for relief.   HPI:    Patient is a 80 y.o. female who presents for arm concern.  INTERIM HX: About a 2-week history of significant left shoulder pain.  She had been painting and doing some overhead movement more prior to onset.  Prior to 2 weeks ago her shoulder did not bother her but she does have a history of rotator cuff impingement syndrome as well as left glenohumeral osteoarthritis.  Past Medical History:  Diagnosis Date   Abnormal EKG 05/2016   LAFB, voltage criteria for LVH in lead aVL.   Anxiety    Cataract    right   Chronic left shoulder pain    RC impingement + osteoarthritis. as of 09/2020 ortho eval, surg recommended but pt holding off, got steroid inj   Colon cancer screening 06/2017   Cologuard positive--colonoscopy showed adenomatous polyp with high grade dysplasia--recall 3 yrs.   COVID-19 virus infection 11/17/2020   Mild URI   Cystocele, unspecified (CODE) 2018   Pessary fitted at Dr. Brynda Greathouse:  Milex ring with support #3--great improvement.   Degenerative arthritis of cervical spine    Fatty liver    Fibromyalgia    GERD (gastroesophageal reflux disease)    History of hiatal hernia    Hx: UTI (urinary tract infection)    recurrent.  Pyelo hospitalization 2012   Idiopathic urticaria 02/2018   Microhematuria 02/2012   Cystoscopy with bladder biopsy and cystouretograms--pathology benign.   Myalgia    Hx of diffuse myalgias, onset in her 74s   Osteoarthritis of left shoulder    Recurrent L shoulder pain   Osteoporosis 08/10/2016   T-score -2.5: started fosamax 07/2016.  Intol fosamax; started prolia 08/27/2016, most recent prolia was 08/30/2018. DEXA 11/2018 T score -2.3 (improved). 04/2021 DEXA T score -2.4.   Periesophageal hiatal hernia 02/2016   Type 4   Peripheral  neuropathy    bilat LLs/feetparesthesias: Dr. Posey Pronto to do NCS/EMGs as of 01/2019   Thickened endometrium 10/16/2011   on CT.  F/u pelvic u/s showed nabothian cyst to account for this..  No enometrial pathology noted on u/s, no ovaries visualized.   Vertigo    Vitamin B12 deficiency 11/2015   + LL PN   Vitamin D deficiency     Past Surgical History:  Procedure Laterality Date   BACK SURGERY  1990s x 2   Dr. Leeanne Deed in [place]  approx 2002; 08/11/17   2002; Diverticulosis, int hemorrhoids ([facility]).  2018 (for + cologuard)--adenomatous polyps with high grade dysplasia (recall 3 yrs), also diverticulosis.   COLONOSCOPY N/A 08/11/2017   Procedure: COLONOSCOPY;  Surgeon: Rogene Houston, MD;  [facility];  Service: Endoscopy;  Laterality: N/A;  7:30   DEXA  12/06/2018   T score -2.3 (improved)->continue prolia q35mo.  04/2021 T score -2.4.   ESOPHAGEAL MANOMETRY N/A 04/19/2016   Normal.  Procedure: ESOPHAGEAL MANOMETRY (EM);  Surgeon: Mauri Pole, MD;  Location: [facility];  Service: Endoscopy;  Laterality: N/A;   EYE SURGERY Right    cataract extraction with IOL   HYSTEROSCOPY  11/2004   Cervical hystogram    INSERTION OF MESH N/A 06/18/2016   Procedure: INSERTION OF MESH;  Surgeon: Michael Boston, MD;  Location: [facility];  Service: General;  Laterality: N/A;   NISSEN FUNDOPLICATION  62/94/7654   POLYPECTOMY  08/11/2017   Procedure: POLYPECTOMY;  Surgeon: Rogene Houston, MD;  [facility]: [facility];  Service: Endoscopy;;  colon     Outpatient Medications Prior to Visit  Medication Sig Dispense Refill   Ascorbic Acid (VITAMIN C PO) Take by mouth daily.     aspirin EC 81 MG tablet Take 1 tablet (81 mg total) by mouth. Every other day     cyanocobalamin (,VITAMIN B-12,) 1000 MCG/ML injection Inject 1 mL (1,000 mcg total) into the muscle once. 1 mL 0   fexofenadine (ALLEGRA) 60 MG tablet Take 1 tablet (60 mg total) by mouth 2 (two)  times daily. 60 tablet 1   gabapentin (NEURONTIN) 300 MG capsule 1 cap po tid for pain in legs 90 capsule 3   Multiple Vitamins-Minerals (MULTIVITAMIN ADULTS) TABS Take by mouth daily.     No facility-administered medications prior to visit.    Allergies  Allergen Reactions   Ciprofloxacin Nausea And Vomiting   Codeine Nausea And Vomiting and Other (See Comments)    Feels funny   Celebrex [Celecoxib] Rash   Penicillins Rash    Has patient had a PCN reaction causing immediate rash, facial/tongue/throat swelling, SOB or lightheadedness with hypotension: Rash Has patient had a PCN reaction causing severe rash involving mucus membranes or skin necrosis: No Has patient had a PCN reaction that required hospitalization No Has patient had a PCN reaction occurring within the last 10 years: NO If all of the above answers are "NO", then may proceed with Cephalosporin use.    ROS As per HPI  PE: Vitals with BMI 08/17/2021 05/22/2021 04/08/2021  Height 5\' 7"  5\' 7"  5' 6.5"  Weight 142 lbs 146 lbs 150 lbs 3 oz  BMI 22.24 65.03 54.65  Systolic 681 275 170  Diastolic 69 88 81  Pulse 61 74 64     General, alert and oriented and pleasant. Left shoulder with tenderness to palpation over the [facility] joint as well as subacromial. No tenderness over the biceps tendon.  Impingement signs elicited significant pain and she had ability to actively and passively AB duct left shoulder internal and external rotation were moderately limited and painful as well.  Equivocal drop sign.  LABS:    Chemistry      Component Value Date/Time   NA 143 04/08/2021 1529   K 3.9 04/08/2021 1529   CL 104 04/08/2021 1529   CO2 29 04/08/2021 1529   BUN 8 04/08/2021 1529   CREATININE 0.67 04/08/2021 1529      Component Value Date/Time   CALCIUM 9.7 04/08/2021 1529   ALKPHOS 73 03/17/2020 1141   AST 18 03/17/2020 1141   ALT 9 03/17/2020 1141   BILITOT 0.7 03/17/2020 1141      IMPRESSION AND PLAN:  #1: Left  shoulder pain, multifactorial (RC tendonopathy/tear--chronic, AC jt arthritis, and GH jt arthritis).  Ultrasound L shoulder today at bedside did show signif fluid in biceps tendon sheath. Also showed full thickness supraspinatus tear.  No signif fluid visible in subacr/subdelt bursa.  AC joint arthritic changes as well.  Also could not visualize her posterior glenoid labrum. Patient does desire a steroid injection today, and we decided to try a subacromial injection--this would be technically intra-articular since her RC is torn. Of note she has seen orthopedics in the past for this and she was told that she needs a shoulder replacement but should defer this as long  as injections are helpful.  There is always the option of getting her back to ortho but she prefers to wait for now.  Procedure: Therapeutic subacromial/subdeltod injection.  The patient's clinical condition is marked by substantial pain and/or significant functional disability.  Other conservative therapy has not provided relief, is contraindicated, or not appropriate.  There is a reasonable likelihood that injection will significantly improve the patient's pain and/or functional disability. Cleaned skin with alcohol swab, used ultrasound guidance for lateral approach. Injected 37ml of 40mg /ml kenalog +2  ml of 1% plain lidocaine into subacromial/subdeltoid bursa. No immediate complications.  Patient tolerated procedure well.  Post-injection care discussed, including 20 min of icing 1-2 times in the next 4-8 hours, frequent non weight-bearing ROM exercises over the next few days, and general pain medication management.  An After Visit Summary was printed and given to the patient.  FOLLOW UP: Return in about 2 weeks (around 08/31/2021) for f/u left shoulder pain.  Signed:  Crissie Sickles, MD           08/17/2021

## 2021-08-31 ENCOUNTER — Ambulatory Visit: Payer: Medicare PPO | Admitting: Family Medicine

## 2021-08-31 ENCOUNTER — Other Ambulatory Visit: Payer: Self-pay

## 2021-08-31 ENCOUNTER — Encounter: Payer: Self-pay | Admitting: Family Medicine

## 2021-08-31 VITALS — BP 149/84 | HR 70 | Temp 98.3°F | Wt 139.4 lb

## 2021-08-31 DIAGNOSIS — G8929 Other chronic pain: Secondary | ICD-10-CM

## 2021-08-31 DIAGNOSIS — R030 Elevated blood-pressure reading, without diagnosis of hypertension: Secondary | ICD-10-CM | POA: Diagnosis not present

## 2021-08-31 DIAGNOSIS — M12812 Other specific arthropathies, not elsewhere classified, left shoulder: Secondary | ICD-10-CM

## 2021-08-31 DIAGNOSIS — E538 Deficiency of other specified B group vitamins: Secondary | ICD-10-CM

## 2021-08-31 DIAGNOSIS — M75102 Unspecified rotator cuff tear or rupture of left shoulder, not specified as traumatic: Secondary | ICD-10-CM | POA: Diagnosis not present

## 2021-08-31 DIAGNOSIS — M25512 Pain in left shoulder: Secondary | ICD-10-CM | POA: Diagnosis not present

## 2021-08-31 DIAGNOSIS — M19012 Primary osteoarthritis, left shoulder: Secondary | ICD-10-CM | POA: Diagnosis not present

## 2021-08-31 MED ORDER — CYANOCOBALAMIN 1000 MCG/ML IJ SOLN
1000.0000 ug | Freq: Once | INTRAMUSCULAR | Status: AC
  Administered 2021-08-31: 1000 ug via INTRAMUSCULAR

## 2021-08-31 NOTE — Patient Instructions (Signed)
Check your blood pressure and heart rate once a day for the next week and send mychart message with these numbers.

## 2021-08-31 NOTE — Progress Notes (Signed)
See student note for this encounter. I personally was present during the history, physical exam, and medical decision-making activities of this service and have verified that the service and findings are accurately documented in the student's note. Signed:  Crissie Sickles, MD           08/31/2021

## 2021-08-31 NOTE — Progress Notes (Addendum)
OFFICE VISIT   08/31/2021   CC: L shoulder pain     HPI:     Patient is a 80 y.o. female who presents for 2 week f/u left shoulder pain (RC tendonopathy/tear--chronic, AC jt arthritis, and GH jt arthritis). She reports the pain as sharp and lasting all day. Nothing alleviates the pain and movement makes it worse. Her pain today is a 6/10, but she is able to function throughout the day. Since her steroid injection at the last visit, she feels her pain has improved somewhat; however, she is not experiencing the pain relief she used to from the steroid injection. ROS is positive for tenderness to palpation and muscle pain with all movements of L shoulder.   We reviewed her bp up some today, also review of past visits shows pretty commonly in mild elev range.  Reports remote hx of being on a "fluid pill" for elev bp but she can't recall specifics.  No CP, dizziness, HAs, or sob.      Past Medical History:  Diagnosis Date   Abnormal EKG 05/2016    LAFB, voltage criteria for LVH in lead aVL.   Anxiety     Cataract      right   Chronic left shoulder pain      RC impingement + osteoarthritis. as of 09/2020 ortho eval, surg recommended but pt holding off, got steroid inj   Colon cancer screening 06/2017    Cologuard positive--colonoscopy showed adenomatous polyp with high grade dysplasia--recall 3 yrs.   COVID-19 virus infection 11/17/2020    Mild URI   Cystocele, unspecified (CODE) 2018    Pessary fitted at Dr. Brynda Greathouse:  Milex ring with support #3--great improvement.   Degenerative arthritis of cervical spine     Fatty liver     Fibromyalgia     GERD (gastroesophageal reflux disease)     History of hiatal hernia     Hx: UTI (urinary tract infection)      recurrent.  Pyelo hospitalization 2012   Idiopathic urticaria 02/2018   Microhematuria 02/2012    Cystoscopy with bladder biopsy and cystouretograms--pathology benign.   Myalgia      Hx of diffuse myalgias, onset in her 35s    Osteoarthritis of left shoulder      Recurrent L shoulder pain   Osteoporosis 08/10/2016    T-score -2.5: started fosamax 07/2016.  Intol fosamax; started prolia 08/27/2016, most recent prolia was 08/30/2018. DEXA 11/2018 T score -2.3 (improved). 04/2021 DEXA T score -2.4.   Periesophageal hiatal hernia 02/2016    Type 4   Peripheral neuropathy      bilat LLs/feetparesthesias: Dr. Posey Pronto to do NCS/EMGs as of 01/2019   Thickened endometrium 10/16/2011    on CT.  F/u pelvic u/s showed nabothian cyst to account for this..  No enometrial pathology noted on u/s, no ovaries visualized.   Vertigo     Vitamin B12 deficiency 11/2015    + LL PN   Vitamin D deficiency             Past Surgical History:  Procedure Laterality Date   BACK SURGERY   1990s x 2    Dr. Leeanne Deed in [place]   approx 2002; 08/11/17    2002; Diverticulosis, int hemorrhoids ([facility]).  2018 (for + cologuard)--adenomatous polyps with high grade dysplasia (recall 3 yrs), also diverticulosis.   COLONOSCOPY N/A 08/11/2017    Procedure: COLONOSCOPY;  Surgeon: Laural Golden,  Mechele [place]ley, MD;  [facility];  Service: Endoscopy;  Laterality: N/A;  7:30   DEXA   12/06/2018    T score -2.3 (improved)->continue prolia q46mo.  04/2021 T score -2.4.   ESOPHAGEAL MANOMETRY N/A 04/19/2016    Normal.  Procedure: ESOPHAGEAL MANOMETRY (EM);  Surgeon: Mauri Pole, MD;  [facility]: [facility];  Service: Endoscopy;  Laterality: N/A;   EYE SURGERY Right      cataract extraction with IOL   HYSTEROSCOPY   11/2004    Cervical hystogram    INSERTION OF MESH N/A 06/18/2016    Procedure: INSERTION OF MESH;  Surgeon: Michael Boston, MD;  Location: [facility];  Service: General;  Laterality: N/A;   NISSEN FUNDOPLICATION   16/07/9603   POLYPECTOMY   08/11/2017    Procedure: POLYPECTOMY;  Surgeon: Rogene Houston, MD;  [facility];  Service: Endoscopy;;  colon              Outpatient Medications Prior  to Visit  Medication Sig Dispense Refill   Ascorbic Acid (VITAMIN C PO) Take by mouth daily.       aspirin EC 81 MG tablet Take 1 tablet (81 mg total) by mouth. Every other day       cyanocobalamin (,VITAMIN B-12,) 1000 MCG/ML injection Inject 1 mL (1,000 mcg total) into the muscle once. 1 mL 0   fexofenadine (ALLEGRA) 60 MG tablet Take 1 tablet (60 mg total) by mouth 2 (two) times daily. 60 tablet 1   gabapentin (NEURONTIN) 300 MG capsule 1 cap po tid for pain in legs 90 capsule 3   Multiple Vitamins-Minerals (MULTIVITAMIN ADULTS) TABS Take by mouth daily.        No facility-administered medications prior to visit.           Allergies  Allergen Reactions   Ciprofloxacin Nausea And Vomiting   Codeine Nausea And Vomiting and Other (See Comments)      Feels funny   Celebrex [Celecoxib] Rash   Penicillins Rash      Has patient had a PCN reaction causing immediate rash, facial/tongue/throat swelling, SOB or lightheadedness with hypotension: Rash Has patient had a PCN reaction causing severe rash involving mucus membranes or skin necrosis: No Has patient had a PCN reaction that required hospitalization No Has patient had a PCN reaction occurring within the last 10 years: NO If all of the above answers are "NO", then may proceed with Cephalosporin use.      ROS As per HPI   PE: Vitals with BMI 08/17/2021 05/22/2021 04/08/2021  Height 5\' 7"  5\' 7"  5' 6.5"  Weight 142 lbs 146 lbs 150 lbs 3 oz  BMI 22.24 54.09 81.19  Systolic 147 829 562  Diastolic 69 88 81  Pulse 61 74 64   Manual bp recheck at end of visit 158/90   Physical Exam Constitutional:      Appearance: Normal appearance.  Cardiovascular:     Rate and Rhythm: Normal rate and regular rhythm.     Heart sounds: Normal heart sounds.  Pulmonary:     Effort: Pulmonary effort is normal.     Breath sounds: Normal breath sounds.  Musculoskeletal:     Comments: Tenderness is present along the [facility] joint and upon arm movement     Neurological:     Mental Status: She is alert.  Psychiatric:        Mood and Affect: Mood normal.  L shoulder TTP along entire shoulder girdle and  over traps and L SCM mm's. ABduction limited to 80-90 deg, forward flexion limited to 45 deg, shoulder ext limited to 45 deg.  ER and IR limited and painful.    LABS:    Chemistry    Labs (Brief)          Component Value Date/Time    NA 143 04/08/2021 1529    K 3.9 04/08/2021 1529    CL 104 04/08/2021 1529    CO2 29 04/08/2021 1529    BUN 8 04/08/2021 1529    CREATININE 0.67 04/08/2021 1529      Labs (Brief)          Component Value Date/Time    CALCIUM 9.7 04/08/2021 1529    ALKPHOS 73 03/17/2020 1141    AST 18 03/17/2020 1141    ALT 9 03/17/2020 1141    BILITOT 0.7 03/17/2020 1141         Recent Labs       Lab Results  Component Value Date    WBC 6.1 03/17/2020    HGB 13.6 03/17/2020    HCT 39.7 03/17/2020    MCV 95.3 03/17/2020    PLT 236.0 03/17/2020      Recent Labs       Lab Results  Component Value Date    VITAMINB12 311 05/22/2021      IMPRESSION AND PLAN:   1) Chronic L shoulder pain d/t osteoarthritis and chronic RC tear. After administration of subacromial steroid injection 2 wks ago she has had mild improvement in her pain.However, her pain relief is not at the level of her past steroid injections. Discussed use of ibup 400 bid prn pain.  Also discussed seeing her ortho surgeon again to discuss this but pt states she can deal with the pain and she does what she needs to do around the house, etc. She wants to hold off on seeing ortho surgeon at this time. Consider [place] joint injection in the future.   2) Elevated blood pressure w/out dx HTN. Question past hx of being on antihypertensive. At this point I just want her to check bp and hr daily for the next week and send me mychart message with these numbers. No meds started today.   An After Visit Summary was printed and given to the patient.    FOLLOW UP: No follow-ups on file.  Phil Dopp - MS3   Signed:  Crissie Sickles, MD           08/31/2021

## 2021-09-07 ENCOUNTER — Encounter: Payer: Self-pay | Admitting: Family Medicine

## 2021-09-07 ENCOUNTER — Telehealth: Payer: Self-pay | Admitting: Family Medicine

## 2021-09-07 MED ORDER — CHLORTHALIDONE 15 MG PO TABS: 15.0000 mg | ORAL_TABLET | Freq: Every day | ORAL | 0 refills | Status: DC

## 2021-09-07 NOTE — Addendum Note (Signed)
Addended by: Deveron Furlong D on: 09/07/2021 11:57 AM   Modules accepted: Orders

## 2021-09-07 NOTE — Telephone Encounter (Signed)
Avg bp mid 130s over mid 80s.  I recommend she start a low dose of a bp med. Pls eRx chlorthalidone 15 mg, 1 tab po qd, #30, no RF. Office f/u to review home bp's and heart rates in 2wks.

## 2021-09-07 NOTE — Telephone Encounter (Signed)
Pt returned call.  Call back # [phone]

## 2021-09-07 NOTE — Telephone Encounter (Signed)
Spoke with pt regarding recommendations,voiced understanding. Rx sent to pharmacy on file. F/u appt scheduled

## 2021-09-07 NOTE — Telephone Encounter (Signed)
Pt called to report Blood pressure readings for the past week. She takes in the morning, not on BP meds.  11/8 BP 132/82 P 64 11/9 BP 137/85 P 66 11/10 BP 167/80 P 80 11/11 BP 139/82 P 69 11/12 BP 132/87 P 73 11/13 BP 116/70 P 66 11/14 BP 120/78 P 73

## 2021-09-07 NOTE — Telephone Encounter (Signed)
Please review and advise.

## 2021-09-07 NOTE — Telephone Encounter (Signed)
LM for pt to return call regarding recommendations.  

## 2021-09-08 ENCOUNTER — Telehealth: Payer: Self-pay

## 2021-09-08 NOTE — Telephone Encounter (Signed)
Pt called stating the medication is not covered by insurance and is affordable at this time. She would like to have something else sent in. Advised pt to contact insurance to find out what they will cover. Pt just wanted a note sent to PCP to get something else sent in.

## 2021-09-09 ENCOUNTER — Ambulatory Visit: Payer: Medicare PPO

## 2021-09-09 MED ORDER — AMLODIPINE BESYLATE 5 MG PO TABS: 5.0000 mg | ORAL_TABLET | Freq: Every day | ORAL | 1 refills | Status: DC

## 2021-09-09 NOTE — Addendum Note (Signed)
Addended by: Deveron Furlong D on: 09/09/2021 11:50 AM   Modules accepted: Orders

## 2021-09-09 NOTE — Telephone Encounter (Signed)
Pt advised of med change recommendations. Rx sent in.

## 2021-09-09 NOTE — Telephone Encounter (Signed)
Ok, pls eRx amlodipine 5 mg, 1 tab po qd, #30, RF x 1.

## 2021-09-21 ENCOUNTER — Ambulatory Visit: Payer: Medicare PPO | Admitting: Family Medicine

## 2021-10-14 ENCOUNTER — Other Ambulatory Visit: Payer: Self-pay

## 2021-10-14 ENCOUNTER — Ambulatory Visit (INDEPENDENT_AMBULATORY_CARE_PROVIDER_SITE_OTHER): Payer: Medicare PPO

## 2021-10-14 DIAGNOSIS — E538 Deficiency of other specified B group vitamins: Secondary | ICD-10-CM | POA: Diagnosis not present

## 2021-10-14 DIAGNOSIS — M810 Age-related osteoporosis without current pathological fracture: Secondary | ICD-10-CM | POA: Diagnosis not present

## 2021-10-14 MED ORDER — CYANOCOBALAMIN 1000 MCG/ML IJ SOLN
1000.0000 ug | Freq: Once | INTRAMUSCULAR | Status: AC
  Administered 2021-10-14: 11:00:00 1000 ug via INTRAMUSCULAR

## 2021-10-14 MED ORDER — DENOSUMAB 60 MG/ML ~~LOC~~ SOSY
60.0000 mg | PREFILLED_SYRINGE | Freq: Once | SUBCUTANEOUS | Status: AC
  Administered 2021-10-14: 11:00:00 60 mg via SUBCUTANEOUS

## 2021-10-14 NOTE — Progress Notes (Signed)
Karen Delacruz is a 80 y.o. female presents to the office today for Vitamin B12 per physician's orders.   Vitamin B12 1087mcg IM was administered right deltoid today. Patient tolerated injection.   Pt also with osteoporosis and treatment with prolia injection appropriate to give in office today  Please sign off in PCP absence

## 2021-11-05 ENCOUNTER — Other Ambulatory Visit: Payer: Self-pay | Admitting: Family Medicine

## 2021-11-12 ENCOUNTER — Other Ambulatory Visit: Payer: Self-pay

## 2021-11-12 MED ORDER — AMLODIPINE BESYLATE 5 MG PO TABS: 5.0000 mg | ORAL_TABLET | Freq: Every day | ORAL | 0 refills | Status: DC

## 2021-12-16 ENCOUNTER — Ambulatory Visit (INDEPENDENT_AMBULATORY_CARE_PROVIDER_SITE_OTHER): Payer: Medicare PPO

## 2021-12-16 ENCOUNTER — Other Ambulatory Visit: Payer: Self-pay

## 2021-12-16 DIAGNOSIS — Z0000 Encounter for general adult medical examination without abnormal findings: Secondary | ICD-10-CM

## 2021-12-16 NOTE — Patient Instructions (Signed)
[name] , Thank you for taking time to come for your Medicare Wellness Visit. I appreciate your ongoing commitment to your health goals. Please review the following plan we discussed and let me know if I can assist you in the future.   Screening recommendations/referrals: Colonoscopy: Done 08/11/17 pt stated will confer with PCP Mammogram: Done 05/14/21 Bone Density: Done 05/14/21 Recommended yearly ophthalmology/optometry visit for glaucoma screening and checkup Recommended yearly dental visit for hygiene and checkup  Vaccinations: Influenza vaccine: Done 07/24/21 repeat every year Pneumococcal vaccine: Up to date Tdap vaccine: Done 07/31/12 repeat every 10 years Due 07/31/22 Shingles vaccine: Completed 3/14, 03/28/18   Covid-19:Completed 4/8, 02/22/20  Advanced directives: Advance directive discussed with you today. Even though you declined this today please call our office should you change your mind and we can give you the proper paperwork for you to fill out.   Conditions/risks identified: none at this time  Next appointment: Follow up in one year for your annual wellness visit    Preventive Care 65 Years and Older, Female Preventive care refers to lifestyle choices and visits with your health care provider that can promote health and wellness. What does preventive care include? A yearly physical exam. This is also called an annual well check. Dental exams once or twice a year. Routine eye exams. Ask your health care provider how often you should have your eyes checked. Personal lifestyle choices, including: Daily care of your teeth and gums. Regular physical activity. Eating a healthy diet. Avoiding tobacco and drug use. Limiting alcohol use. Practicing safe sex. Taking low-dose aspirin every day. Taking vitamin and mineral supplements as recommended by your health care provider. What happens during an annual well check? The services and screenings done by your health care  provider during your annual well check will depend on your age, overall health, lifestyle risk factors, and family history of disease. Counseling  Your health care provider may ask you questions about your: Alcohol use. Tobacco use. Drug use. Emotional well-being. Home and relationship well-being. Sexual activity. Eating habits. History of falls. Memory and ability to understand (cognition). Work and work Statistician. Reproductive health. Screening  You may have the following tests or measurements: Height, weight, and BMI. Blood pressure. Lipid and cholesterol levels. These may be checked every 5 years, or more frequently if you are over 68 years old. Skin check. Lung cancer screening. You may have this screening every year starting at age 34 if you have a 30-pack-year history of smoking and currently smoke or have quit within the past 15 years. Fecal occult blood test (FOBT) of the stool. You may have this test every year starting at age 21. Flexible sigmoidoscopy or colonoscopy. You may have a sigmoidoscopy every 5 years or a colonoscopy every 10 years starting at age 63. Hepatitis C blood test. Hepatitis B blood test. Sexually transmitted disease (STD) testing. Diabetes screening. This is done by checking your blood sugar (glucose) after you have not eaten for a while (fasting). You may have this done every 1-3 years. Bone density scan. This is done to screen for osteoporosis. You may have this done starting at age 61. Mammogram. This may be done every 1-2 years. Talk to your health care provider about how often you should have regular mammograms. Talk with your health care provider about your test results, treatment options, and if necessary, the need for more tests. Vaccines  Your health care provider may recommend certain vaccines, such as: Influenza vaccine. This is recommended  every year. Tetanus, diphtheria, and acellular pertussis (Tdap, Td) vaccine. You may need a Td  booster every 10 years. Zoster vaccine. You may need this after age 43. Pneumococcal 13-valent conjugate (PCV13) vaccine. One dose is recommended after age 21. Pneumococcal polysaccharide (PPSV23) vaccine. One dose is recommended after age 10. Talk to your health care provider about which screenings and vaccines you need and how often you need them. This information is not intended to replace advice given to you by your health care provider. Make sure you discuss any questions you have with your health care provider. Document Released: 11/07/2015 Document Revised: 06/30/2016 Document Reviewed: 08/12/2015 Elsevier Interactive Patient Education  2017 [place] Prevention in the Home Falls can cause injuries. They can happen to people of all ages. There are many things you can do to make your home safe and to help prevent falls. What can I do on the outside of my home? Regularly fix the edges of walkways and driveways and fix any cracks. Remove anything that might make you trip as you walk through a door, such as a raised step or threshold. Trim any bushes or trees on the path to your home. Use bright outdoor lighting. Clear any walking paths of anything that might make someone trip, such as rocks or tools. Regularly check to see if handrails are loose or broken. Make sure that both sides of any steps have handrails. Any raised decks and porches should have guardrails on the edges. Have any leaves, snow, or ice cleared regularly. Use sand or salt on walking paths during winter. Clean up any spills in your garage right away. This includes oil or grease spills. What can I do in the bathroom? Use night lights. Install grab bars by the toilet and in the tub and shower. Do not use towel bars as grab bars. Use non-skid mats or decals in the tub or shower. If you need to sit down in the shower, use a plastic, non-slip stool. Keep the floor dry. Clean up any water that spills on the floor  as soon as it happens. Remove soap buildup in the tub or shower regularly. Attach bath mats securely with double-sided non-slip rug tape. Do not have throw rugs and other things on the floor that can make you trip. What can I do in the bedroom? Use night lights. Make sure that you have a light by your bed that is easy to reach. Do not use any sheets or blankets that are too big for your bed. They should not hang down onto the floor. Have a firm chair that has side arms. You can use this for support while you get dressed. Do not have throw rugs and other things on the floor that can make you trip. What can I do in the kitchen? Clean up any spills right away. Avoid walking on wet floors. Keep items that you use a lot in easy-to-reach places. If you need to reach something above you, use a strong step stool that has a grab bar. Keep electrical cords out of the way. Do not use floor polish or wax that makes floors slippery. If you must use wax, use non-skid floor wax. Do not have throw rugs and other things on the floor that can make you trip. What can I do with my stairs? Do not leave any items on the stairs. Make sure that there are handrails on both sides of the stairs and use them. Fix handrails that are broken  or loose. Make sure that handrails are as long as the stairways. Check any carpeting to make sure that it is firmly attached to the stairs. Fix any carpet that is loose or worn. Avoid having throw rugs at the top or bottom of the stairs. If you do have throw rugs, attach them to the floor with carpet tape. Make sure that you have a light switch at the top of the stairs and the bottom of the stairs. If you do not have them, ask someone to add them for you. What else can I do to help prevent falls? Wear shoes that: Do not have high heels. Have rubber bottoms. Are comfortable and fit you well. Are closed at the toe. Do not wear sandals. If you use a stepladder: Make sure that it is  fully opened. Do not climb a closed stepladder. Make sure that both sides of the stepladder are locked into place. Ask someone to hold it for you, if possible. Clearly mark and make sure that you can see: Any grab bars or handrails. First and last steps. Where the edge of each step is. Use tools that help you move around (mobility aids) if they are needed. These include: Canes. Walkers. Scooters. Crutches. Turn on the lights when you go into a dark area. Replace any light bulbs as soon as they burn out. Set up your furniture so you have a clear path. Avoid moving your furniture around. If any of your floors are uneven, fix them. If there are any pets around you, be aware of where they are. Review your medicines with your doctor. Some medicines can make you feel dizzy. This can increase your chance of falling. Ask your doctor what other things that you can do to help prevent falls. This information is not intended to replace advice given to you by your health care provider. Make sure you discuss any questions you have with your health care provider. Document Released: 08/07/2009 Document Revised: 03/18/2016 Document Reviewed: 11/15/2014 Elsevier Interactive Patient Education  2017 [employer].

## 2021-12-16 NOTE — Progress Notes (Signed)
Virtual Visit via Telephone Note  I connected with  Karen Delacruz on 12/16/21 at  8:00 AM EST by telephone and verified that I am speaking with the correct person using two identifiers.  Medicare Annual Wellness visit completed telephonically due to Covid-19 pandemic.   Persons participating in this call: This Health Coach and this patient.   Location: Patient: Home Provider: Office   I discussed the limitations, risks, security and privacy concerns of performing an evaluation and management service by telephone and the availability of in person appointments. The patient expressed understanding and agreed to proceed.  Unable to perform video visit due to video visit attempted and failed and/or patient does not have video capability.   Some vital signs may be absent or patient reported.   Willette Brace, LPN   Subjective:   Karen Delacruz is a 81 y.o. female who presents for Medicare Annual (Subsequent) preventive examination.  Review of Systems     Cardiac Risk Factors include: advanced age (>65men, >79 women);dyslipidemia     Objective:    There were no vitals filed for this visit. There is no height or weight on file to calculate BMI.  Advanced Directives 12/16/2021 05/22/2021 09/03/2020 08/11/2017 06/02/2017 06/18/2016 06/11/2016  Does Patient Have a Medical Advance Directive? No No No No No No No  Would patient like information on creating a medical advance directive? No - Patient declined - Yes (MAU/Ambulatory/Procedural Areas - Information given) No - Patient declined No - Patient declined No - patient declined information No - patient declined information  Pre-existing out of facility DNR order (yellow form or pink MOST form) - - - - - - -    Current Medications (verified) Outpatient Encounter Medications as of 12/16/2021  Medication Sig   amLODipine (NORVASC) 5 MG tablet Take 1 tablet (5 mg total) by mouth daily. OFFICE VISIT NEEDED FOR FURTHER REFILLS   Ascorbic Acid  (VITAMIN C PO) Take by mouth daily.   aspirin EC 81 MG tablet Take 1 tablet (81 mg total) by mouth. Every other day   chlorthalidone (HYGROTEN) 15 MG tablet Take 1 tablet (15 mg total) by mouth daily.   cyanocobalamin (,VITAMIN B-12,) 1000 MCG/ML injection Inject 1 mL (1,000 mcg total) into the muscle once.   fexofenadine (ALLEGRA) 60 MG tablet Take 1 tablet (60 mg total) by mouth 2 (two) times daily.   gabapentin (NEURONTIN) 300 MG capsule 1 cap po tid for pain in legs (Patient taking differently: 1 cap po tid for pain in legs, as needed)   Multiple Vitamins-Minerals (MULTIVITAMIN ADULTS) TABS Take by mouth daily.   No facility-administered encounter medications on file as of 12/16/2021.    Allergies (verified) Ciprofloxacin, Codeine, Celebrex [celecoxib], and Penicillins   History: Past Medical History:  Diagnosis Date   Abnormal EKG 05/2016   LAFB, voltage criteria for LVH in lead aVL.   Anxiety    Cataract    right   Chronic left shoulder pain    RC impingement + osteoarthritis. as of 09/2020 ortho eval, surg recommended but pt holding off, got steroid inj   Colon cancer screening 06/2017   Cologuard positive--colonoscopy showed adenomatous polyp with high grade dysplasia--recall 3 yrs.   COVID-19 virus infection 11/17/2020   Mild URI   Cystocele, unspecified (CODE) 2018   Pessary fitted at Dr. Brynda Greathouse:  Milex ring with support #3--great improvement.   Degenerative arthritis of cervical spine    Fatty liver    Fibromyalgia    GERD (  gastroesophageal reflux disease)    History of hiatal hernia    Hx: UTI (urinary tract infection)    recurrent.  Pyelo hospitalization 2012   Hypertension    Idiopathic urticaria 02/2018   Microhematuria 02/2012   Cystoscopy with bladder biopsy and cystouretograms--pathology benign.   Myalgia    Hx of diffuse myalgias, onset in her 62s   Osteoarthritis of left shoulder    Recurrent L shoulder pain   Osteoporosis 08/10/2016   T-score -2.5:  started fosamax 07/2016.  Intol fosamax; started prolia 08/27/2016, most recent prolia was 08/30/2018. DEXA 11/2018 T score -2.3 (improved). 04/2021 DEXA T score -2.4.   Periesophageal hiatal hernia 02/2016   Type 4   Peripheral neuropathy    NCS/EMGs 06/2021 chronic sensorimotor axonal polyneuropathy   Thickened endometrium 10/16/2011   on CT.  F/u pelvic u/s showed nabothian cyst to account for this..  No enometrial pathology noted on u/s, no ovaries visualized.   Vertigo    Vitamin B12 deficiency 11/2015   + LL PN   Vitamin D deficiency    Past Surgical History:  Procedure Laterality Date   BACK SURGERY  1990s x 2   Dr. Leeanne Deed in [place]  approx 2002; 08/11/17   2002; Diverticulosis, int hemorrhoids ([facility]).  2018 (for + cologuard)--adenomatous polyps with high grade dysplasia (recall 3 yrs), also diverticulosis.   COLONOSCOPY N/A 08/11/2017   Procedure: COLONOSCOPY;  Surgeon: Rogene Houston, MD;  [facility]: [facility];  Service: Endoscopy;  Laterality: N/A;  7:30   DEXA  12/06/2018   T score -2.3 (improved)->continue prolia q47mo.  04/2021 T score -2.4.   ESOPHAGEAL MANOMETRY N/A 04/19/2016   Normal.  Procedure: ESOPHAGEAL MANOMETRY (EM);  Surgeon: Mauri Pole, MD;  Location: [facility];  Service: Endoscopy;  Laterality: N/A;   EYE SURGERY Right    cataract extraction with IOL   HYSTEROSCOPY  11/2004   Cervical hystogram    INSERTION OF MESH N/A 06/18/2016   Procedure: INSERTION OF MESH;  Surgeon: Michael Boston, MD;  Location: [facility];  Service: General;  Laterality: N/A;   NCS/EMG  07/16/2021   NCS/EMGs 06/2021 chronic sensorimotor axonal polyneuropathy (Dr. Posey Pronto)   Farmer FUNDOPLICATION  99/24/2683   POLYPECTOMY  08/11/2017   Procedure: POLYPECTOMY;  Surgeon: Rogene Houston, MD;  [facility];  Service: Endoscopy;;  colon    Family History  Problem Relation Age of Onset   Cancer Mother        bladder   Other  Father        had bladder issues   Social History   Socioeconomic History   Marital status: Married    Spouse name: Not on file   Number of children: Not on file   Years of education: Not on file   Highest education level: Not on file  Occupational History   Not on file  Tobacco Use   Smoking status: Never   Smokeless tobacco: Never  Vaping Use   Vaping Use: Never used  Substance and Sexual Activity   Alcohol use: No   Drug use: No   Sexual activity: Yes    Birth control/protection: Post-menopausal  Other Topics Concern   Not on file  Social History Narrative   Married, lives with husband in [place].  Has two grown children.   Retired from [employer], then un-retired to go back to same job--full time.   No t/a/d.   No  formal exercise.   Normal american diet.   Social Determinants of Health   Financial Resource Strain: Low Risk    Difficulty of Paying Living Expenses: Not hard at all  Food Insecurity: No Food Insecurity   Worried About Charity fundraiser in the Last Year: Never true   [place] in the Last Year: Never true  Transportation Needs: No Transportation Needs   Lack of Transportation (Medical): No   Lack of Transportation (Non-Medical): No  Physical Activity: Inactive   Days of Exercise per Week: 0 days   Minutes of Exercise per Session: 0 min  Stress: No Stress Concern Present   Feeling of Stress : Not at all  Social Connections: Moderately Integrated   Frequency of Communication with Friends and Family: More than three times a week   Frequency of Social Gatherings with Friends and Family: More than three times a week   Attends Religious Services: 1 to 4 times per year   Active Member of [employer] or Organizations: No   Attends Archivist Meetings: Never   Marital Status: Married    Tobacco Counseling Counseling given: Not Answered   Clinical Intake:  Pre-visit preparation completed: Yes  Pain : No/denies pain     BMI  - recorded: 21.83 Nutritional Status: BMI of 19-24  Normal Nutritional Risks: None Diabetes: No  How often do you need to have someone help you when you read instructions, pamphlets, or other written materials from your doctor or pharmacy?: 1 - Never  Diabetic?No  Interpreter Needed?: No  Information entered by :: Charlott Rakes, LPN   Activities of Daily Living In your present state of health, do you have any difficulty performing the following activities: 12/16/2021  Hearing? N  Vision? N  Difficulty concentrating or making decisions? N  Walking or climbing stairs? N  Dressing or bathing? N  Doing errands, shopping? N  Preparing Food and eating ? N  Using the Toilet? N  Managing your Medications? N  Managing your Finances? N  Housekeeping or managing your Housekeeping? N  Some recent data might be hidden    Patient Care Team: Tammi Sou, MD as PCP - General (Family Medicine) Michael Boston, MD as Consulting Physician (General Surgery) Mauri Pole, MD as Consulting Physician (Gastroenterology) Florian Buff, MD as Consulting Physician (Obstetrics and Gynecology) Rogene Houston, MD as Consulting Physician (Gastroenterology) Alda Berthold, DO as Consulting Physician (Neurology) Netta Cedars, MD as Consulting Physician (Orthopedic Surgery)  Indicate any recent Medical Services you may have received from other than [facility] providers in the past year (date may be approximate).     Assessment:   This is a routine wellness examination for [place].  Hearing/Vision screen Hearing Screening - Comments:: Pt denies any hearing issues  Vision Screening - Comments:: Pt follows up with provider for annual eye exams   Dietary issues and exercise activities discussed: Current Exercise Habits: The patient does not participate in regular exercise at present   Goals Addressed             This Visit's Progress    Patient Stated       None at this time         Depression Screen PHQ 2/9 Scores 12/16/2021 09/03/2020 01/26/2020 11/14/2019 11/09/2018 09/01/2018 06/02/2017  PHQ - 2 Score 0 0 0 1 0 0 0    Fall Risk Fall Risk  12/16/2021 05/22/2021 09/03/2020 01/26/2020 11/14/2019  Falls in the past year? 0  1 0 0 0  Number falls in past yr: 0 0 0 - 0  Injury with Fall? 0 0 0 - 0  Risk for fall due to : Impaired vision - - - -  Follow up Falls prevention discussed - Falls prevention discussed - Falls evaluation completed    FALL RISK PREVENTION PERTAINING TO THE HOME:  Any stairs in or around the home? Yes  If so, are there any without handrails? No  Home free of loose throw rugs in walkways, pet beds, electrical cords, etc? Yes  Adequate lighting in your home to reduce risk of falls? Yes   ASSISTIVE DEVICES UTILIZED TO PREVENT FALLS:  Life alert? No  Use of a cane, walker or w/c? No  Grab bars in the bathroom? Yes  Shower chair or bench in shower? No  Elevated toilet seat or a handicapped toilet? No   TIMED UP AND GO:  Was the test performed? No .   Cognitive Function:     6CIT Screen 12/16/2021 09/03/2020  What Year? 0 points 0 points  What month? 0 points 0 points  What time? 0 points 0 points  Count back from 20 0 points 0 points  Months in reverse 0 points 4 points  Repeat phrase 0 points 0 points  Total Score 0 4    Immunizations Immunization History  Administered Date(s) Administered   Fluad Quad(high Dose 65+) 08/14/2019, 07/11/2020, 07/24/2021   Influenza Split 07/31/2012   Influenza, High Dose Seasonal PF 07/31/2015, 08/04/2016, 07/29/2017, 07/17/2018   Influenza,inj,Quad PF,6+ Mos 07/17/2014   Influenza,inj,quad, With Preservative 07/25/2020   PFIZER(Purple Top)SARS-COV-2 Vaccination 01/31/2020, 02/22/2020   Pneumococcal Conjugate-13 08/21/2014   Pneumococcal Polysaccharide-23 12/19/2015   Pneumococcal-Unspecified 10/25/2018   Tdap 07/31/2012   Zoster Recombinat (Shingrix) 01/05/2018, 03/28/2018    TDAP status: Up to  date  Flu Vaccine status: Up to date  Pneumococcal vaccine status: Up to date  Covid-19 vaccine status: Completed vaccines  Qualifies for Shingles Vaccine? Yes   Zostavax completed Yes   Shingrix Completed?: Yes  Screening Tests Health Maintenance  Topic Date Due   Hepatitis C Screening  Never done   COVID-19 Vaccine (3 - Booster for Pfizer series) 04/18/2020   COLONOSCOPY (Pts 45-79yrs Insurance coverage will need to be confirmed)  08/11/2020   TETANUS/TDAP  07/31/2022   Pneumonia Vaccine 52+ Years old  Completed   INFLUENZA VACCINE  Completed   DEXA SCAN  Completed   Zoster Vaccines- Shingrix  Completed   HPV VACCINES  Aged Out    Health Maintenance  Health Maintenance Due  Topic Date Due   Hepatitis C Screening  Never done   COVID-19 Vaccine (3 - Booster for Avalon series) 04/18/2020   COLONOSCOPY (Pts 45-59yrs Insurance coverage will need to be confirmed)  08/11/2020    Colorectal cancer screening: Type of screening: Colonoscopy. Completed 08/11/17. Repeat every 3 years  Mammogram status: Completed 05/14/21. Repeat every year  Bone Density status: Completed 05/14/21. Results reflect: Bone density results: OSTEOPOROSIS. Repeat every 2 years.    Additional Screening:  Hepatitis C Screening: does qualify  Vision Screening: Recommended annual ophthalmology exams for early detection of glaucoma and other disorders of the eye. Is the patient up to date with their annual eye exam?  Yes  Who is the provider or what is the name of the office in which the patient attends annual eye exams? Unsure of providers name If pt is not established with a provider, would they like to be referred to  a provider to establish care? No .   Dental Screening: Recommended annual dental exams for proper oral hygiene  Community Resource Referral / Chronic Care Management: CRR required this visit?  No   CCM required this visit?  No      Plan:     I have personally reviewed and noted  the following in the patients chart:   Medical and social history Use of alcohol, tobacco or illicit drugs  Current medications and supplements including opioid prescriptions.  Functional ability and status Nutritional status Physical activity Advanced directives List of other physicians Hospitalizations, surgeries, and ER visits in previous 12 months Vitals Screenings to include cognitive, depression, and falls Referrals and appointments  In addition, I have reviewed and discussed with patient certain preventive protocols, quality metrics, and best practice recommendations. A written personalized care plan for preventive services as well as general preventive health recommendations were provided to patient.     Willette Brace, LPN   [id number]/[id number]   Nurse Notes: None

## 2021-12-24 ENCOUNTER — Ambulatory Visit (INDEPENDENT_AMBULATORY_CARE_PROVIDER_SITE_OTHER): Payer: Medicare PPO

## 2021-12-24 ENCOUNTER — Other Ambulatory Visit: Payer: Self-pay

## 2021-12-24 DIAGNOSIS — E538 Deficiency of other specified B group vitamins: Secondary | ICD-10-CM | POA: Diagnosis not present

## 2021-12-24 MED ORDER — CYANOCOBALAMIN 1000 MCG/ML IJ SOLN
1000.0000 ug | Freq: Once | INTRAMUSCULAR | Status: AC
  Administered 2021-12-24: 1000 ug via INTRAMUSCULAR

## 2021-12-24 NOTE — Progress Notes (Signed)
Karen Delacruz is a 81 y.o. female presents to the office today for Vitamin B12 per physician's orders.   Vitamin B12 1000mcg IM was administered left deltoid today. Patient tolerated injection. 

## 2022-05-24 ENCOUNTER — Ambulatory Visit (INDEPENDENT_AMBULATORY_CARE_PROVIDER_SITE_OTHER): Payer: Medicare PPO

## 2022-05-24 DIAGNOSIS — E538 Deficiency of other specified B group vitamins: Secondary | ICD-10-CM

## 2022-05-24 MED ORDER — CYANOCOBALAMIN 1000 MCG/ML IJ SOLN
1000.0000 ug | Freq: Once | INTRAMUSCULAR | Status: AC
  Administered 2022-05-24: 1000 ug via INTRAMUSCULAR

## 2022-05-24 NOTE — Progress Notes (Signed)
Karen Delacruz is a 81 y.o. female presents to the office today for Vitamin B12 per physician's orders.   Vitamin B12 1074mg IM was administered right deltoid today. Patient tolerated injection.

## 2022-08-18 ENCOUNTER — Ambulatory Visit (INDEPENDENT_AMBULATORY_CARE_PROVIDER_SITE_OTHER): Payer: Medicare PPO

## 2022-08-18 DIAGNOSIS — E538 Deficiency of other specified B group vitamins: Secondary | ICD-10-CM

## 2022-08-18 MED ORDER — CYANOCOBALAMIN 1000 MCG/ML IJ SOLN
1000.0000 ug | Freq: Once | INTRAMUSCULAR | Status: AC
  Administered 2022-08-18: 1000 ug via INTRAMUSCULAR

## 2022-08-18 NOTE — Progress Notes (Signed)
Karen Delacruz is a 81 y.o. female presents to the office today for Vitamin B12 per physician's orders.   Vitamin B12 1084mg IM was administered rightt deltoid today. Patient tolerated injection.

## 2022-08-19 ENCOUNTER — Encounter: Payer: Self-pay | Admitting: Family Medicine

## 2022-08-19 ENCOUNTER — Ambulatory Visit: Payer: Medicare PPO | Admitting: Family Medicine

## 2022-08-19 VITALS — BP 124/70 | HR 71 | Temp 97.3°F | Ht 67.0 in | Wt 142.0 lb

## 2022-08-19 DIAGNOSIS — E538 Deficiency of other specified B group vitamins: Secondary | ICD-10-CM | POA: Diagnosis not present

## 2022-08-19 DIAGNOSIS — Z0000 Encounter for general adult medical examination without abnormal findings: Secondary | ICD-10-CM

## 2022-08-19 DIAGNOSIS — I10 Essential (primary) hypertension: Secondary | ICD-10-CM | POA: Diagnosis not present

## 2022-08-19 DIAGNOSIS — E559 Vitamin D deficiency, unspecified: Secondary | ICD-10-CM | POA: Diagnosis not present

## 2022-08-19 DIAGNOSIS — Z23 Encounter for immunization: Secondary | ICD-10-CM | POA: Diagnosis not present

## 2022-08-19 NOTE — Progress Notes (Signed)
OFFICE VISIT  08/19/2022  CC:  Chief Complaint  Patient presents with   Hypertension    Patient is a 81 y.o. female with osteoporosis, HTN, vit B12 deficiency, and lower extremities PN who presents accompanied by her husband Eddie Dibbles for f/u of these problems  INTERIM HX: Mirayah says she is doing okay. She takes vitamin D and calcium supplement.  Most recent vitamin B12 injection was yesterday.  Gabapentin did not help her peripheral neuropathy symptoms.  ROS as above, plus--> no fevers, no CP, no SOB, no wheezing, no cough, no dizziness, no HAs, no rashes, no melena/hematochezia.  No polyuria or polydipsia.  No myalgias or arthralgias.  No focal weakness, paresthesias, or tremors.  No acute vision or hearing abnormalities.  No dysuria or unusual/new urinary urgency or frequency.  No recent changes in lower legs. No n/v/d or abd pain.  No palpitations.    Past Medical History:  Diagnosis Date   Abnormal EKG 05/2016   LAFB, voltage criteria for LVH in lead aVL.   Anxiety    Cataract    right   Chronic left shoulder pain    RC impingement + osteoarthritis. as of 09/2020 ortho eval, surg recommended but pt holding off, got steroid inj   Colon cancer screening 06/2017   Cologuard positive--colonoscopy showed adenomatous polyp with high grade dysplasia--recall 3 yrs.   COVID-19 virus infection 11/17/2020   Mild URI   Cystocele, unspecified (CODE) 2018   Pessary fitted at Dr. Brynda Greathouse:  Milex ring with support #3--great improvement.   Degenerative arthritis of cervical spine    Fatty liver    Fibromyalgia    GERD (gastroesophageal reflux disease)    History of hiatal hernia    Hx: UTI (urinary tract infection)    recurrent.  Pyelo hospitalization 2012   Hypertension    Idiopathic urticaria 02/2018   Microhematuria 02/2012   Cystoscopy with bladder biopsy and cystouretograms--pathology benign.   Myalgia    Hx of diffuse myalgias, onset in her 17s   Osteoarthritis of left shoulder     Recurrent L shoulder pain   Osteoporosis 08/10/2016   T-score -2.5: started fosamax 07/2016.  Intol fosamax; started prolia 08/27/2016, most recent prolia was 08/30/2018. DEXA 11/2018 T score -2.3 (improved). 04/2021 DEXA T score -2.4.   Periesophageal hiatal hernia 02/2016   Type 4   Peripheral neuropathy    NCS/EMGs 06/2021 chronic sensorimotor axonal polyneuropathy   Thickened endometrium 10/16/2011   on CT.  F/u pelvic u/s showed nabothian cyst to account for this..  No enometrial pathology noted on u/s, no ovaries visualized.   Vertigo    Vitamin B12 deficiency 11/2015   + LL PN   Vitamin D deficiency     Past Surgical History:  Procedure Laterality Date   BACK SURGERY  1990s x 2   Dr. Leeanne Deed in [place]  approx 2002; 08/11/17   2002; Diverticulosis, int hemorrhoids ([facility]).  2018 (for + cologuard)--adenomatous polyps with high grade dysplasia (recall 3 yrs), also diverticulosis.   COLONOSCOPY N/A 08/11/2017   Procedure: COLONOSCOPY;  Surgeon: Rogene Houston, MD;  Location: [facility];  Service: Endoscopy;  Laterality: N/A;  7:30   DEXA  12/06/2018   T score -2.3 (improved)->continue prolia q10mo  04/2021 T score -2.4.   ESOPHAGEAL MANOMETRY N/A 04/19/2016   Normal.  Procedure: ESOPHAGEAL MANOMETRY (EM);  Surgeon: KMauri Pole MD;  Location: [facility];  Service: Endoscopy;  Laterality: N/A;  EYE SURGERY Right    cataract extraction with IOL   HYSTEROSCOPY  11/2004   Cervical hystogram    INSERTION OF MESH N/A 06/18/2016   Procedure: INSERTION OF MESH;  Surgeon: Michael Boston, MD;  Location: [facility];  Service: General;  Laterality: N/A;   NCS/EMG  07/16/2021   NCS/EMGs 06/2021 chronic sensorimotor axonal polyneuropathy (Dr. Posey Pronto)   Jones FUNDOPLICATION  22/29/7989   POLYPECTOMY  08/11/2017   Procedure: POLYPECTOMY;  Surgeon: Rogene Houston, MD;  Location: [facility];  Service: Endoscopy;;  colon     Outpatient  Medications Prior to Visit  Medication Sig Dispense Refill   amLODipine (NORVASC) 5 MG tablet Take 1 tablet (5 mg total) by mouth daily. OFFICE VISIT NEEDED FOR FURTHER REFILLS 30 tablet 0   Ascorbic Acid (VITAMIN C PO) Take by mouth daily.     aspirin EC 81 MG tablet Take 1 tablet (81 mg total) by mouth. Every other day     chlorthalidone (HYGROTEN) 15 MG tablet Take 1 tablet (15 mg total) by mouth daily. 30 tablet 0   cyanocobalamin (,VITAMIN B-12,) 1000 MCG/ML injection Inject 1 mL (1,000 mcg total) into the muscle once. 1 mL 0   fexofenadine (ALLEGRA) 60 MG tablet Take 1 tablet (60 mg total) by mouth 2 (two) times daily. 60 tablet 1   Multiple Vitamins-Minerals (MULTIVITAMIN ADULTS) TABS Take by mouth daily.     gabapentin (NEURONTIN) 300 MG capsule 1 cap po tid for pain in legs (Patient not taking: Reported on 08/19/2022) 90 capsule 3   No facility-administered medications prior to visit.    Allergies  Allergen Reactions   Ciprofloxacin Nausea And Vomiting   Codeine Nausea And Vomiting and Other (See Comments)    Feels funny   Celebrex [Celecoxib] Rash   Penicillins Rash    Has patient had a PCN reaction causing immediate rash, facial/tongue/throat swelling, SOB or lightheadedness with hypotension: Rash Has patient had a PCN reaction causing severe rash involving mucus membranes or skin necrosis: No Has patient had a PCN reaction that required hospitalization No Has patient had a PCN reaction occurring within the last 10 years: NO If all of the above answers are "NO", then may proceed with Cephalosporin use.    ROS As per HPI  PE:    08/19/2022    2:40 PM 08/31/2021   10:44 AM 08/17/2021   11:30 AM  Vitals with BMI  Height '5\' 7"'$   '5\' 7"'$   Weight 142 lbs 139 lbs 6 oz 142 lbs  BMI 22.24 21.19 41.74  Systolic 081 448 185  Diastolic 70 84 69  Pulse 71 70 61    Physical Exam  Gen: Alert, well appearing.  Patient is oriented to person, place, time, and situation. AFFECT:  pleasant, lucid thought and speech. CV: RRR, no m/r/g.   LUNGS: CTA bilat, nonlabored resps, good aeration in all lung fields. EXT: no clubbing or cyanosis.  no edema.    LABS:  Last CBC Lab Results  Component Value Date   WBC 6.1 03/17/2020   HGB 13.6 03/17/2020   HCT 39.7 03/17/2020   MCV 95.3 03/17/2020   MCH 32.2 06/19/2016   RDW 12.8 03/17/2020   PLT 236.0 63/14/9702   Last metabolic panel Lab Results  Component Value Date   GLUCOSE 89 04/08/2021   NA 143 04/08/2021   K 3.9 04/08/2021   CL 104 04/08/2021   CO2 29 04/08/2021   BUN 8 04/08/2021   CREATININE 0.67 04/08/2021  GFRNONAA >60 06/19/2016   CALCIUM 9.7 04/08/2021   PROT 7.4 05/22/2021   ALBUMIN 4.2 03/17/2020   BILITOT 0.7 03/17/2020   ALKPHOS 73 03/17/2020   AST 18 03/17/2020   ALT 9 03/17/2020   ANIONGAP 6 06/19/2016   Last lipids Lab Results  Component Value Date   CHOL 183 11/09/2018   HDL 58.70 11/09/2018   LDLCALC 100 (H) 11/09/2018   LDLDIRECT 140.1 08/17/2012   TRIG 125.0 11/09/2018   CHOLHDL 3 11/09/2018   Last hemoglobin A1c Lab Results  Component Value Date   HGBA1C 5.7 12/19/2015   Last thyroid functions Lab Results  Component Value Date   TSH 1.46 03/17/2020   T3TOTAL 103.0 03/15/2017   Last vitamin D Lab Results  Component Value Date   VD25OH 24.61 (L) 04/08/2021   Last vitamin B12 and Folate Lab Results  Component Value Date   VITAMINB12 311 05/22/2021   FOLATE >24.4 05/22/2021   IMPRESSION AND PLAN:  #1 hypertension, well controlled on amlodipine 5 mg a day and chlorthalidone 15 mg a day. Basic metabolic panel today.  2.  Vitamin B12 deficiency. Doing well on 1000 mcg B12 IM but she only gets this every few months. Most recent injection was yesterday.  3. osteoporosis: She was on Prolia at 1 point in time. Continue vitamin D and calcium supplement.  History of vitamin D deficiency. Check vitamin D level today. Next DEXA due approximately 12/01/2022.  #4  preventative health care  Last mammogram was 04/2021.-->pt declines and further breast ca screening. She declines any further colon cancer screening.  An After Visit Summary was printed and given to the patient.  FOLLOW UP: Return in about 6 months (around 02/18/2023) for routine chronic illness f/u.  Signed:  Crissie Sickles, MD           08/19/2022

## 2022-08-20 LAB — BASIC METABOLIC PANEL
BUN: 13 mg/dL (ref 6–23)
CO2: 32 mEq/L (ref 19–32)
Calcium: 9.9 mg/dL (ref 8.4–10.5)
Chloride: 101 mEq/L (ref 96–112)
Creatinine, Ser: 0.71 mg/dL (ref 0.40–1.20)
GFR: 80.21 mL/min (ref >60.00)
Glucose, Bld: 87 mg/dL (ref 70–99)
Potassium: 4.5 mEq/L (ref 3.5–5.1)
Sodium: 140 mEq/L (ref 135–145)

## 2022-08-20 LAB — VITAMIN D 25 HYDROXY (VIT D DEFICIENCY, FRACTURES): VITD: 27.98 ng/mL — ABNORMAL LOW (ref 30.00–100.00)

## 2022-08-24 ENCOUNTER — Telehealth: Payer: Self-pay

## 2022-08-24 MED ORDER — VITAMIN D (ERGOCALCIFEROL) 1.25 MG (50000 UNIT) PO CAPS: 50000.0000 [IU] | ORAL_CAPSULE | ORAL | 1 refills | Status: DC

## 2022-08-24 NOTE — Telephone Encounter (Signed)
-----   Message from Tammi Sou, MD sent at 08/23/2022  8:04 AM EDT ----- Vitamin D low. Needs to start vitamin D 50,000 unit tab once a week, #12, refill x1. All remaining labs were normal.

## 2022-09-29 ENCOUNTER — Telehealth: Payer: Self-pay | Admitting: *Deleted

## 2022-09-29 NOTE — Patient Outreach (Signed)
  Care Coordination   09/29/2022 Name: Karen Delacruz MRN: [id number] DOB: 05-04-1941   Care Coordination Outreach Attempts:  An unsuccessful telephone outreach was attempted today to offer the patient information about available care coordination services as a benefit of their health plan.   Follow Up Plan:  Additional outreach attempts will be made to offer the patient care coordination information and services.   Encounter Outcome:  No Answer   Care Coordination Interventions:  No, not indicated    Raina Mina, RN Care Management Coordinator [place] Office [phone]

## 2022-10-21 ENCOUNTER — Telehealth: Payer: Self-pay | Admitting: *Deleted

## 2022-10-21 NOTE — Patient Outreach (Signed)
  Care Coordination   10/21/2022 Name: Karen Delacruz MRN: [id number] DOB: 06/04/1941   Care Coordination Outreach Attempts:  A second unsuccessful outreach was attempted today to offer the patient with information about available care coordination services as a benefit of their health plan.     Follow Up Plan:  Additional outreach attempts will be made to offer the patient care coordination information and services.   Encounter Outcome:  No Answer   Care Coordination Interventions:  No, not indicated    Raina Mina, RN Care Management Coordinator [place] Office [phone]

## 2022-12-01 ENCOUNTER — Ambulatory Visit (INDEPENDENT_AMBULATORY_CARE_PROVIDER_SITE_OTHER): Payer: Medicare PPO

## 2022-12-01 DIAGNOSIS — E538 Deficiency of other specified B group vitamins: Secondary | ICD-10-CM | POA: Diagnosis not present

## 2022-12-01 MED ORDER — CYANOCOBALAMIN 1000 MCG/ML IJ SOLN
1000.0000 ug | Freq: Once | INTRAMUSCULAR | Status: AC
  Administered 2022-12-01: 1000 ug via INTRAMUSCULAR

## 2022-12-02 NOTE — Progress Notes (Signed)
Karen Delacruz is a 82 y.o. female presents to the office today for Vitamin B12 per physician's orders.   Vitamin B12 1073mg IM was administered left deltoid today. Patient tolerated injection.

## 2022-12-13 ENCOUNTER — Telehealth: Payer: Self-pay

## 2022-12-13 NOTE — Telephone Encounter (Signed)
LVM for pt to CB and schedule Medicare Annual Wellness Visit (AWV).   ?

## 2022-12-14 ENCOUNTER — Telehealth: Payer: Self-pay | Admitting: Family Medicine

## 2022-12-14 NOTE — Telephone Encounter (Signed)
Called patient to schedule Medicare Annual Wellness Visit (AWV). Left message for patient to call back and schedule Medicare Annual Wellness Visit (AWV).  Last date of AWV: 12/16/2021  Please schedule an appointment at any time with Otila Kluver, [facility].  If any questions, please contact me at [phone].  Thank you ,  Shaune Pollack [facility] AWV TEAM Direct Dial [phone]

## 2022-12-20 ENCOUNTER — Telehealth: Payer: Self-pay | Admitting: Family Medicine

## 2022-12-20 NOTE — Telephone Encounter (Signed)
Called patient to schedule Medicare Annual Wellness Visit (AWV). Left message for patient to call back and schedule Medicare Annual Wellness Visit (AWV).  Last date of AWV: 12/16/2021  Please schedule an appointment at any time with Otila Kluver, [facility].  If any questions, please contact me at [phone].  Thank you ,  Shaune Pollack [facility] AWV TEAM Direct Dial [phone]

## 2022-12-20 NOTE — Telephone Encounter (Signed)
Contacted [name] to schedule their annual wellness visit. Appointment made for 12/22/2022.  [place] Direct Dial [phone]

## 2022-12-22 ENCOUNTER — Ambulatory Visit (INDEPENDENT_AMBULATORY_CARE_PROVIDER_SITE_OTHER): Payer: Medicare PPO

## 2022-12-22 VITALS — Wt 142.0 lb

## 2022-12-22 DIAGNOSIS — Z0000 Encounter for general adult medical examination without abnormal findings: Secondary | ICD-10-CM

## 2022-12-22 NOTE — Patient Instructions (Signed)
Karen Delacruz , Thank you for taking time to come for your Medicare Wellness Visit. I appreciate your ongoing commitment to your health goals. Please review the following plan we discussed and let me know if I can assist you in the future.   These are the goals we discussed:  Goals      Patient Stated     Maintain current health     Patient Stated     None at this time         This is a list of the screening recommended for you and due dates:  Health Maintenance  Topic Date Due   Colon Cancer Screening  08/11/2020   COVID-19 Vaccine (3 - 2023-24 season) 06/25/2022   DTaP/Tdap/Td vaccine (2 - Td or Tdap) 07/31/2022   Medicare Annual Wellness Visit  12/23/2023   Pneumonia Vaccine  Completed   Flu Shot  Completed   DEXA scan (bone density measurement)  Completed   Zoster (Shingles) Vaccine  Completed   HPV Vaccine  Aged Out    Advanced directives: Advance directive discussed with you today. Even though you declined this today please call our office should you change your mind and we can give you the proper paperwork for you to fill out.  Conditions/risks identified: maintain health   Next appointment: Follow up in one year for your annual wellness visit    Preventive Care 65 Years and Older, Female Preventive care refers to lifestyle choices and visits with your health care provider that can promote health and wellness. What does preventive care include? A yearly physical exam. This is also called an annual well check. Dental exams once or twice a year. Routine eye exams. Ask your health care provider how often you should have your eyes checked. Personal lifestyle choices, including: Daily care of your teeth and gums. Regular physical activity. Eating a healthy diet. Avoiding tobacco and drug use. Limiting alcohol use. Practicing safe sex. Taking low-dose aspirin every day. Taking vitamin and mineral supplements as recommended by your health care provider. What happens  during an annual well check? The services and screenings done by your health care provider during your annual well check will depend on your age, overall health, lifestyle risk factors, and family history of disease. Counseling  Your health care provider may ask you questions about your: Alcohol use. Tobacco use. Drug use. Emotional well-being. Home and relationship well-being. Sexual activity. Eating habits. History of falls. Memory and ability to understand (cognition). Work and work Statistician. Reproductive health. Screening  You may have the following tests or measurements: Height, weight, and BMI. Blood pressure. Lipid and cholesterol levels. These may be checked every 5 years, or more frequently if you are over 58 years old. Skin check. Lung cancer screening. You may have this screening every year starting at age 34 if you have a 30-pack-year history of smoking and currently smoke or have quit within the past 15 years. Fecal occult blood test (FOBT) of the stool. You may have this test every year starting at age 26. Flexible sigmoidoscopy or colonoscopy. You may have a sigmoidoscopy every 5 years or a colonoscopy every 10 years starting at age 50. Hepatitis C blood test. Hepatitis B blood test. Sexually transmitted disease (STD) testing. Diabetes screening. This is done by checking your blood sugar (glucose) after you have not eaten for a while (fasting). You may have this done every 1-3 years. Bone density scan. This is done to screen for osteoporosis. You may have this  done starting at age 95. Mammogram. This may be done every 1-2 years. Talk to your health care provider about how often you should have regular mammograms. Talk with your health care provider about your test results, treatment options, and if necessary, the need for more tests. Vaccines  Your health care provider may recommend certain vaccines, such as: Influenza vaccine. This is recommended every  year. Tetanus, diphtheria, and acellular pertussis (Tdap, Td) vaccine. You may need a Td booster every 10 years. Zoster vaccine. You may need this after age 20. Pneumococcal 13-valent conjugate (PCV13) vaccine. One dose is recommended after age 13. Pneumococcal polysaccharide (PPSV23) vaccine. One dose is recommended after age 81. Talk to your health care provider about which screenings and vaccines you need and how often you need them. This information is not intended to replace advice given to you by your health care provider. Make sure you discuss any questions you have with your health care provider. Document Released: 11/07/2015 Document Revised: 06/30/2016 Document Reviewed: 08/12/2015 Elsevier Interactive Patient Education  2017 [employer] Prevention in the Home Falls can cause injuries. They can happen to people of all ages. There are many things you can do to make your home safe and to help prevent falls. What can I do on the outside of my home? Regularly fix the edges of walkways and driveways and fix any cracks. Remove anything that might make you trip as you walk through a door, such as a raised step or threshold. Trim any bushes or trees on the path to your home. Use bright outdoor lighting. Clear any walking paths of anything that might make someone trip, such as rocks or tools. Regularly check to see if handrails are loose or broken. Make sure that both sides of any steps have handrails. Any raised decks and porches should have guardrails on the edges. Have any leaves, snow, or ice cleared regularly. Use sand or salt on walking paths during winter. Clean up any spills in your garage right away. This includes oil or grease spills. What can I do in the bathroom? Use night lights. Install grab bars by the toilet and in the tub and shower. Do not use towel bars as grab bars. Use non-skid mats or decals in the tub or shower. If you need to sit down in the shower, use a  plastic, non-slip stool. Keep the floor dry. Clean up any water that spills on the floor as soon as it happens. Remove soap buildup in the tub or shower regularly. Attach bath mats securely with double-sided non-slip rug tape. Do not have throw rugs and other things on the floor that can make you trip. What can I do in the bedroom? Use night lights. Make sure that you have a light by your bed that is easy to reach. Do not use any sheets or blankets that are too big for your bed. They should not hang down onto the floor. Have a firm chair that has side arms. You can use this for support while you get dressed. Do not have throw rugs and other things on the floor that can make you trip. What can I do in the kitchen? Clean up any spills right away. Avoid walking on wet floors. Keep items that you use a lot in easy-to-reach places. If you need to reach something above you, use a strong step stool that has a grab bar. Keep electrical cords out of the way. Do not use floor polish or wax  that makes floors slippery. If you must use wax, use non-skid floor wax. Do not have throw rugs and other things on the floor that can make you trip. What can I do with my stairs? Do not leave any items on the stairs. Make sure that there are handrails on both sides of the stairs and use them. Fix handrails that are broken or loose. Make sure that handrails are as long as the stairways. Check any carpeting to make sure that it is firmly attached to the stairs. Fix any carpet that is loose or worn. Avoid having throw rugs at the top or bottom of the stairs. If you do have throw rugs, attach them to the floor with carpet tape. Make sure that you have a light switch at the top of the stairs and the bottom of the stairs. If you do not have them, ask someone to add them for you. What else can I do to help prevent falls? Wear shoes that: Do not have high heels. Have rubber bottoms. Are comfortable and fit you  well. Are closed at the toe. Do not wear sandals. If you use a stepladder: Make sure that it is fully opened. Do not climb a closed stepladder. Make sure that both sides of the stepladder are locked into place. Ask someone to hold it for you, if possible. Clearly mark and make sure that you can see: Any grab bars or handrails. First and last steps. Where the edge of each step is. Use tools that help you move around (mobility aids) if they are needed. These include: Canes. Walkers. Scooters. Crutches. Turn on the lights when you go into a dark area. Replace any light bulbs as soon as they burn out. Set up your furniture so you have a clear path. Avoid moving your furniture around. If any of your floors are uneven, fix them. If there are any pets around you, be aware of where they are. Review your medicines with your doctor. Some medicines can make you feel dizzy. This can increase your chance of falling. Ask your doctor what other things that you can do to help prevent falls. This information is not intended to replace advice given to you by your health care provider. Make sure you discuss any questions you have with your health care provider. Document Released: 08/07/2009 Document Revised: 03/18/2016 Document Reviewed: 11/15/2014 Elsevier Interactive Patient Education  2017 [employer].

## 2022-12-22 NOTE — Progress Notes (Signed)
I connected with  [name] on 12/22/22 by a audio enabled telemedicine application and verified that I am speaking with the correct person using two identifiers. Along with daughter Kenney Houseman  Patient Location: Home  Provider Location: Home Office  I discussed the limitations of evaluation and management by telemedicine. The patient expressed understanding and agreed to proceed.   Subjective:   Karen Delacruz is a 82 y.o. female who presents for Medicare Annual (Subsequent) preventive examination.  Review of Systems     Cardiac Risk Factors include: advanced age (>8mn, >>54women);dyslipidemia     Objective:    Today's Vitals   12/22/22 1455  Weight: 142 lb (64.4 kg)   Body mass index is 22.24 kg/m.     12/22/2022    3:07 PM 12/16/2021    8:06 AM 05/22/2021    9:43 AM 09/03/2020   11:20 AM 08/11/2017    6:45 AM 06/02/2017    9:40 AM 06/18/2016    5:00 PM  Advanced Directives  Does Patient Have a Medical Advance Directive? No No No No No No No  Would patient like information on creating a medical advance directive? No - Patient declined No - Patient declined  Yes (MAU/Ambulatory/Procedural Areas - Information given) No - Patient declined No - Patient declined No - patient declined information    Current Medications (verified) Outpatient Encounter Medications as of 12/22/2022  Medication Sig   amLODipine (NORVASC) 5 MG tablet Take 1 tablet (5 mg total) by mouth daily. OFFICE VISIT NEEDED FOR FURTHER REFILLS   Ascorbic Acid (VITAMIN C PO) Take by mouth daily.   aspirin EC 81 MG tablet Take 1 tablet (81 mg total) by mouth. Every other day   chlorthalidone (HYGROTEN) 15 MG tablet Take 1 tablet (15 mg total) by mouth daily.   cyanocobalamin (,VITAMIN B-12,) 1000 MCG/ML injection Inject 1 mL (1,000 mcg total) into the muscle once.   fexofenadine (ALLEGRA) 60 MG tablet Take 1 tablet (60 mg total) by mouth 2 (two) times daily.   Multiple Vitamins-Minerals (MULTIVITAMIN ADULTS)  TABS Take by mouth daily.   Vitamin D, Ergocalciferol, (DRISDOL) 1.25 MG (50000 UNIT) CAPS capsule Take 1 capsule (50,000 Units total) by mouth every 7 (seven) days.   [DISCONTINUED] gabapentin (NEURONTIN) 300 MG capsule 1 cap po tid for pain in legs (Patient not taking: Reported on 08/19/2022)   No facility-administered encounter medications on file as of 12/22/2022.    Allergies (verified) Ciprofloxacin, Codeine, Celebrex [celecoxib], and Penicillins   History: Past Medical History:  Diagnosis Date   Abnormal EKG 05/2016   LAFB, voltage criteria for LVH in lead aVL.   Anxiety    Cataract    right   Chronic left shoulder pain    RC impingement + osteoarthritis. as of 09/2020 ortho eval, surg recommended but pt holding off, got steroid inj   Colon cancer screening 06/2017   Cologuard positive--colonoscopy showed adenomatous polyp with high grade dysplasia--recall 3 yrs.   COVID-19 virus infection 11/17/2020   Mild URI   Cystocele, unspecified (CODE) 2018   Pessary fitted at Dr. EBrynda Greathouse  Milex ring with support #3--great improvement.   Degenerative arthritis of cervical spine    Fatty liver    Fibromyalgia    GERD (gastroesophageal reflux disease)    History of hiatal hernia    Hx: UTI (urinary tract infection)    recurrent.  Pyelo hospitalization 2012   Hypertension    Idiopathic urticaria 02/2018   Microhematuria 02/2012   Cystoscopy  with bladder biopsy and cystouretograms--pathology benign.   Myalgia    Hx of diffuse myalgias, onset in her 52s   Osteoarthritis of left shoulder    Recurrent L shoulder pain   Osteoporosis 08/10/2016   T-score -2.5: started fosamax 07/2016.  Intol fosamax; started prolia 08/27/2016, most recent prolia was 08/30/2018. DEXA 11/2018 T score -2.3 (improved). 04/2021 DEXA T score -2.4.   Periesophageal hiatal hernia 02/2016   Type 4   Peripheral neuropathy    NCS/EMGs 06/2021 chronic sensorimotor axonal polyneuropathy. Gabapentin no help    Thickened endometrium 10/16/2011   on CT.  F/u pelvic u/s showed nabothian cyst to account for this..  No enometrial pathology noted on u/s, no ovaries visualized.   Vertigo    Vitamin B12 deficiency 11/2015   + LL PN   Vitamin D deficiency    Past Surgical History:  Procedure Laterality Date   BACK SURGERY  1990s x 2   Dr. Leeanne Deed in [place]  approx 2002; 08/11/17   2002; Diverticulosis, int hemorrhoids ([facility]).  2018 (for + cologuard)--adenomatous polyps with high grade dysplasia (recall 3 yrs), also diverticulosis.   COLONOSCOPY N/A 08/11/2017   Procedure: COLONOSCOPY;  Surgeon: Rogene Houston, MD;  [facility];  Service: Endoscopy;  Laterality: N/A;  7:30   DEXA  12/06/2018   T score -2.3 (improved)->continue prolia q6mo  04/2021 T score -2.4.   ESOPHAGEAL MANOMETRY N/A 04/19/2016   Normal.  Procedure: ESOPHAGEAL MANOMETRY (EM);  Surgeon: KMauri Pole MD;  Location: [facility];  Service: Endoscopy;  Laterality: N/A;   EYE SURGERY Right    cataract extraction with IOL   HYSTEROSCOPY  11/2004   Cervical hystogram    INSERTION OF MESH N/A 06/18/2016   Procedure: INSERTION OF MESH;  Surgeon: SMichael Boston MD;  Location: [facility];  Service: General;  Laterality: N/A;   NCS/EMG  07/16/2021   NCS/EMGs 06/2021 chronic sensorimotor axonal polyneuropathy (Dr. PPosey Pronto   NPaceFUNDOPLICATION  [id number]  POLYPECTOMY  08/11/2017   Procedure: POLYPECTOMY;  Surgeon: RRogene Houston MD;  [facility];  Service: Endoscopy;;  colon    Family History  Problem Relation Age of Onset   Cancer Mother        bladder   Other Father        had bladder issues   Social History   Socioeconomic History   Marital status: Married    Spouse name: Not on file   Number of children: Not on file   Years of education: Not on file   Highest education level: Not on file  Occupational History   Not on file  Tobacco Use   Smoking  status: Never   Smokeless tobacco: Never  Vaping Use   Vaping Use: Never used  Substance and Sexual Activity   Alcohol use: No   Drug use: No   Sexual activity: Yes    Birth control/protection: Post-menopausal  Other Topics Concern   Not on file  Social History Narrative   Mar[place]  Has two grown children.   Retired from [employer] then un-retired to go back to same job--full time.   No t/a/d.   No formal exercise.   Normal american diet.   Social Determinants of Health   Financial Resource Strain: Low Risk  (12/22/2022)   Overall Financial Resource Strain (CARDIA)    Difficulty of Paying Living Expenses: Not hard  at all  Food Insecurity: No Food Insecurity (12/22/2022)   Hunger Vital Sign    Worried About Running Out of Food in the Last Year: Never true    Ran Out of Food in the Last Year: Never true  Transportation Needs: No Transportation Needs (12/22/2022)   PRAPARE - Hydrologist (Medical): No    Lack of Transportation (Non-Medical): No  Physical Activity: Inactive (12/22/2022)   Exercise Vital Sign    Days of Exercise per Week: 0 days    Minutes of Exercise per Session: 0 min  Stress: No Stress Concern Present (12/22/2022)   [place]    Feeling of Stress : Not at all  Social Connections: Moderately Isolated (12/22/2022)   Social Connection and Isolation Panel [NHANES]    Frequency of Communication with Friends and Family: More than three times a week    Frequency of Social Gatherings with Friends and Family: More than three times a week    Attends Religious Services: Never    Marine scientist or Organizations: No    Attends Music therapist: Never    Marital Status: Married    Tobacco Counseling Counseling given: Not Answered   Clinical Intake:  Pre-visit preparation completed: Yes  Pain : No/denies pain      BMI - recorded: 22.24 Nutritional Status: BMI of 19-24  Normal Nutritional Risks: None Diabetes: No  How often do you need to have someone help you when you read instructions, pamphlets, or other written materials from your doctor or pharmacy?: 1 - Never  Diabetic?no  Interpreter Needed?: No  Information entered by :: Charlott Rakes, LPN   Activities of Daily Living    12/22/2022    3:08 PM  In your present state of health, do you have any difficulty performing the following activities:  Hearing? 0  Vision? 0  Difficulty concentrating or making decisions? 0  Walking or climbing stairs? 0  Dressing or bathing? 0  Doing errands, shopping? 0  Preparing Food and eating ? N  Using the Toilet? N  In the past six months, have you accidently leaked urine? N  Do you have problems with loss of bowel control? N  Managing your Medications? N  Managing your Finances? N  Housekeeping or managing your Housekeeping? N    Patient Care Team: Tammi Sou, MD as PCP - General (Family Medicine) Michael Boston, MD as Consulting Physician (General Surgery) Mauri Pole, MD as Consulting Physician (Gastroenterology) Florian Buff, MD as Consulting Physician (Obstetrics and Gynecology) Rogene Houston, MD as Consulting Physician (Gastroenterology) Alda Berthold, DO as Consulting Physician (Neurology) Netta Cedars, MD as Consulting Physician (Orthopedic Surgery)  Indicate any recent Medical Services you may have received from other than [facility] providers in the past year (date may be approximate).     Assessment:   This is a routine wellness examination for [place].  Hearing/Vision screen Hearing Screening - Comments:: Pt denies any hearing issues  Vision Screening - Comments:: Pt follows with Dr [facility]   Dietary issues and exercise activities discussed: Current Exercise Habits: The patient does not participate in regular  exercise at present   Goals Addressed             This Visit's Progress    Patient Stated       Stay healthy and active  Depression Screen    12/22/2022    3:06 PM 12/16/2021    8:05 AM 09/03/2020   11:23 AM 01/26/2020    8:47 AM 11/14/2019    3:09 PM 11/09/2018    8:10 AM 09/01/2018    3:53 PM  PHQ 2/9 Scores  PHQ - 2 Score 0 0 0 0 1 0 0    Fall Risk    12/22/2022    3:08 PM 12/16/2021    8:08 AM 05/22/2021    9:43 AM 09/03/2020   11:22 AM 01/26/2020    8:47 AM  Fall Risk   Falls in the past year? 0 0 1 0 0  Number falls in past yr: 0 0 0 0   Injury with Fall? 0 0 0 0   Risk for fall due to : Impaired vision Impaired vision     Follow up Falls prevention discussed Falls prevention discussed  Falls prevention discussed     FALL RISK PREVENTION PERTAINING TO THE HOME:  Any stairs in or around the home? Yes  If so, are there any without handrails? No  Home free of loose throw rugs in walkways, pet beds, electrical cords, etc? Yes  Adequate lighting in your home to reduce risk of falls? Yes   ASSISTIVE DEVICES UTILIZED TO PREVENT FALLS:  Life alert? No  Use of a cane, walker or w/c? No  Grab bars in the bathroom? Yes  Shower chair or bench in shower? No  Elevated toilet seat or a handicapped toilet? No   TIMED UP AND GO:  Was the test performed? No .   Cognitive Function:        12/22/2022    3:10 PM 12/16/2021    8:10 AM 09/03/2020   11:28 AM  6CIT Screen  What Year? 0 points 0 points 0 points  What month? 0 points 0 points 0 points  What time? 0 points 0 points 0 points  Count back from 20 0 points 0 points 0 points  Months in reverse 4 points 0 points 4 points  Repeat phrase 4 points 0 points 0 points  Total Score 8 points 0 points 4 points    Immunizations Immunization History  Administered Date(s) Administered   Fluad Quad(high Dose 65+) 08/14/2019, 07/11/2020, 07/24/2021, 08/19/2022   Influenza Split 07/31/2012   Influenza, High Dose  Seasonal PF 07/31/2015, 08/04/2016, 07/29/2017, 07/17/2018   Influenza,inj,Quad PF,6+ Mos 07/17/2014   Influenza,inj,quad, With Preservative 07/25/2020   PFIZER(Purple Top)SARS-COV-2 Vaccination 01/31/2020, 02/22/2020   Pneumococcal Conjugate-13 08/21/2014   Pneumococcal Polysaccharide-23 12/19/2015   Pneumococcal-Unspecified 10/25/2018   Tdap 07/31/2012   Zoster Recombinat (Shingrix) 01/05/2018, 03/28/2018    TDAP status: Due, Education has been provided regarding the importance of this vaccine. Advised may receive this vaccine at local pharmacy or Health Dept. Aware to provide a copy of the vaccination record if obtained from local pharmacy or Health Dept. Verbalized acceptance and understanding.  Flu Vaccine status: Up to date  Pneumococcal vaccine status: Up to date  Covid-19 vaccine status: Completed vaccines  Qualifies for Shingles Vaccine? Yes   Zostavax completed Yes   Shingrix Completed?: Yes  Screening Tests Health Maintenance  Topic Date Due   COVID-19 Vaccine (3 - 2023-24 season) 06/25/2022   DTaP/Tdap/Td (2 - Td or Tdap) 07/31/2022   COLONOSCOPY (Pts 45-22yr Insurance coverage will need to be confirmed)  12/23/2023 (Originally 08/11/2020)   Medicare Annual Wellness ([place]  12/23/2023   Pneumonia Vaccine 82 Years old  Completed   INFLUENZA  VACCINE  Completed   DEXA SCAN  Completed   Zoster Vaccines- Shingrix  Completed   HPV VACCINES  Aged Out    Health Maintenance  Health Maintenance Due  Topic Date Due   COVID-19 Vaccine (3 - 2023-24 season) 06/25/2022   DTaP/Tdap/Td (2 - Td or Tdap) 07/31/2022    Colorectal cancer screening: No longer required.   Mammogram status: No longer required due to per pt .  Bone Density status: Completed 05/14/21. Results reflect: Bone density results: OSTEOPOROSIS. Repeat every 2 years.   Additional Screening:   Vision Screening: Recommended annual ophthalmology exams for early detection of glaucoma and other disorders  of the eye. Is the patient up to date with their annual eye exam?  Yes  Who is the provider or what is the name of the office in which the patient attends [facility]  If pt is not established with a provider, would they like to be referred to a provider to establish care? No .   Dental Screening: Recommended annual dental exams for proper oral hygiene  Community Resource Referral / Chronic Care Management: CRR required this visit?  No   CCM required this visit?  No      Plan:     I have personally reviewed and noted the following in the patient's chart:   Medical and social history Use of alcohol, tobacco or illicit drugs  Current medications and supplements including opioid prescriptions. Patient is not currently taking opioid prescriptions. Functional ability and status Nutritional status Physical activity Advanced directives List of other physicians Hospitalizations, surgeries, and ER visits in previous 12 months Vitals Screenings to include cognitive, depression, and falls Referrals and appointments  In addition, I have reviewed and discussed with patient certain preventive protocols, quality metrics, and best practice recommendations. A written personalized care plan for preventive services as well as general preventive health recommendations were provided to patient.     Willette Brace, LPN   [id number]   Nurse Notes: none

## 2023-01-07 ENCOUNTER — Ambulatory Visit: Payer: Medicare PPO | Admitting: Family Medicine

## 2023-01-07 ENCOUNTER — Encounter: Payer: Self-pay | Admitting: Family Medicine

## 2023-01-07 VITALS — BP 121/75 | HR 69 | Temp 97.9°F | Ht 67.0 in | Wt 143.4 lb

## 2023-01-07 DIAGNOSIS — S20211A Contusion of right front wall of thorax, initial encounter: Secondary | ICD-10-CM | POA: Diagnosis not present

## 2023-01-07 NOTE — Progress Notes (Signed)
OFFICE VISIT  01/07/2023  CC:  Chief Complaint  Patient presents with   Back Pain    Mid back, fall last Thursday; no bruising currently.  Daughter gave her otc Tylenol similar to Tylenol PM, last dose 2 nights ago. Current pain level is 5/10.     Patient is a 82 y.o. female who presents for back pain.  HPI: [place] 8 d/a, dog ran between her legs and she fell over between a chair and the counter, hit her right mid back into the counter.  The pain does not radiate. No pain in the spine or hips  Past Medical History:  Diagnosis Date   Abnormal EKG 05/2016   LAFB, voltage criteria for LVH in lead aVL.   Anxiety    Cataract    right   Chronic left shoulder pain    RC impingement + osteoarthritis. as of 09/2020 ortho eval, surg recommended but pt holding off, got steroid inj   Colon cancer screening 06/2017   Cologuard positive--colonoscopy showed adenomatous polyp with high grade dysplasia--recall 3 yrs.   COVID-19 virus infection 11/17/2020   Mild URI   Cystocele, unspecified (CODE) 2018   Pessary fitted at Dr. Brynda Greathouse:  Milex ring with support #3--great improvement.   Degenerative arthritis of cervical spine    Fatty liver    Fibromyalgia    GERD (gastroesophageal reflux disease)    History of hiatal hernia    Hx: UTI (urinary tract infection)    recurrent.  Pyelo hospitalization 2012   Hypertension    Idiopathic urticaria 02/2018   Microhematuria 02/2012   Cystoscopy with bladder biopsy and cystouretograms--pathology benign.   Myalgia    Hx of diffuse myalgias, onset in her 88s   Osteoarthritis of left shoulder    Recurrent L shoulder pain   Osteoporosis 08/10/2016   T-score -2.5: started fosamax 07/2016.  Intol fosamax; started prolia 08/27/2016, most recent prolia was 08/30/2018. DEXA 11/2018 T score -2.3 (improved). 04/2021 DEXA T score -2.4.   Periesophageal hiatal hernia 02/2016   Type 4   Peripheral neuropathy    NCS/EMGs 06/2021 chronic sensorimotor axonal  polyneuropathy. Gabapentin no help   Thickened endometrium 10/16/2011   on CT.  F/u pelvic u/s showed nabothian cyst to account for this..  No enometrial pathology noted on u/s, no ovaries visualized.   Vertigo    Vitamin B12 deficiency 11/2015   + LL PN   Vitamin D deficiency     Past Surgical History:  Procedure Laterality Date   BACK SURGERY  1990s x 2   Dr. Leeanne Deed in [place]  approx 2002; 08/11/17   2002; Diverticulosis, int hemorrhoids ([facility]).  2018 (for + cologuard)--adenomatous polyps with high grade dysplasia (recall 3 yrs), also diverticulosis.   COLONOSCOPY N/A 08/11/2017   Procedure: COLONOSCOPY;  Surgeon: Rogene Houston, MD;  [facility];  Service: Endoscopy;  Laterality: N/A;  7:30   DEXA  12/06/2018   T score -2.3 (improved)->continue prolia q3mo.  04/2021 T score -2.4.   ESOPHAGEAL MANOMETRY N/A 04/19/2016   Normal.  Procedure: ESOPHAGEAL MANOMETRY (EM);  Surgeon: Mauri Pole, MD;  [facility];  Service: Endoscopy;  Laterality: N/A;   EYE SURGERY Right    cataract extraction with IOL   HYSTEROSCOPY  11/2004   Cervical hystogram    INSERTION OF MESH N/A 06/18/2016   Procedure: INSERTION OF MESH;  Surgeon: Michael Boston, MD;  [facility];  Service:  General;  Laterality: N/A;   NCS/EMG  07/16/2021   NCS/EMGs 06/2021 chronic sensorimotor axonal polyneuropathy (Dr. Posey Pronto)   NISSEN FUNDOPLICATION  [id number]   POLYPECTOMY  08/11/2017   Procedure: POLYPECTOMY;  Surgeon: Rogene Houston, MD;  [facility];  Service: Endoscopy;;  colon     Outpatient Medications Prior to Visit  Medication Sig Dispense Refill   amLODipine (NORVASC) 5 MG tablet Take 1 tablet (5 mg total) by mouth daily. OFFICE VISIT NEEDED FOR FURTHER REFILLS 30 tablet 0   Ascorbic Acid (VITAMIN C PO) Take by mouth daily.     aspirin EC 81 MG tablet Take 1 tablet (81 mg total) by mouth. Every other day     chlorthalidone  (HYGROTEN) 15 MG tablet Take 1 tablet (15 mg total) by mouth daily. 30 tablet 0   cyanocobalamin (,VITAMIN B-12,) 1000 MCG/ML injection Inject 1 mL (1,000 mcg total) into the muscle once. 1 mL 0   fexofenadine (ALLEGRA) 60 MG tablet Take 1 tablet (60 mg total) by mouth 2 (two) times daily. 60 tablet 1   Multiple Vitamins-Minerals (MULTIVITAMIN ADULTS) TABS Take by mouth daily.     Vitamin D, Ergocalciferol, (DRISDOL) 1.25 MG (50000 UNIT) CAPS capsule Take 1 capsule (50,000 Units total) by mouth every 7 (seven) days. 12 capsule 1   No facility-administered medications prior to visit.    Allergies  Allergen Reactions   Ciprofloxacin Nausea And Vomiting   Codeine Nausea And Vomiting and Other (See Comments)    Feels funny   Celebrex [Celecoxib] Rash   Penicillins Rash    Has patient had a PCN reaction causing immediate rash, facial/tongue/throat swelling, SOB or lightheadedness with hypotension: Rash Has patient had a PCN reaction causing severe rash involving mucus membranes or skin necrosis: No Has patient had a PCN reaction that required hospitalization No Has patient had a PCN reaction occurring within the last 10 years: NO If all of the above answers are "NO", then may proceed with Cephalosporin use.    Review of Systems  As per HPI  PE:    01/07/2023    3:32 PM 01/07/2023    3:15 PM 12/22/2022    2:55 PM  Vitals with BMI  Height  5\' 7"    Weight  143 lbs 6 oz 142 lbs  BMI  0000000   Systolic [id number]   Diastolic 75 95   Pulse  69      Physical Exam  Gen: Alert, well appearing.  Patient is oriented to person, place, time, and situation. Back: No midline or paraspinous tenderness to palpation.  No bruising or swelling.  She has some tenderness to palpation over the posterolateral lower ribs on the right.  LABS:  Last CBC Lab Results  Component Value Date   WBC 6.1 03/17/2020   HGB 13.6 03/17/2020   HCT 39.7 03/17/2020   MCV 95.3 03/17/2020   MCH 32.2 06/19/2016   RDW  12.8 03/17/2020   PLT 236.0 [id number]   Last metabolic panel Lab Results  Component Value Date   GLUCOSE 87 08/19/2022   NA 140 08/19/2022   K 4.5 08/19/2022   CL 101 08/19/2022   CO2 32 08/19/2022   BUN 13 08/19/2022   CREATININE 0.71 08/19/2022   GFRNONAA >60 06/19/2016   CALCIUM 9.9 08/19/2022   PROT 7.4 05/22/2021   ALBUMIN 4.2 03/17/2020   BILITOT 0.7 03/17/2020   ALKPHOS 73 03/17/2020   AST 18 03/17/2020   ALT 9 03/17/2020   ANIONGAP  6 06/19/2016    Last vitamin D Lab Results  Component Value Date   VD25OH 27.98 (L) 08/19/2022   IMPRESSION AND PLAN:  Posterolateral ribs/chest wall contusion. Rule out rib fracture--radiograph ordered today. Tylenol every 6 hours as needed.  An After Visit Summary was printed and given to the patient.  FOLLOW UP: Return if symptoms worsen or fail to improve.  Signed:  Crissie Sickles, MD           01/07/2023

## 2023-02-18 ENCOUNTER — Ambulatory Visit: Payer: Medicare PPO | Admitting: Family Medicine

## 2023-03-28 ENCOUNTER — Ambulatory Visit: Payer: Medicare PPO | Admitting: Family Medicine

## 2023-03-28 ENCOUNTER — Encounter: Payer: Self-pay | Admitting: Family Medicine

## 2023-03-28 VITALS — BP 126/78 | HR 73 | Ht 67.0 in | Wt 138.4 lb

## 2023-03-28 DIAGNOSIS — I10 Essential (primary) hypertension: Secondary | ICD-10-CM | POA: Diagnosis not present

## 2023-03-28 DIAGNOSIS — R52 Pain, unspecified: Secondary | ICD-10-CM | POA: Diagnosis not present

## 2023-03-28 DIAGNOSIS — R531 Weakness: Secondary | ICD-10-CM | POA: Diagnosis not present

## 2023-03-28 DIAGNOSIS — E538 Deficiency of other specified B group vitamins: Secondary | ICD-10-CM

## 2023-03-28 LAB — POC COVID19 BINAXNOW: SARS Coronavirus 2 Ag: NEGATIVE

## 2023-03-28 NOTE — Progress Notes (Signed)
OFFICE VISIT  03/28/2023  CC:  Chief Complaint  Patient presents with   Arm Discomfort    Bilateral, radiating to fingers and legs, numbness in legs from neuropathy    Patient is a 82 y.o. female who presents for bilateral arm discomfort.  HPI: Acute onset 4 days ago aching in every area of her body and severe fatigue.  Finds it hard to even get out of bed.  No fever or rash.  The pain is generalized and not focal to just the muscles or just the joints. Food did not taste very good.  No URI symptoms, headaches, or sore throat. A respiratory virus has been going through the house over the last 6 weeks or so. 2 days ago they did a home COVID test and it was equivocal.  ROS as above, plus--> no fevers, no CP, no SOB, no wheezing, no cough, no dizziness, no HAs, no rashes, no melena/hematochezia.  No polyuria or polydipsia. No acute vision or hearing abnormalities.  No dysuria or unusual/new urinary urgency or frequency.  No recent changes in lower legs. No n/v/d or abd pain.  No palpitations.    Past Medical History:  Diagnosis Date   Abnormal EKG 05/2016   LAFB, voltage criteria for LVH in lead aVL.   Anxiety    Cataract    right   Chronic left shoulder pain    RC impingement + osteoarthritis. as of 09/2020 ortho eval, surg recommended but pt holding off, got steroid inj   Colon cancer screening 06/2017   Cologuard positive--colonoscopy showed adenomatous polyp with high grade dysplasia--recall 3 yrs.   COVID-19 virus infection 11/17/2020   Mild URI   Cystocele, unspecified (CODE) 2018   Pessary fitted at Dr. Forestine Chute:  Milex ring with support #3--great improvement.   Degenerative arthritis of cervical spine    Fatty liver    Fibromyalgia    GERD (gastroesophageal reflux disease)    History of hiatal hernia    Hx: UTI (urinary tract infection)    recurrent.  Pyelo hospitalization 2012   Hypertension    Idiopathic urticaria 02/2018   Microhematuria 02/2012   Cystoscopy with  bladder biopsy and cystouretograms--pathology benign.   Myalgia    Hx of diffuse myalgias, onset in her 60s   Osteoarthritis of left shoulder    Recurrent L shoulder pain   Osteoporosis 08/10/2016   T-score -2.5: started fosamax 07/2016.  Intol fosamax; started prolia 08/27/2016, most recent prolia was 08/30/2018. DEXA 11/2018 T score -2.3 (improved). 04/2021 DEXA T score -2.4.   Periesophageal hiatal hernia 02/2016   Type 4   Peripheral neuropathy    NCS/EMGs 06/2021 chronic sensorimotor axonal polyneuropathy. Gabapentin no help   Thickened endometrium 10/16/2011   on CT.  F/u pelvic u/s showed nabothian cyst to account for this..  No enometrial pathology noted on u/s, no ovaries visualized.   Vertigo    Vitamin B12 deficiency 11/2015   + LL PN   Vitamin D deficiency      Past Surgical History:  Procedure Laterality Date   BACK SURGERY  1990s x 2   Dr. Jeannine Kitten in [place]   CHOLECYSTECTOMY     COLONOSCOPY  approx 2002; 08/11/17   2002; Diverticulosis, int hemorrhoids ([facility]).  2018 (for + cologuard)--adenomatous polyps with high grade dysplasia (recall 3 yrs), also diverticulosis.   COLONOSCOPY N/A 08/11/2017   Procedure: COLONOSCOPY;  Surgeon: Malissa Hippo, MD;  Location: [facility];  Service: Endoscopy;  Laterality: N/A;  7:30  DEXA  12/06/2018   T score -2.3 (improved)->continue prolia q73mo.  04/2021 T score -2.4.   ESOPHAGEAL MANOMETRY N/A 04/19/2016   Normal.  Procedure: ESOPHAGEAL MANOMETRY (EM);  Surgeon: Napoleon Form, MD;  Location: [facility];  Service: Endoscopy;  Laterality: N/A;   EYE SURGERY Right    cataract extraction with IOL   HYSTEROSCOPY  11/2004   Cervical hystogram    INSERTION OF MESH N/A 06/18/2016   Procedure: INSERTION OF MESH;  Surgeon: Karie Soda, MD;  Location: [facility];  Service: General;  Laterality: N/A;   NCS/EMG  07/16/2021   NCS/EMGs 06/2021 chronic sensorimotor axonal polyneuropathy (Dr. Allena Katz)   NISSEN FUNDOPLICATION  06/18/2016    POLYPECTOMY  08/11/2017   Procedure: POLYPECTOMY;  Surgeon: Malissa Hippo, MD;  Location: [facility];  Service: Endoscopy;;  colon     Outpatient Medications Prior to Visit  Medication Sig Dispense Refill   amLODipine (NORVASC) 5 MG tablet Take 1 tablet (5 mg total) by mouth daily. OFFICE VISIT NEEDED FOR FURTHER REFILLS 30 tablet 0   Ascorbic Acid (VITAMIN C PO) Take by mouth daily.     aspirin EC 81 MG tablet Take 1 tablet (81 mg total) by mouth. Every other day     chlorthalidone (HYGROTEN) 15 MG tablet Take 1 tablet (15 mg total) by mouth daily. 30 tablet 0   cyanocobalamin (,VITAMIN B-12,) 1000 MCG/ML injection Inject 1 mL (1,000 mcg total) into the muscle once. 1 mL 0   fexofenadine (ALLEGRA) 60 MG tablet Take 1 tablet (60 mg total) by mouth 2 (two) times daily. 60 tablet 1   Multiple Vitamins-Minerals (MULTIVITAMIN ADULTS) TABS Take by mouth daily.     Vitamin D, Ergocalciferol, (DRISDOL) 1.25 MG (50000 UNIT) CAPS capsule Take 1 capsule (50,000 Units total) by mouth every 7 (seven) days. 12 capsule 1   No facility-administered medications prior to visit.    Allergies  Allergen Reactions   Ciprofloxacin Nausea And Vomiting   Codeine Nausea And Vomiting and Other (See Comments)    Feels funny   Celebrex [Celecoxib] Rash   Penicillins Rash    Has patient had a PCN reaction causing immediate rash, facial/tongue/throat swelling, SOB or lightheadedness with hypotension: Rash Has patient had a PCN reaction causing severe rash involving mucus membranes or skin necrosis: No Has patient had a PCN reaction that required hospitalization No Has patient had a PCN reaction occurring within the last 10 years: NO If all of the above answers are "NO", then may proceed with Cephalosporin use.    Review of Systems  As per HPI  PE:    03/28/2023    3:02 PM 03/28/2023    2:45 PM 01/07/2023    3:32 PM  Vitals with BMI  Height  5\' 7"    Weight  138 lbs 6 oz   BMI  21.67   Systolic 126  143 121  Diastolic 78 93 75  Pulse  73      Physical Exam  General : alert, tired appearing but pleasant and in no distress. ZOX:WRUE: no injection, icteris, swelling, or exudate.  EOMI, PERRLA. Mouth: lips without lesion/swelling.  Oral mucosa pink and moist. Oropharynx without erythema, exudate, or swelling.  Neck: no adenopathy or TM CV: RRR, no m/r/g.   LUNGS: CTA bilat, nonlabored resps, good aeration in all lung fields. She is tender to palpation essentially from neck down into feet bilaterally.  She is generally weak, needs assist in getting up onto the exam table.  No focal weakness.  No sensory abnormality.  No tremor.  LABS:  Last CBC Lab Results  Component Value Date   WBC 6.1 03/17/2020   HGB 13.6 03/17/2020   HCT 39.7 03/17/2020   MCV 95.3 03/17/2020   MCH 32.2 06/19/2016   RDW 12.8 03/17/2020   PLT 236.0 03/17/2020   Last metabolic panel Lab Results  Component Value Date   GLUCOSE 87 08/19/2022   NA 140 08/19/2022   K 4.5 08/19/2022   CL 101 08/19/2022   CO2 32 08/19/2022   BUN 13 08/19/2022   CREATININE 0.71 08/19/2022   GFRNONAA >60 06/19/2016   CALCIUM 9.9 08/19/2022   PROT 7.4 05/22/2021   ALBUMIN 4.2 03/17/2020   BILITOT 0.7 03/17/2020   ALKPHOS 73 03/17/2020   AST 18 03/17/2020   ALT 9 03/17/2020   ANIONGAP 6 06/19/2016   Last lipids Lab Results  Component Value Date   CHOL 183 11/09/2018   HDL 58.70 11/09/2018   LDLCALC 100 (H) 11/09/2018   LDLDIRECT 140.1 08/17/2012   TRIG 125.0 11/09/2018   CHOLHDL 3 11/09/2018   Last hemoglobin A1c Lab Results  Component Value Date   HGBA1C 5.7 12/19/2015   Last thyroid functions Lab Results  Component Value Date   TSH 1.46 03/17/2020   T3TOTAL 103.0 03/15/2017   Last vitamin D Lab Results  Component Value Date   VD25OH 27.98 (L) 08/19/2022   Last vitamin B12 and Folate Lab Results  Component Value Date   VITAMINB12 311 05/22/2021   FOLATE >24.4 05/22/2021   IMPRESSION AND  PLAN:  #1 acute generalized body pain and severe fatigue. She does have a history of fibromyalgia. Question recent viral syndrome triggering this (??). Rapid COVID test here in the office today was NEG. Checking CBC with differential, c-Met, sed rate, CRP, CPK. Rest, hydrate, Tylenol.  2.  Vitamin B12 deficiency: She has not had an injection in 4 months but we will hold off today given the current clinical situation. Check vitamin B12 level today.  An After Visit Summary was printed and given to the patient.  FOLLOW UP: Return in about 4 days (around 04/01/2023) for f/u body aches and fatigue.  Signed:  Santiago Bumpers, MD           03/28/2023

## 2023-03-28 NOTE — Addendum Note (Signed)
Addended by: Barnet Glasgow on: 03/28/2023 03:32 PM   Modules accepted: Orders

## 2023-03-28 NOTE — Addendum Note (Signed)
Addended by: Emi Holes D on: 03/28/2023 03:32 PM   Modules accepted: Orders

## 2023-03-29 LAB — COMPREHENSIVE METABOLIC PANEL
ALT: 5 U/L (ref 0–35)
AST: 15 U/L (ref 0–37)
Albumin: 3.8 g/dL (ref 3.5–5.2)
Alkaline Phosphatase: 82 U/L (ref 39–117)
BUN: 13 mg/dL (ref 6–23)
CO2: 29 mEq/L (ref 19–32)
Calcium: 9.6 mg/dL (ref 8.4–10.5)
Chloride: 101 mEq/L (ref 96–112)
Creatinine, Ser: 0.68 mg/dL (ref 0.40–1.20)
GFR: 81.81 mL/min (ref >60.00)
Glucose, Bld: 85 mg/dL (ref 70–99)
Potassium: 3.6 mEq/L (ref 3.5–5.1)
Sodium: 141 mEq/L (ref 135–145)
Total Bilirubin: 0.7 mg/dL (ref 0.2–1.2)
Total Protein: 7 g/dL (ref 6.0–8.3)

## 2023-03-29 LAB — CBC WITH DIFFERENTIAL/PLATELET
Basophils Absolute: 0.1 10*3/uL (ref 0.0–0.1)
Basophils Relative: 1.1 % (ref 0.0–3.0)
Eosinophils Absolute: 0.2 10*3/uL (ref 0.0–0.7)
Eosinophils Relative: 2.4 % (ref 0.0–5.0)
HCT: 40.4 % (ref 36.0–46.0)
Hemoglobin: 13.3 g/dL (ref 12.0–15.0)
Lymphocytes Relative: 30.6 % (ref 12.0–46.0)
Lymphs Abs: 2.2 10*3/uL (ref 0.7–4.0)
MCHC: 33 g/dL (ref 30.0–36.0)
MCV: 94.5 fl (ref 78.0–100.0)
Monocytes Absolute: 0.7 10*3/uL (ref 0.1–1.0)
Monocytes Relative: 10.5 % (ref 3.0–12.0)
Neutro Abs: 3.9 10*3/uL (ref 1.4–7.7)
Neutrophils Relative %: 55.4 % (ref 43.0–77.0)
Platelets: 296 10*3/uL (ref 150.0–400.0)
RBC: 4.27 Mil/uL (ref 3.87–5.11)
RDW: 13.1 % (ref 11.5–15.5)
WBC: 7.1 10*3/uL (ref 4.0–10.5)

## 2023-03-29 LAB — SEDIMENTATION RATE: Sed Rate: 41 mm/h — ABNORMAL HIGH (ref 0–30)

## 2023-03-29 LAB — C-REACTIVE PROTEIN: CRP: 5.6 mg/dL (ref 0.5–20.0)

## 2023-03-29 LAB — VITAMIN B12: Vitamin B-12: 303 pg/mL (ref 211–911)

## 2023-03-29 LAB — CK: Total CK: 28 U/L (ref 7–177)

## 2023-03-29 LAB — TSH: TSH: 1.74 u[IU]/mL (ref 0.35–5.50)

## 2023-04-01 ENCOUNTER — Telehealth (INDEPENDENT_AMBULATORY_CARE_PROVIDER_SITE_OTHER): Payer: Medicare PPO | Admitting: Family Medicine

## 2023-04-01 ENCOUNTER — Encounter: Payer: Self-pay | Admitting: Family Medicine

## 2023-04-01 DIAGNOSIS — G4489 Other headache syndrome: Secondary | ICD-10-CM

## 2023-04-01 DIAGNOSIS — R700 Elevated erythrocyte sedimentation rate: Secondary | ICD-10-CM

## 2023-04-01 DIAGNOSIS — R52 Pain, unspecified: Secondary | ICD-10-CM | POA: Diagnosis not present

## 2023-04-01 MED ORDER — DOXYCYCLINE HYCLATE 100 MG PO CAPS: 100.0000 mg | ORAL_CAPSULE | Freq: Two times a day (BID) | ORAL | 0 refills | Status: AC

## 2023-04-01 MED ORDER — PREDNISONE 20 MG PO TABS: ORAL_TABLET | ORAL | 0 refills | Status: DC

## 2023-04-01 NOTE — Progress Notes (Signed)
Virtual Visit via Video Note  I connected with Karen Delacruz  on 04/01/23 at 11:20 AM EDT by a video enabled telemedicine application and verified that I am speaking with the correct person using two identifiers.  Location patient: [place] Location provider:work or home office Persons participating in the virtual visit: patient, provider  I discussed the limitations and requested verbal permission for telemedicine visit. The patient expressed understanding and agreed to proceed.  CC:  82 year old female being seen today for 4-day follow-up acute generalized body pain and severe fatigue. A/P as of last visit: "#1 acute generalized body pain and severe fatigue. She does have a history of fibromyalgia. Question recent viral syndrome triggering this (??). Rapid COVID test here in the office today was NEG. Checking CBC with differential, c-Met, sed rate, CRP, CPK. Rest, hydrate, Tylenol.   2.  Vitamin B12 deficiency: She has not had an injection in 4 months but we will hold off today given the current clinical situation. Check vitamin B12 level today."  INTERIM HX: No significant change. Her pain does seem to be more focused in the shoulders, arms, and some in the occiput area as well as proximal legs.  Describes being very stiff all over.  Very fatigued.  She does not recall any tick bites. No headache in the temples.  No visual abnormalities. No rash.  No fever. Tylenol and ibuprofen no help.  Aleve of minimal help.  Not eating very much but she has been drinking a lot of fluids.  Not taking blood pressure medicines. Systolic blood pressure low 100s this morning.  ROS as above, plus-->  no CP, no SOB, no wheezing, no cough, no dizziness,  no melena/hematochezia.  No polyuria or polydipsia.  No joint swelling.  No focal weakness, paresthesias, or tremors.  No acute vision or hearing abnormalities.  No dysuria or unusual/new urinary urgency or frequency.  No recent changes in lower legs. No n/v/d or  abd pain.  No palpitations.     Past Medical History:  Diagnosis Date   Abnormal EKG 05/2016   LAFB, voltage criteria for LVH in lead aVL.   Anxiety    Cataract    right   Chronic left shoulder pain    RC impingement + osteoarthritis. as of 09/2020 ortho eval, surg recommended but pt holding off, got steroid inj   Colon cancer screening 06/2017   Cologuard positive--colonoscopy showed adenomatous polyp with high grade dysplasia--recall 3 yrs.   COVID-19 virus infection 11/17/2020   Mild URI   Cystocele, unspecified (CODE) 2018   Pessary fitted at Dr. Forestine Chute:  Milex ring with support #3--great improvement.   Degenerative arthritis of cervical spine    Fatty liver    Fibromyalgia    GERD (gastroesophageal reflux disease)    History of hiatal hernia    Hx: UTI (urinary tract infection)    recurrent.  Pyelo hospitalization 2012   Hypertension    Idiopathic urticaria 02/2018   Microhematuria 02/2012   Cystoscopy with bladder biopsy and cystouretograms--pathology benign.   Myalgia    Hx of diffuse myalgias, onset in her 60s   Osteoarthritis of left shoulder    Recurrent L shoulder pain   Osteoporosis 08/10/2016   T-score -2.5: started fosamax 07/2016.  Intol fosamax; started prolia 08/27/2016, most recent prolia was 08/30/2018. DEXA 11/2018 T score -2.3 (improved). 04/2021 DEXA T score -2.4.   Periesophageal hiatal hernia 02/2016   Type 4   Peripheral neuropathy    NCS/EMGs 06/2021 chronic sensorimotor axonal polyneuropathy.  Gabapentin no help   Thickened endometrium 10/16/2011   on CT.  F/u pelvic u/s showed nabothian cyst to account for this..  No enometrial pathology noted on u/s, no ovaries visualized.   Vertigo    Vitamin B12 deficiency 11/2015   + LL PN   Vitamin D deficiency     Past Surgical History:  Procedure Laterality Date   BACK SURGERY  1990s x 2   Dr. Jeannine Kitten in [place]   CHOLECYSTECTOMY     COLONOSCOPY  approx 2002; 08/11/17   2002; Diverticulosis, int  hemorrhoids ([facility]).  2018 (for + cologuard)--adenomatous polyps with high grade dysplasia (recall 3 yrs), also diverticulosis.   COLONOSCOPY N/A 08/11/2017   Procedure: COLONOSCOPY;  Surgeon: Malissa Hippo, MD;  [facility]: [facility];  Service: Endoscopy;  Laterality: N/A;  7:30   DEXA  12/06/2018   T score -2.3 (improved)->continue prolia q25mo.  04/2021 T score -2.4.   ESOPHAGEAL MANOMETRY N/A 04/19/2016   Normal.  Procedure: ESOPHAGEAL MANOMETRY (EM);  Surgeon: Napoleon Form, MD;  Location: [facility];  Service: Endoscopy;  Laterality: N/A;   EYE SURGERY Right    cataract extraction with IOL   HYSTEROSCOPY  11/2004   Cervical hystogram    INSERTION OF MESH N/A 06/18/2016   Procedure: INSERTION OF MESH;  Surgeon: Karie Soda, MD;  Location: [facility];  Service: General;  Laterality: N/A;   NCS/EMG  07/16/2021   NCS/EMGs 06/2021 chronic sensorimotor axonal polyneuropathy (Dr. Allena Katz)   NISSEN FUNDOPLICATION  06/18/2016   POLYPECTOMY  08/11/2017   Procedure: POLYPECTOMY;  Surgeon: Malissa Hippo, MD;  [facility]: [facility];  Service: Endoscopy;;  colon      Current Outpatient Medications:    amLODipine (NORVASC) 5 MG tablet, Take 1 tablet (5 mg total) by mouth daily. OFFICE VISIT NEEDED FOR FURTHER REFILLS, Disp: 30 tablet, Rfl: 0   Ascorbic Acid (VITAMIN C PO), Take by mouth daily., Disp: , Rfl:    aspirin EC 81 MG tablet, Take 1 tablet (81 mg total) by mouth. Every other day, Disp: , Rfl:    chlorthalidone (HYGROTEN) 15 MG tablet, Take 1 tablet (15 mg total) by mouth daily., Disp: 30 tablet, Rfl: 0   cyanocobalamin (,VITAMIN B-12,) 1000 MCG/ML injection, Inject 1 mL (1,000 mcg total) into the muscle once., Disp: 1 mL, Rfl: 0   doxycycline (VIBRAMYCIN) 100 MG capsule, Take 1 capsule (100 mg total) by mouth 2 (two) times daily for 10 days., Disp: 20 capsule, Rfl: 0   fexofenadine (ALLEGRA) 60 MG tablet, Take 1 tablet (60 mg total) by mouth 2 (two) times daily., Disp:  60 tablet, Rfl: 1   Multiple Vitamins-Minerals (MULTIVITAMIN ADULTS) TABS, Take by mouth daily., Disp: , Rfl:    predniSONE (DELTASONE) 20 MG tablet, 2 tabs p.o. daily for 5 days, Disp: 10 tablet, Rfl: 0   Vitamin D, Ergocalciferol, (DRISDOL) 1.25 MG (50000 UNIT) CAPS capsule, Take 1 capsule (50,000 Units total) by mouth every 7 (seven) days., Disp: 12 capsule, Rfl: 1  EXAM:  VITALS per patient if applicable:     03/28/2023    3:02 PM 03/28/2023    2:45 PM 01/07/2023    3:32 PM  Vitals with BMI  Height  5\' 7"    Weight  138 lbs 6 oz   BMI  21.67   Systolic 126 143 629  Diastolic 78 93 75  Pulse  73      GENERAL: alert, oriented, appears well and in no acute distress  HEENT: atraumatic, conjunttiva clear, no obvious abnormalities on inspection of external nose and ears  NECK: normal movements of the head and neck  LUNGS: on inspection no signs of respiratory distress, breathing rate appears normal, no obvious gross SOB, gasping or wheezing  CV: no obvious cyanosis  MS: moves all visible extremities without noticeable abnormality  PSYCH/NEURO: pleasant and cooperative, no obvious depression or anxiety, speech and thought processing grossly intact  LABS: none today Lab Results  Component Value Date   TSH 1.74 03/28/2023   Lab Results  Component Value Date   WBC 7.1 03/28/2023   HGB 13.3 03/28/2023   HCT 40.4 03/28/2023   MCV 94.5 03/28/2023   PLT 296.0 03/28/2023   Lab Results  Component Value Date   CREATININE 0.68 03/28/2023   BUN 13 03/28/2023   NA 141 03/28/2023   K 3.6 03/28/2023   CL 101 03/28/2023   CO2 29 03/28/2023   Lab Results  Component Value Date   ALT 5 03/28/2023   AST 15 03/28/2023   ALKPHOS 82 03/28/2023   BILITOT 0.7 03/28/2023   Lab Results  Component Value Date   CHOL 183 11/09/2018   Lab Results  Component Value Date   HDL 58.70 11/09/2018   Lab Results  Component Value Date   LDLCALC 100 (H) 11/09/2018   Lab Results   Component Value Date   TRIG 125.0 11/09/2018   Lab Results  Component Value Date   CHOLHDL 3 11/09/2018   Lab Results  Component Value Date   HGBA1C 5.7 12/19/2015   Lab Results  Component Value Date   ESRSEDRATE 41 (H) 03/28/2023   Lab Results  Component Value Date   CRP 5.6 03/28/2023   Lab Results  Component Value Date   VITAMINB12 303 03/28/2023   Lab Results  Component Value Date   CKTOTAL 28 03/28/2023   ASSESSMENT AND PLAN:  Discussed the following assessment and plan:  Acute generalized body pain and severe fatigue. She does have a history of fibromyalgia. She had no prodromal illness at all. Recent lab evaluation unrevealing except sed rate mildly elevated at 41, although this would be a normal sed ratefor an 82 year old female. I think is reasonable to treat empirically for tickborne illness with doxycycline 100 mg twice daily. Additionally, given the elevated sed rate with her symptoms have to consider polymyalgia rheumatica, although her pain and stiffness are more severe than I would expect from this. Will start prednisone 40 mg a day x 5 days and see how she responds.  -we discussed possible serious and likely etiologies, options for evaluation and workup,    F/u: 4 to 5 days, virtual okay  Signed:  Santiago Bumpers, MD           04/01/2023

## 2023-04-08 ENCOUNTER — Other Ambulatory Visit: Payer: Self-pay | Admitting: Family Medicine

## 2023-04-08 MED ORDER — PREDNISONE 20 MG PO TABS: ORAL_TABLET | ORAL | 0 refills | Status: DC

## 2023-04-12 ENCOUNTER — Telehealth: Payer: Medicare PPO | Admitting: Family Medicine

## 2023-04-12 ENCOUNTER — Encounter: Payer: Self-pay | Admitting: Family Medicine

## 2023-04-12 DIAGNOSIS — R700 Elevated erythrocyte sedimentation rate: Secondary | ICD-10-CM

## 2023-04-12 DIAGNOSIS — M353 Polymyalgia rheumatica: Secondary | ICD-10-CM

## 2023-04-12 DIAGNOSIS — E559 Vitamin D deficiency, unspecified: Secondary | ICD-10-CM

## 2023-04-12 DIAGNOSIS — Z7952 Long term (current) use of systemic steroids: Secondary | ICD-10-CM

## 2023-04-12 MED ORDER — PREDNISONE 20 MG PO TABS: ORAL_TABLET | ORAL | 0 refills | Status: DC

## 2023-04-12 NOTE — Progress Notes (Signed)
Virtual Visit via Video Note  I connected with Karen Delacruz  on 04/12/23 at 11:00 AM EDT by a video enabled telemedicine application and verified that I am speaking with the correct person using two identifiers.  Location patient: [place] Location provider:work or home office Persons participating in the virtual visit: patient, provider  I discussed the limitations and requested verbal permission for telemedicine visit. The patient expressed understanding and agreed to proceed.  CC: 82 year old female being seen today for 11-day follow-up fatigue and bodyaches. A/P as of last visit: "Acute generalized body pain and severe fatigue. She does have a history of fibromyalgia. She had no prodromal illness at all. Recent lab evaluation unrevealing except sed rate mildly elevated at 41, although this would be a normal sed ratefor an 82 year old female. I think is reasonable to treat empirically for tickborne illness with doxycycline 100 mg twice daily. Additionally, given the elevated sed rate with her symptoms have to consider polymyalgia rheumatica, although her pain and stiffness are more severe than I would expect from this. Will start prednisone 40 mg a day x 5 days and see how she responds."  INTERIM HX: [name] had an excellent response to prednisone 20 mg a day.  She has no pain at all now and her energy is much improved.  She does some floor cycling throughout her day to try to get her tone and energy back. Side effects from prednisone--much increased appetite. Otherwise none. She denies headache, visual abnormalities, or rash.   ROS: See pertinent positives and negatives per HPI.  Past Medical History:  Diagnosis Date   Abnormal EKG 05/2016   LAFB, voltage criteria for LVH in lead aVL.   Anxiety    Cataract    right   Chronic left shoulder pain    RC impingement + osteoarthritis. as of 09/2020 ortho eval, surg recommended but pt holding off, got steroid inj   Colon cancer screening 06/2017    Cologuard positive--colonoscopy showed adenomatous polyp with high grade dysplasia--recall 3 yrs.   COVID-19 virus infection 11/17/2020   Mild URI   Cystocele, unspecified (CODE) 2018   Pessary fitted at Dr. Forestine Chute:  Milex ring with support #3--great improvement.   Degenerative arthritis of cervical spine    Fatty liver    Fibromyalgia    GERD (gastroesophageal reflux disease)    History of hiatal hernia    Hx: UTI (urinary tract infection)    recurrent.  Pyelo hospitalization 2012   Hypertension    Idiopathic urticaria 02/2018   Microhematuria 02/2012   Cystoscopy with bladder biopsy and cystouretograms--pathology benign.   Myalgia    Hx of diffuse myalgias, onset in her 60s   Osteoarthritis of left shoulder    Recurrent L shoulder pain   Osteoporosis 08/10/2016   T-score -2.5: started fosamax 07/2016.  Intol fosamax; started prolia 08/27/2016, most recent prolia was 08/30/2018. DEXA 11/2018 T score -2.3 (improved). 04/2021 DEXA T score -2.4.   Periesophageal hiatal hernia 02/2016   Type 4   Peripheral neuropathy    NCS/EMGs 06/2021 chronic sensorimotor axonal polyneuropathy. Gabapentin no help   Thickened endometrium 10/16/2011   on CT.  F/u pelvic u/s showed nabothian cyst to account for this..  No enometrial pathology noted on u/s, no ovaries visualized.   Vertigo    Vitamin B12 deficiency 11/2015   + LL PN   Vitamin D deficiency     Past Surgical History:  Procedure Laterality Date   BACK SURGERY  1990s x 2   Dr.  Haskins in [place]   CHOLECYSTECTOMY     COLONOSCOPY  approx 2002; 08/11/17   2002; Diverticulosis, int hemorrhoids ([facility]).  2018 (for + cologuard)--adenomatous polyps with high grade dysplasia (recall 3 yrs), also diverticulosis.   COLONOSCOPY N/A 08/11/2017   Procedure: COLONOSCOPY;  Surgeon: Malissa Hippo, MD;  [facility]: [facility];  Service: Endoscopy;  Laterality: N/A;  7:30   DEXA  12/06/2018   T score -2.3 (improved)->continue prolia q96mo.   04/2021 T score -2.4.   ESOPHAGEAL MANOMETRY N/A 04/19/2016   Normal.  Procedure: ESOPHAGEAL MANOMETRY (EM);  Surgeon: Napoleon Form, MD;  Location: [facility];  Service: Endoscopy;  Laterality: N/A;   EYE SURGERY Right    cataract extraction with IOL   HYSTEROSCOPY  11/2004   Cervical hystogram    INSERTION OF MESH N/A 06/18/2016   Procedure: INSERTION OF MESH;  Surgeon: Karie Soda, MD;  Location: [facility];  Service: General;  Laterality: N/A;   NCS/EMG  07/16/2021   NCS/EMGs 06/2021 chronic sensorimotor axonal polyneuropathy (Dr. Allena Katz)   NISSEN FUNDOPLICATION  06/18/2016   POLYPECTOMY  08/11/2017   Procedure: POLYPECTOMY;  Surgeon: Malissa Hippo, MD;  [facility]: [facility];  Service: Endoscopy;;  colon      Current Outpatient Medications:    amLODipine (NORVASC) 5 MG tablet, Take 1 tablet (5 mg total) by mouth daily. OFFICE VISIT NEEDED FOR FURTHER REFILLS, Disp: 30 tablet, Rfl: 0   Ascorbic Acid (VITAMIN C PO), Take by mouth daily., Disp: , Rfl:    aspirin EC 81 MG tablet, Take 1 tablet (81 mg total) by mouth. Every other day, Disp: , Rfl:    chlorthalidone (HYGROTEN) 15 MG tablet, Take 1 tablet (15 mg total) by mouth daily., Disp: 30 tablet, Rfl: 0   cyanocobalamin (,VITAMIN B-12,) 1000 MCG/ML injection, Inject 1 mL (1,000 mcg total) into the muscle once. (Patient not taking: Reported on 04/12/2023), Disp: 1 mL, Rfl: 0   fexofenadine (ALLEGRA) 60 MG tablet, Take 1 tablet (60 mg total) by mouth 2 (two) times daily., Disp: 60 tablet, Rfl: 1   Multiple Vitamins-Minerals (MULTIVITAMIN ADULTS) TABS, Take by mouth daily., Disp: , Rfl:    predniSONE (DELTASONE) 20 MG tablet, 2 tabs p.o. daily for 3 days, Disp: 6 tablet, Rfl: 0   Vitamin D, Ergocalciferol, (DRISDOL) 1.25 MG (50000 UNIT) CAPS capsule, Take 1 capsule (50,000 Units total) by mouth every 7 (seven) days., Disp: 12 capsule, Rfl: 1  EXAM:  VITALS per patient if applicable:     03/28/2023    3:02 PM 03/28/2023    2:45 PM  01/07/2023    3:32 PM  Vitals with BMI  Height  5\' 7"    Weight  138 lbs 6 oz   BMI  21.67   Systolic 126 143 161  Diastolic 78 93 75  Pulse  73      GENERAL: alert, oriented, appears well and in no acute distress  HEENT: atraumatic, conjunttiva clear, no obvious abnormalities on inspection of external nose and ears  NECK: normal movements of the head and neck  LUNGS: on inspection no signs of respiratory distress, breathing rate appears normal, no obvious gross SOB, gasping or wheezing  CV: no obvious cyanosis  MS: moves all visible extremities without noticeable abnormality  PSYCH/NEURO: pleasant and cooperative, no obvious depression or anxiety, speech and thought processing grossly intact  LABS: none today    Chemistry      Component Value Date/Time   NA 141 03/28/2023 1533  K 3.6 03/28/2023 1533   CL 101 03/28/2023 1533   CO2 29 03/28/2023 1533   BUN 13 03/28/2023 1533   CREATININE 0.68 03/28/2023 1533      Component Value Date/Time   CALCIUM 9.6 03/28/2023 1533   ALKPHOS 82 03/28/2023 1533   AST 15 03/28/2023 1533   ALT 5 03/28/2023 1533   BILITOT 0.7 03/28/2023 1533     Lab Results  Component Value Date   WBC 7.1 03/28/2023   HGB 13.3 03/28/2023   HCT 40.4 03/28/2023   MCV 94.5 03/28/2023   PLT 296.0 03/28/2023   Lab Results  Component Value Date   ESRSEDRATE 41 (H) 03/28/2023   Lab Results  Component Value Date   TSH 1.74 03/28/2023   Lab Results  Component Value Date   CKTOTAL 28 03/28/2023   Lab Results  Component Value Date   CRP 5.6 03/28/2023   Lab Results  Component Value Date   VITAMINB12 303 03/28/2023   Last vitamin D Lab Results  Component Value Date   VD25OH 27.98 (L) 08/19/2022   Lab Results  Component Value Date   HGBA1C 5.7 12/19/2015   ASSESSMENT AND PLAN:  Discussed the following assessment and plan:  #1 polymyalgia rheumatica suspected --> based on her clinical presentation, mild elevation of sed rate, and  her excellent response to prednisone. Refer to rheumatology today. Continue prednisone 20 mg a day and we will see if we can wean this some at follow-up in 2-3 weeks.  She has transportation problems and has not been able to come for her most recent Prolia injection.  When she does arrange this we will get blood draw to repeat sed rate, vitamin D level, and check a baseline hemoglobin A1c.  I discussed the assessment and treatment plan with the patient. The patient was provided an opportunity to ask questions and all were answered. The patient agreed with the plan and demonstrated an understanding of the instructions.   F/u: 2 to 3 weeks  Signed:  Santiago Bumpers, MD           04/12/2023

## 2023-04-15 ENCOUNTER — Other Ambulatory Visit: Payer: Self-pay

## 2023-04-18 ENCOUNTER — Ambulatory Visit: Payer: Medicare PPO | Admitting: Family Medicine

## 2023-04-18 ENCOUNTER — Telehealth: Payer: Self-pay

## 2023-04-18 ENCOUNTER — Encounter: Payer: Self-pay | Admitting: Family Medicine

## 2023-04-18 VITALS — BP 152/90 | HR 62 | Temp 98.0°F | Ht 67.0 in | Wt 142.2 lb

## 2023-04-18 DIAGNOSIS — I10 Essential (primary) hypertension: Secondary | ICD-10-CM

## 2023-04-18 DIAGNOSIS — R700 Elevated erythrocyte sedimentation rate: Secondary | ICD-10-CM | POA: Diagnosis not present

## 2023-04-18 DIAGNOSIS — E559 Vitamin D deficiency, unspecified: Secondary | ICD-10-CM

## 2023-04-18 DIAGNOSIS — Z7952 Long term (current) use of systemic steroids: Secondary | ICD-10-CM | POA: Diagnosis not present

## 2023-04-18 DIAGNOSIS — M353 Polymyalgia rheumatica: Secondary | ICD-10-CM | POA: Diagnosis not present

## 2023-04-18 LAB — SEDIMENTATION RATE: Sed Rate: 15 mm/hr (ref 0–30)

## 2023-04-18 LAB — HEMOGLOBIN A1C: Hgb A1c MFr Bld: 5.6 % (ref 4.6–6.5)

## 2023-04-18 LAB — VITAMIN D 25 HYDROXY (VIT D DEFICIENCY, FRACTURES): VITD: 35.19 ng/mL (ref 30.00–100.00)

## 2023-04-18 MED ORDER — PREDNISONE 5 MG PO TABS: ORAL_TABLET | ORAL | 0 refills | Status: DC

## 2023-04-18 MED ORDER — AMLODIPINE BESYLATE 5 MG PO TABS: 5.0000 mg | ORAL_TABLET | Freq: Every day | ORAL | 1 refills | Status: DC

## 2023-04-18 NOTE — Patient Instructions (Signed)
Restart amlodipine 5mg  daily for high blood pressure treatment.  I sent rx to pharmacy today.

## 2023-04-18 NOTE — Progress Notes (Signed)
OFFICE VISIT  04/18/2023  CC:  Chief Complaint  Patient presents with   Polymylagia    Patient is a 82 y.o. female who presents for 1 week follow-up suspected polymyalgia rheumatica. A/P as of last visit: "#1 polymyalgia rheumatica suspected --> based on her clinical presentation, mild elevation of sed rate, and her excellent response to prednisone. Refer to rheumatology today. Continue prednisone 20 mg a day and we will see if we can wean this some at follow-up in 2-3 weeks.   She has transportation problems and has not been able to come for her most recent Prolia injection.  When she does arrange this we will get blood draw to repeat sed rate, vitamin D level, and check a baseline hemoglobin A1c."  INTERIM HX: Was doing well on 20 mg/day dose of prednisone until 2 days ago when she had an acute flare of her pain.  The pain starts in both hands and then extends up both arms to the shoulders region.  No swelling or erythema of the elbows, wrists, or fingers. 2 days ago when her pain came back she increased her prednisone to 2 of the 20 mg tabs a day.  This has helped very well. She has not taken any yet today.  She has not been taking amlodipine for her hypertension.  She does not monitor blood pressure at home.  ROS --> no fevers, no CP, no SOB, no wheezing, no cough, no dizziness, no HAs, no rashes, no melena/hematochezia.  No polyuria or polydipsia.  No myalgias or arthralgias.  No focal weakness, paresthesias, or tremors.  No acute vision or hearing abnormalities.  No dysuria or unusual/new urinary urgency or frequency.  No recent changes in lower legs. No n/v/d or abd pain.  No palpitations.      Past Medical History:  Diagnosis Date   Abnormal EKG 05/2016   LAFB, voltage criteria for LVH in lead aVL.   Anxiety    Cataract    right   Chronic left shoulder pain    RC impingement + osteoarthritis. as of 09/2020 ortho eval, surg recommended but pt holding off, got steroid inj    Colon cancer screening 06/2017   Cologuard positive--colonoscopy showed adenomatous polyp with high grade dysplasia--recall 3 yrs.   COVID-19 virus infection 11/17/2020   Mild URI   Cystocele, unspecified (CODE) 2018   Pessary fitted at Dr. Forestine Chute:  Milex ring with support #3--great improvement.   Degenerative arthritis of cervical spine    Fatty liver    Fibromyalgia    GERD (gastroesophageal reflux disease)    History of hiatal hernia    Hx: UTI (urinary tract infection)    recurrent.  Pyelo hospitalization 2012   Hypertension    Idiopathic urticaria 02/2018   Microhematuria 02/2012   Cystoscopy with bladder biopsy and cystouretograms--pathology benign.   Myalgia    Hx of diffuse myalgias, onset in her 60s   Osteoarthritis of left shoulder    Recurrent L shoulder pain   Osteoporosis 08/10/2016   T-score -2.5: started fosamax 07/2016.  Intol fosamax; started prolia 08/27/2016, most recent prolia was 08/30/2018. DEXA 11/2018 T score -2.3 (improved). 04/2021 DEXA T score -2.4.   Periesophageal hiatal hernia 02/2016   Type 4   Peripheral neuropathy    NCS/EMGs 06/2021 chronic sensorimotor axonal polyneuropathy. Gabapentin no help   Thickened endometrium 10/16/2011   on CT.  F/u pelvic u/s showed nabothian cyst to account for this..  No enometrial pathology noted on u/s, no ovaries  visualized.   Vertigo    Vitamin B12 deficiency 11/2015   + LL PN   Vitamin D deficiency     Past Surgical History:  Procedure Laterality Date   BACK SURGERY  1990s x 2   Dr. Jeannine Kitten in [place]   CHOLECYSTECTOMY     COLONOSCOPY  approx 2002; 08/11/17   2002; Diverticulosis, int hemorrhoids ([facility]).  2018 (for + cologuard)--adenomatous polyps with high grade dysplasia (recall 3 yrs), also diverticulosis.   COLONOSCOPY N/A 08/11/2017   Procedure: COLONOSCOPY;  Surgeon: Malissa Hippo, MD;  [facility];  Service: Endoscopy;  Laterality: N/A;  7:30   DEXA  12/06/2018   T score -2.3  (improved)->continue prolia q74mo.  04/2021 T score -2.4.   ESOPHAGEAL MANOMETRY N/A 04/19/2016   Normal.  Procedure: ESOPHAGEAL MANOMETRY (EM);  Surgeon: Napoleon Form, MD;  Location: [facility];  Service: Endoscopy;  Laterality: N/A;   EYE SURGERY Right    cataract extraction with IOL   HYSTEROSCOPY  11/2004   Cervical hystogram    INSERTION OF MESH N/A 06/18/2016   Procedure: INSERTION OF MESH;  Surgeon: Karie Soda, MD;  Location: [facility];  Service: General;  Laterality: N/A;   NCS/EMG  07/16/2021   NCS/EMGs 06/2021 chronic sensorimotor axonal polyneuropathy (Dr. Allena Katz)   NISSEN FUNDOPLICATION  06/18/2016   POLYPECTOMY  08/11/2017   Procedure: POLYPECTOMY;  Surgeon: Malissa Hippo, MD;  [facility];  Service: Endoscopy;;  colon     Outpatient Medications Prior to Visit  Medication Sig Dispense Refill   Ascorbic Acid (VITAMIN C PO) Take by mouth daily.     cyanocobalamin (,VITAMIN B-12,) 1000 MCG/ML injection Inject 1 mL (1,000 mcg total) into the muscle once. (Patient not taking: Reported on 04/12/2023) 1 mL 0   Multiple Vitamins-Minerals (MULTIVITAMIN ADULTS) TABS Take by mouth daily.     Vitamin D, Ergocalciferol, (DRISDOL) 1.25 MG (50000 UNIT) CAPS capsule Take 1 capsule (50,000 Units total) by mouth every 7 (seven) days. 12 capsule 1   amLODipine (NORVASC) 5 MG tablet Take 1 tablet (5 mg total) by mouth daily. OFFICE VISIT NEEDED FOR FURTHER REFILLS 30 tablet 0   predniSONE (DELTASONE) 20 MG tablet 1 tab po qd 30 tablet 0   No facility-administered medications prior to visit.    Allergies  Allergen Reactions   Ciprofloxacin Nausea And Vomiting   Codeine Nausea And Vomiting and Other (See Comments)    Feels funny   Celebrex [Celecoxib] Rash   Penicillins Rash    Has patient had a PCN reaction causing immediate rash, facial/tongue/throat swelling, SOB or lightheadedness with hypotension: Rash Has patient had a PCN reaction causing severe rash involving  mucus membranes or skin necrosis: No Has patient had a PCN reaction that required hospitalization No Has patient had a PCN reaction occurring within the last 10 years: NO If all of the above answers are "NO", then may proceed with Cephalosporin use.    Review of Systems As per HPI  PE:    04/18/2023   11:42 AM 04/18/2023   10:48 AM 03/28/2023    3:02 PM  Vitals with BMI  Height  5\' 7"    Weight  142 lbs 3 oz   BMI  22.27   Systolic 152 175 161  Diastolic 90 90 78  Pulse  62      Physical Exam  Gen: Alert, well appearing.  Patient is oriented to person, place, time, and situation. AFFECT: pleasant, lucid thought and  speech. Hands: No erythema or swelling.  No significant bony hypertrophy of the joints. Range of motion intact.  No tenderness.  LABS:  Last CBC Lab Results  Component Value Date   WBC 7.1 03/28/2023   HGB 13.3 03/28/2023   HCT 40.4 03/28/2023   MCV 94.5 03/28/2023   MCH 32.2 06/19/2016   RDW 13.1 03/28/2023   PLT 296.0 03/28/2023   Lab Results  Component Value Date   ESRSEDRATE 41 (H) 03/28/2023   Last metabolic panel Lab Results  Component Value Date   GLUCOSE 85 03/28/2023   NA 141 03/28/2023   K 3.6 03/28/2023   CL 101 03/28/2023   CO2 29 03/28/2023   BUN 13 03/28/2023   CREATININE 0.68 03/28/2023   GFRNONAA >60 06/19/2016   CALCIUM 9.6 03/28/2023   PROT 7.0 03/28/2023   ALBUMIN 3.8 03/28/2023   BILITOT 0.7 03/28/2023   ALKPHOS 82 03/28/2023   AST 15 03/28/2023   ALT 5 03/28/2023   ANIONGAP 6 06/19/2016   Last hemoglobin A1c Lab Results  Component Value Date   HGBA1C 5.7 12/19/2015   Last thyroid functions Lab Results  Component Value Date   TSH 1.74 03/28/2023   T3TOTAL 103.0 03/15/2017   Last vitamin D Lab Results  Component Value Date   VD25OH 27.98 (L) 08/19/2022   Last vitamin B12 and Folate Lab Results  Component Value Date   VITAMINB12 303 03/28/2023   FOLATE >24.4 05/22/2021   IMPRESSION AND PLAN:  #1  polymyalgia rheumatica, recent diagnosis.   History of fibromyalgia. Discussed the need to minimize prednisone is much as possible.  Continue 20 mg a day through this week and then drop down to 15 mg a day until I see her again in 2 weeks. She has initial rheumatology appointment in 6 months, will try to move that up. Repeat sed rate today as well as check vitamin D and hemoglobin A1c.  #2 hypertension, uncontrolled. Needs to restart amlodipine 5 mg a day, prescription sent today.  An After Visit Summary was printed and given to the patient.  FOLLOW UP: Return in about 2 weeks (around 05/02/2023) for f/u PMR.  Signed:  Santiago Bumpers, MD           04/18/2023

## 2023-04-18 NOTE — Telephone Encounter (Signed)
Karen Delacruz,  We are needing assistance regarding a rheumatology referral placed on 6/18. The patient has an appt 11/1 with [facility] but we are wanting to find another office that can see her sooner.  Please let me know if you will be able to further assist.  Moshe Cipro CMA

## 2023-04-19 ENCOUNTER — Other Ambulatory Visit: Payer: Self-pay | Admitting: Family Medicine

## 2023-04-19 MED ORDER — VITAMIN D (ERGOCALCIFEROL) 1.25 MG (50000 UNIT) PO CAPS: 50000.0000 [IU] | ORAL_CAPSULE | ORAL | 3 refills | Status: DC

## 2023-04-19 NOTE — Telephone Encounter (Signed)
Refrral moved to [place] [facility] per requst letter sent to [employer]

## 2023-04-19 NOTE — Telephone Encounter (Signed)
Patients daughter called back and stated that going to [facility] is too far, but wanted to suggest [facility] if that may be an option.

## 2023-04-20 NOTE — Telephone Encounter (Signed)
noted 

## 2023-04-26 DIAGNOSIS — Z6822 Body mass index (BMI) 22.0-22.9, adult: Secondary | ICD-10-CM | POA: Diagnosis not present

## 2023-04-26 DIAGNOSIS — M25511 Pain in right shoulder: Secondary | ICD-10-CM | POA: Diagnosis not present

## 2023-04-26 DIAGNOSIS — M79642 Pain in left hand: Secondary | ICD-10-CM | POA: Diagnosis not present

## 2023-04-26 DIAGNOSIS — M79641 Pain in right hand: Secondary | ICD-10-CM | POA: Diagnosis not present

## 2023-04-26 DIAGNOSIS — M25512 Pain in left shoulder: Secondary | ICD-10-CM | POA: Diagnosis not present

## 2023-04-27 ENCOUNTER — Telehealth: Payer: Self-pay | Admitting: Family Medicine

## 2023-04-27 NOTE — Telephone Encounter (Signed)
Patient is scheduled for prolia injection 7/5.

## 2023-04-27 NOTE — Telephone Encounter (Signed)
Noted  

## 2023-04-29 ENCOUNTER — Ambulatory Visit (INDEPENDENT_AMBULATORY_CARE_PROVIDER_SITE_OTHER): Payer: Medicare PPO

## 2023-04-29 DIAGNOSIS — E559 Vitamin D deficiency, unspecified: Secondary | ICD-10-CM | POA: Diagnosis not present

## 2023-04-29 MED ORDER — DENOSUMAB 60 MG/ML ~~LOC~~ SOSY
60.00 mg | PREFILLED_SYRINGE | Freq: Once | SUBCUTANEOUS | Status: AC
Start: 2023-04-29 — End: 2023-04-29
  Administered 2023-04-29: 60 mg via SUBCUTANEOUS

## 2023-04-29 NOTE — Progress Notes (Signed)
Pt in for Prolia injection per Dr. Milinda Cave.  Injection tolerated well.  Pt scheduled for next injection in 6 months.

## 2023-05-02 ENCOUNTER — Telehealth: Payer: Self-pay

## 2023-05-02 NOTE — Telephone Encounter (Signed)
Patient Advocate Encounter  Prior Authorization for [employer] has been approved with [employer].    PA# [id number] Effective dates: 03/18/20 through 10/25/23

## 2023-05-03 ENCOUNTER — Ambulatory Visit: Payer: Medicare PPO | Admitting: Family Medicine

## 2023-05-10 ENCOUNTER — Encounter: Payer: Self-pay | Admitting: Family Medicine

## 2023-05-24 DIAGNOSIS — M25511 Pain in right shoulder: Secondary | ICD-10-CM | POA: Diagnosis not present

## 2023-05-24 DIAGNOSIS — Z6822 Body mass index (BMI) 22.0-22.9, adult: Secondary | ICD-10-CM | POA: Diagnosis not present

## 2023-05-24 DIAGNOSIS — Z7952 Long term (current) use of systemic steroids: Secondary | ICD-10-CM | POA: Diagnosis not present

## 2023-05-24 DIAGNOSIS — M353 Polymyalgia rheumatica: Secondary | ICD-10-CM | POA: Diagnosis not present

## 2023-05-24 DIAGNOSIS — M25512 Pain in left shoulder: Secondary | ICD-10-CM | POA: Diagnosis not present

## 2023-05-24 DIAGNOSIS — M79641 Pain in right hand: Secondary | ICD-10-CM | POA: Diagnosis not present

## 2023-05-24 DIAGNOSIS — M79642 Pain in left hand: Secondary | ICD-10-CM | POA: Diagnosis not present

## 2023-07-19 DIAGNOSIS — Z7952 Long term (current) use of systemic steroids: Secondary | ICD-10-CM | POA: Diagnosis not present

## 2023-07-19 DIAGNOSIS — M353 Polymyalgia rheumatica: Secondary | ICD-10-CM | POA: Diagnosis not present

## 2023-07-19 DIAGNOSIS — Z6823 Body mass index (BMI) 23.0-23.9, adult: Secondary | ICD-10-CM | POA: Diagnosis not present

## 2023-07-29 ENCOUNTER — Ambulatory Visit: Payer: Medicare PPO | Admitting: Family Medicine

## 2023-07-29 ENCOUNTER — Encounter: Payer: Self-pay | Admitting: Family Medicine

## 2023-07-29 VITALS — BP 127/74 | HR 75 | Temp 97.7°F | Ht 67.0 in | Wt 147.6 lb

## 2023-07-29 DIAGNOSIS — M353 Polymyalgia rheumatica: Secondary | ICD-10-CM | POA: Diagnosis not present

## 2023-07-29 DIAGNOSIS — I10 Essential (primary) hypertension: Secondary | ICD-10-CM | POA: Diagnosis not present

## 2023-07-29 DIAGNOSIS — E538 Deficiency of other specified B group vitamins: Secondary | ICD-10-CM

## 2023-07-29 DIAGNOSIS — Z7952 Long term (current) use of systemic steroids: Secondary | ICD-10-CM

## 2023-07-29 MED ORDER — AMLODIPINE BESYLATE 5 MG PO TABS: 5.0000 mg | ORAL_TABLET | Freq: Every day | ORAL | 3 refills | Status: DC

## 2023-07-29 MED ORDER — CYANOCOBALAMIN 1000 MCG/ML IJ SOLN
1000.0000 ug | Freq: Once | INTRAMUSCULAR | Status: AC
Start: 2023-07-29 — End: 2023-07-29
  Administered 2023-07-29: 1000 ug via INTRAMUSCULAR

## 2023-07-29 NOTE — Progress Notes (Signed)
OFFICE VISIT  07/29/2023  CC:  Chief Complaint  Patient presents with   Medical Management of Chronic Issues    Patient is a 82 y.o. female who presents accompanied by her husband Renae Fickle for 26-month follow-up polymyalgia and hypertension.  INTERIM HX: Her muscle and joint pains are significantly improved.  She is followed by rheumatology and they are slowly weaning down her prednisone.  She is currently on 7 mg a day. She notes that her legs feel swollen since getting on prednisone, particularly bothersome at night.   Past Medical History:  Diagnosis Date   Abnormal EKG 05/2016   LAFB, voltage criteria for LVH in lead aVL.   Anxiety    Cataract    right   Chronic left shoulder pain    RC impingement + osteoarthritis. as of 09/2020 ortho eval, surg recommended but pt holding off, got steroid inj   Colon cancer screening 06/2017   Cologuard positive--colonoscopy showed adenomatous polyp with high grade dysplasia--recall 3 yrs.   COVID-19 virus infection 11/17/2020   Mild URI   Cystocele, unspecified (CODE) 2018   Pessary fitted at Dr. Forestine Chute:  Milex ring with support #3--great improvement.   Degenerative arthritis of cervical spine    Fatty liver    Fibromyalgia    GERD (gastroesophageal reflux disease)    History of hiatal hernia    Hx: UTI (urinary tract infection)    recurrent.  Pyelo hospitalization 2012   Hypertension    Idiopathic urticaria 02/2018   Microhematuria 02/2012   Cystoscopy with bladder biopsy and cystouretograms--pathology benign.   Myalgia    Hx of diffuse myalgias, onset in her 60s   Osteoarthritis of left shoulder    Recurrent L shoulder pain   Osteoporosis 08/10/2016   T-score -2.5: started fosamax 07/2016.  Intol fosamax; started prolia 08/27/2016, most recent prolia was 08/30/2018. DEXA 11/2018 T score -2.3 (improved). 04/2021 DEXA T score -2.4.   Periesophageal hiatal hernia 02/2016   Type 4   Peripheral neuropathy    NCS/EMGs 06/2021 chronic  sensorimotor axonal polyneuropathy. Gabapentin no help   Polymyalgia rheumatica (HCC)    2024   Thickened endometrium 10/16/2011   on CT.  F/u pelvic u/s showed nabothian cyst to account for this..  No enometrial pathology noted on u/s, no ovaries visualized.   Vertigo    Vitamin B12 deficiency 11/2015   + LL PN   Vitamin D deficiency     Past Surgical History:  Procedure Laterality Date   BACK SURGERY  1990s x 2   Dr. Jeannine Kitten in [place]   CHOLECYSTECTOMY     COLONOSCOPY  approx 2002; 08/11/17   2002; Diverticulosis, int hemorrhoids ([facility]).  2018 (for + cologuard)--adenomatous polyps with high grade dysplasia (recall 3 yrs), also diverticulosis.   COLONOSCOPY N/A 08/11/2017   Procedure: COLONOSCOPY;  Surgeon: Malissa Hippo, MD;  Location: [facility];  Service: Endoscopy;  Laterality: N/A;  7:30   DEXA  12/06/2018   T score -2.3 (improved)->continue prolia q38mo.  04/2021 T score -2.4.   ESOPHAGEAL MANOMETRY N/A 04/19/2016   Normal.  Procedure: ESOPHAGEAL MANOMETRY (EM);  Surgeon: Napoleon Form, MD;  Location: [facility];  Service: Endoscopy;  Laterality: N/A;   EYE SURGERY Right    cataract extraction with IOL   HYSTEROSCOPY  11/2004   Cervical hystogram    INSERTION OF MESH N/A 06/18/2016   Procedure: INSERTION OF MESH;  Surgeon: Karie Soda, MD;  Location: [facility];  Service: General;  Laterality:  N/A;   NCS/EMG  07/16/2021   NCS/EMGs 06/2021 chronic sensorimotor axonal polyneuropathy (Dr. Allena Katz)   NISSEN FUNDOPLICATION  06/18/2016   POLYPECTOMY  08/11/2017   Procedure: POLYPECTOMY;  Surgeon: Malissa Hippo, MD;  Location: [facility];  Service: Endoscopy;;  colon     Outpatient Medications Prior to Visit  Medication Sig Dispense Refill   Ascorbic Acid (VITAMIN C PO) Take by mouth daily.     Multiple Vitamins-Minerals (MULTIVITAMIN ADULTS) TABS Take by mouth daily.     predniSONE (DELTASONE) 1 MG tablet Take 7 mg by mouth daily with breakfast. Slow ween  per rheumatology     Vitamin D, Ergocalciferol, (DRISDOL) 1.25 MG (50000 UNIT) CAPS capsule Take 1 capsule (50,000 Units total) by mouth every 7 (seven) days. 12 capsule 3   amLODipine (NORVASC) 5 MG tablet Take 1 tablet (5 mg total) by mouth daily. OFFICE VISIT NEEDED FOR FURTHER REFILLS 90 tablet 1   predniSONE (DELTASONE) 5 MG tablet 3 tabs po qd 90 tablet 0   cyanocobalamin (,VITAMIN B-12,) 1000 MCG/ML injection Inject 1 mL (1,000 mcg total) into the muscle once. (Patient not taking: Reported on 04/12/2023) 1 mL 0   No facility-administered medications prior to visit.    Allergies  Allergen Reactions   Ciprofloxacin Nausea And Vomiting   Codeine Nausea And Vomiting and Other (See Comments)    Feels funny   Celebrex [Celecoxib] Rash   Penicillins Rash    Has patient had a PCN reaction causing immediate rash, facial/tongue/throat swelling, SOB or lightheadedness with hypotension: Rash Has patient had a PCN reaction causing severe rash involving mucus membranes or skin necrosis: No Has patient had a PCN reaction that required hospitalization No Has patient had a PCN reaction occurring within the last 10 years: NO If all of the above answers are "NO", then may proceed with Cephalosporin use.    Review of Systems As per HPI  PE:    07/29/2023    1:05 PM 04/18/2023   11:42 AM 04/18/2023   10:48 AM  Vitals with BMI  Height 5\' 7"   5\' 7"   Weight 147 lbs 10 oz  142 lbs 3 oz  BMI 23.11  22.27  Systolic 127 152 098  Diastolic 74 90 90  Pulse 75  62     Physical Exam  Gen: Alert, well appearing.  Patient is oriented to person, place, time, and situation. AFFECT: pleasant, lucid thought and speech. CV: RRR, no m/r/g.   LUNGS: CTA bilat, nonlabored resps, good aeration in all lung fields. EXT: no clubbing or cyanosis.  no edema.    LABS:  Last CBC Lab Results  Component Value Date   WBC 7.1 03/28/2023   HGB 13.3 03/28/2023   HCT 40.4 03/28/2023   MCV 94.5 03/28/2023   MCH  32.2 06/19/2016   RDW 13.1 03/28/2023   PLT 296.0 03/28/2023   Last metabolic panel Lab Results  Component Value Date   GLUCOSE 85 03/28/2023   NA 141 03/28/2023   K 3.6 03/28/2023   CL 101 03/28/2023   CO2 29 03/28/2023   BUN 13 03/28/2023   CREATININE 0.68 03/28/2023   GFR 81.81 03/28/2023   CALCIUM 9.6 03/28/2023   PROT 7.0 03/28/2023   ALBUMIN 3.8 03/28/2023   BILITOT 0.7 03/28/2023   ALKPHOS 82 03/28/2023   AST 15 03/28/2023   ALT 5 03/28/2023   ANIONGAP 6 06/19/2016   Last lipids Lab Results  Component Value Date   CHOL 183 11/09/2018  HDL 58.70 11/09/2018   LDLCALC 100 (H) 11/09/2018   LDLDIRECT 140.1 08/17/2012   TRIG 125.0 11/09/2018   CHOLHDL 3 11/09/2018   Last hemoglobin A1c Lab Results  Component Value Date   HGBA1C 5.6 04/18/2023   Last thyroid functions Lab Results  Component Value Date   TSH 1.74 03/28/2023   T3TOTAL 103.0 03/15/2017   Last vitamin D Lab Results  Component Value Date   VD25OH 35.19 04/18/2023   Last vitamin B12 and Folate Lab Results  Component Value Date   VITAMINB12 303 03/28/2023   FOLATE >24.4 05/22/2021   Lab Results  Component Value Date   ESRSEDRATE 15 04/18/2023   IMPRESSION AND PLAN:  #1 hypertension, well-controlled on amlodipine 5 mg a day.  #2 polymyalgia rheumatica. Slowly weaning prednisone, currently on 7 mg/day per rheumatology. Doing well.  She feels like prednisone has made her legs swell.  No pitting edema on exam today.  Weight is up only 5 pounds in the last 4 months, not too bad. Discussed low-sodium diet. Monitor hemoglobin A1c and glucose at next follow-up in 6 months.  3.  Vitamin B12 deficiency. She takes a vitamin B12 tab daily. Additionally, we periodically give 1000 mcg IM in the office. Most recent injection was 12/01/2022. 1000 mcg IM here today.  #4 preventative health care  Last mammogram was 04/2021.-->pt declines and further breast ca screening. She declines any further  colon cancer screening.  An After Visit Summary was printed and given to the patient.  FOLLOW UP: Return in about 6 months (around 01/27/2024) for routine chronic illness f/u.  Signed:  Santiago Bumpers, MD           07/29/2023

## 2023-07-29 NOTE — Addendum Note (Signed)
Addended by: Emi Holes D on: 07/29/2023 02:31 PM   Modules accepted: Orders

## 2023-08-26 ENCOUNTER — Encounter: Payer: Medicare PPO | Admitting: Physician Assistant

## 2023-09-27 DIAGNOSIS — M353 Polymyalgia rheumatica: Secondary | ICD-10-CM | POA: Diagnosis not present

## 2023-09-27 DIAGNOSIS — M25511 Pain in right shoulder: Secondary | ICD-10-CM | POA: Diagnosis not present

## 2023-09-27 DIAGNOSIS — M25512 Pain in left shoulder: Secondary | ICD-10-CM | POA: Diagnosis not present

## 2023-09-27 DIAGNOSIS — M79641 Pain in right hand: Secondary | ICD-10-CM | POA: Diagnosis not present

## 2023-09-27 DIAGNOSIS — Z6823 Body mass index (BMI) 23.0-23.9, adult: Secondary | ICD-10-CM | POA: Diagnosis not present

## 2023-09-27 DIAGNOSIS — M79642 Pain in left hand: Secondary | ICD-10-CM | POA: Diagnosis not present

## 2023-10-31 ENCOUNTER — Ambulatory Visit: Payer: Medicare PPO

## 2023-11-07 ENCOUNTER — Other Ambulatory Visit (HOSPITAL_COMMUNITY): Payer: Self-pay

## 2023-11-07 ENCOUNTER — Telehealth: Payer: Self-pay

## 2023-11-07 ENCOUNTER — Ambulatory Visit: Payer: Medicare PPO

## 2023-11-07 DIAGNOSIS — M810 Age-related osteoporosis without current pathological fracture: Secondary | ICD-10-CM | POA: Diagnosis not present

## 2023-11-07 MED ORDER — DENOSUMAB 60 MG/ML ~~LOC~~ SOSY
60.0000 mg | PREFILLED_SYRINGE | Freq: Once | SUBCUTANEOUS | Status: AC
Start: 2023-11-07 — End: 2023-11-07
  Administered 2023-11-07: 60 mg via SUBCUTANEOUS

## 2023-11-07 NOTE — Progress Notes (Signed)
Pt in for Prolia injection per Dr. Milinda Cave.  Injection tolerated well.  Pt scheduled for next injection in 6 months.

## 2023-11-07 NOTE — Telephone Encounter (Signed)
 Pt ready for scheduling for PROLIA  on or after : 11/07/23  Out-of-pocket cost due at time of visit: $40  Number of injection/visits approved: 2  Primary: [employer] Prolia  co-insurance: $40 Admin fee co-insurance: 0%  Secondary: --- Prolia  co-insurance:  Admin fee co-insurance:   Medical Benefit Details: Date Benefits were checked: 10/31/23 Deductible: NO/ Coinsurance: $40/ Admin Fee: 0%  Prior Auth: APPROVED PA# [id number] Expiration Date: 10/26/23-10/24/24  # of doses approved: 2  Pharmacy benefit: Copay $64 If patient wants fill through the pharmacy benefit please send prescription to: [employer], and include estimated need by date in rx notes. Pharmacy will ship medication directly to the office.  Patient NOT eligible for Prolia  Copay Card. Copay Card can make patient's cost as little as $25. Link to apply: [email or web address]://www.amgensupportplus.[email or web address]/copay  ** This summary of benefits is an estimation of the patient's out-of-pocket cost. Exact cost may very based on individual plan coverage.

## 2023-11-07 NOTE — Telephone Encounter (Signed)
 Karen Delacruz

## 2024-01-11 ENCOUNTER — Telehealth: Payer: Self-pay

## 2024-01-11 ENCOUNTER — Ambulatory Visit

## 2024-01-11 NOTE — Telephone Encounter (Signed)
 Reason for CRM: Please call to reschedule Phone Wellness Appointment  Patient is scheduled for 02/22/24

## 2024-02-03 ENCOUNTER — Encounter: Payer: Self-pay | Admitting: Nurse Practitioner

## 2024-02-03 ENCOUNTER — Ambulatory Visit: Payer: Self-pay

## 2024-02-03 ENCOUNTER — Ambulatory Visit: Admitting: Nurse Practitioner

## 2024-02-03 VITALS — BP 135/79 | HR 74 | Temp 97.7°F | Ht 67.0 in | Wt 145.0 lb

## 2024-02-03 DIAGNOSIS — R82998 Other abnormal findings in urine: Secondary | ICD-10-CM | POA: Diagnosis not present

## 2024-02-03 DIAGNOSIS — N939 Abnormal uterine and vaginal bleeding, unspecified: Secondary | ICD-10-CM | POA: Diagnosis not present

## 2024-02-03 DIAGNOSIS — R319 Hematuria, unspecified: Secondary | ICD-10-CM | POA: Diagnosis not present

## 2024-02-03 LAB — POCT URINALYSIS DIP (CLINITEK)
Bilirubin, UA: NEGATIVE
Glucose, UA: NEGATIVE mg/dL
Ketones, POC UA: NEGATIVE mg/dL
Nitrite, UA: NEGATIVE
Spec Grav, UA: 1.01 (ref 1.010–1.025)
Urobilinogen, UA: 0.2 U/dL
pH, UA: 7 (ref 5.0–8.0)

## 2024-02-03 LAB — POCT UA - MICROSCOPIC ONLY: Bacteria, U Microscopic: NEGATIVE

## 2024-02-03 MED ORDER — CEFDINIR 300 MG PO CAPS
300.0000 mg | ORAL_CAPSULE | Freq: Two times a day (BID) | ORAL | 0 refills | Status: DC
Start: 2024-02-03 — End: 2024-02-28

## 2024-02-03 NOTE — Progress Notes (Signed)
 Subjective:    Patient ID: Karen Delacruz, female    DOB: 1940/11/16, 83 y.o.   MRN: [id number]  HPI Presents today with vaginal bleeding and lower back pain. Says that she used the bathroom yesterday and saw pink on the toilet paper when wiping. Today, she describes it as more red. Reports small amounts of blood in underwear. Does have a pessary in place. Has not followed up with gynecology in years, and cleans/places the pessary on her own. Endorses right and left lower back pain that started a few days ago. Has a history of fibromyalgia, but says this pain is different. Denies pain or burning with urination or urgency. No other vaginal discharge or lesions reported. Has not been sexually active in the last 5 years. No history of recurrent urinary tract infections.   Review of Systems  Constitutional:  Negative for appetite change, chills, fatigue and fever.  Respiratory:  Negative for cough, chest tightness, shortness of breath and wheezing.   Cardiovascular:  Negative for chest pain and palpitations.  Gastrointestinal:  Negative for abdominal pain, nausea and vomiting.  Genitourinary:  Positive for flank pain and vaginal bleeding. Negative for difficulty urinating, dysuria, frequency, genital sores, menstrual problem, pelvic pain and vaginal discharge.      Objective:   Physical Exam Vitals and nursing note reviewed.  Constitutional:      General: She is not in acute distress.    Appearance: Normal appearance. She is not ill-appearing.  Cardiovascular:     Rate and Rhythm: Normal rate and regular rhythm.     Heart sounds: Normal heart sounds, S1 normal and S2 normal. No murmur heard. Pulmonary:     Effort: Pulmonary effort is normal. No respiratory distress.     Breath sounds: Normal breath sounds. No wheezing.  Abdominal:     Tenderness: There is abdominal tenderness in the suprapubic area. There is right CVA tenderness and left CVA tenderness.  Genitourinary:    Comments: Small  non-raised purple to red discolored area to the left of the cervical os noted. Friability in area noted with examination. No external or internal lesions noted.  Lymphadenopathy:     Cervical: No cervical adenopathy.  Skin:    General: Skin is warm and dry.  Neurological:     Mental Status: She is alert.    Vitals:   02/03/24 1120  BP: 135/79  Pulse: 74  Temp: 97.7 F (36.5 C)  Height: 5\' 7"  (1.702 m)  Weight: 145 lb (65.8 kg)  SpO2: 97%  BMI (Calculated): 22.71    Recent Results (from the past 2160 hours)  POCT URINALYSIS DIP (CLINITEK)     Status: Abnormal   Collection Time: 02/03/24 11:35 AM  Result Value Ref Range   Color, UA straw (A) yellow   Clarity, UA cloudy (A) clear   Glucose, UA negative negative mg/dL   Bilirubin, UA negative negative   Ketones, POC UA negative negative mg/dL   Spec Grav, UA 9.528 4.132 - 1.025   Blood, UA large (A) negative   pH, UA 7.0 5.0 - 8.0   POC PROTEIN,UA trace negative, trace   Urobilinogen, UA 0.2 0.2 or 1.0 E.U./dL   Nitrite, UA Negative Negative   Leukocytes, UA Large (3+) (A) Negative  POCT UA - Microscopic Only     Status: Abnormal   Collection Time: 02/03/24 12:35 PM  Result Value Ref Range   WBC, Ur, HPF, POC 0-5 0 - 5   RBC, Urine, Miroscopic 0-5 0 -  2   Bacteria, U Microscopic neg None - Trace   Mucus, UA     Epithelial cells, urine per micros rare    Crystals, Ur, HPF, POC     Casts, Ur, LPF, POC     Yeast, UA         Assessment & Plan:  1. Leukocytes in urine (Primary)  - POCT URINALYSIS DIP (CLINITEK) - Urine Culture - cefdinir (OMNICEF) 300 MG capsule; Take 1 capsule (300 mg total) by mouth 2 (two) times daily.  Dispense: 10 capsule; Refill: 0 - POCT UA - Microscopic Only -Advised patient to watch over the weekend for worsening symptoms such as vomiting or fever, and to get evaluated if symptoms persist.   2. Vaginal bleeding -Is already established at [facility], advised to follow-up with  gynecology for vaginal bleeding and discoloration on cervix. Advised patient that it may be bruising from pessary, but would like her to get evaluated by gynecology.   Return if symptoms worsen or fail to improve.   I have seen and examined this patient alongside the NP student. I have reviewed and verified the student note and agree with the assessment and plan.  Michelle Aid, FNP

## 2024-02-03 NOTE — Telephone Encounter (Signed)
  Chief Complaint: hematuria Symptoms: chills, blood in urine Frequency: twice since yesterday Pertinent Negatives: Patient denies nausea, vomiting, abdominal pain, back pain. Disposition: [] ED /[] Urgent Care (no appt availability in office) / [x] Appointment(In office/virtual)/ []  [facility]/ [] Home Care/ [] Refused Recommended Disposition /[] [facility]/ []  Follow-up with PCP Additional Notes: Patient's daughter, Archie Patten, calling in for triage. She is not with patient, limited triage based on daughter reporting what symptoms she knows. Per her request, called [facility] to confirm no available appts today and Dr Milinda Cave can not fit them in. Patient scheduled for acute visit this morning at [facility].  Copied from CRM [id number]. Topic: Clinical - Red Word Triage >> Feb 03, 2024 10:09 AM Lennart Pall wrote: Red Word that prompted transfer to Nurse Triage: Patietn daughter calling- she is not with the patient. Patient has blood in urine, body chills. Reason for Disposition  Blood in urine  (Exception: Could be normal menstrual bleeding.)  Answer Assessment - Initial Assessment Questions 1. COLOR of URINE: "Describe the color of the urine."  (e.g., tea-colored, pink, red, bloody) "Do you have blood clots in your urine?" (e.g., none, pea, grape, small coin)     Unsure, she states "I couldn't tell you, I really dont know" She states yesterday pale pink and then red.  2. ONSET: "When did the bleeding start?"      Yesterday.  3. EPISODES: "How many times has there been blood in the urine?" or "How many times today?"     Once last yesterday afternoon and once this morning.  4. PAIN with URINATION: "Is there any pain with passing your urine?" If Yes, ask: "How bad is the pain?"  (Scale 1-10; or mild, moderate, severe)    - MILD: Complains slightly about urination hurting.    - MODERATE: Interferes with normal activities.      - SEVERE: Excruciating, unwilling or unable to urinate because of the  pain.      She states she is unsure, patient did not mention.  5. FEVER: "Do you have a fever?" If Yes, ask: "What is your temperature, how was it measured, and when did it start?"     Denies.  6. ASSOCIATED SYMPTOMS: "Are you passing urine more frequently than usual?"     Unsure.  7. OTHER SYMPTOMS: "Do you have any other symptoms?" (e.g., back/flank pain, abdomen pain, vomiting)     Headache.  8. PREGNANCY: "Is there any chance you are pregnant?" "When was your last menstrual period?"     N/A.  Protocols used: Urine - Blood In-A-AH

## 2024-02-05 ENCOUNTER — Encounter: Payer: Self-pay | Admitting: Nurse Practitioner

## 2024-02-07 ENCOUNTER — Encounter: Payer: Self-pay | Admitting: Nurse Practitioner

## 2024-02-07 LAB — URINE CULTURE

## 2024-02-07 LAB — SPECIMEN STATUS REPORT

## 2024-02-09 ENCOUNTER — Ambulatory Visit: Payer: Self-pay

## 2024-02-09 NOTE — Telephone Encounter (Signed)
 Chief Complaint: back pain Symptoms: lower back pain, chills Frequency: 6 days Pertinent Negatives: Patient denies hematuria, frequency, urgency, dysuria Disposition: [] ED /[x] Urgent Care (no appt availability in office) / [] Appointment(In office/virtual)/ []  [facility]/ [] Home Care/ [] Refused Recommended Disposition /[] [facility]/ []  Follow-up with PCP Additional Notes: RN spoke to pt's daughter. Daughter states pt reports lower back pain and chills. Daughter does not believe the pt has a fever. Pt was seen 4/11 for hematuria and treated with cefdinir. Daughter states hematuria has resolved. Daughter does not know how severe pt's pain is, she is not currently with the pt. Daughter denies that pt reports frequency, urgency, dysuria, nausea, vomiting. Daughter denies that pt seems weak or lethargic. Daughter states pt is eating and drinking normally. RN advised daughter the pt should be seen within 4 hours. No availability in that timeframe. RN advised the daughter she should take her to an UC. Daughter states it is unlikely the pt will be agreeable to that but states she will try. In the meantime, RN advised daughter the office will follow-up about the possible need for scheduling in the case that the pt does not go to an UC. RN advised daughter if the pt develops hematuria, vomiting, weakness, fever she needs to go to the ED. Daughter verbalized understanding.    Copied from CRM [id number]. Topic: Clinical - Red Word Triage >> Feb 09, 2024  3:01 PM Ivette P wrote: [facility] that prompted transfer to Nurse Triage: Blood in urine, worsening symptoms. Reason for Disposition  [1] SEVERE back pain (e.g., excruciating, unable to do any normal activities) AND [2] not improved 2 hours after pain medicine  Answer Assessment - Initial Assessment Questions 1. ONSET: "When did the pain begin?"      Pt was seen 4/11 for hematuria, pt has had lower back pain since then (hematuria has  resolved) 2. LOCATION: "Where does it hurt?" (upper, mid or lower back)     Lower back, per daughter 3. SEVERITY: "How bad is the pain?"  (e.g., Scale 1-10; mild, moderate, or severe)   - MILD (1-3): Doesn't interfere with normal activities.    - MODERATE (4-7): Interferes with normal activities or awakens from sleep.    - SEVERE (8-10): Excruciating pain, unable to do any normal activities.      Daughter does not know, she is not with the patient 4. PATTERN: "Is the pain constant?" (e.g., yes, no; constant, intermittent)      "She just told me her back hurt, that's all I know" 5. RADIATION: "Does the pain shoot into your legs or somewhere else?"     "She has pain in her legs all the time because she has fibromyalgia" 6. CAUSE:  "What do you think is causing the back pain?"      Possible UTI? 7. BACK OVERUSE:  "Any recent lifting of heavy objects, strenuous work or exercise?"     No 8. MEDICINES: "What have you taken so far for the pain?" (e.g., nothing, acetaminophen, NSAIDS)     Finished cefdinir that was prescribed 4/11 9. NEUROLOGIC SYMPTOMS: "Do you have any weakness, numbness, or problems with bowel/bladder control?"     no 10. OTHER SYMPTOMS: "Do you have any other symptoms?" (e.g., fever, abdomen pain, burning with urination, blood in urine) Chills. Daughter states she is eating and drinking normally. Daughter denies weakness or confusion. Denies n/V. States hematuria has resolved. Denies that pt has reported dysuria. Denies frequency/urgency  Protocols used: Back Pain-A-AH

## 2024-02-09 NOTE — Telephone Encounter (Signed)
 Tried calling patient, unable to LVM

## 2024-02-13 DIAGNOSIS — Z6822 Body mass index (BMI) 22.0-22.9, adult: Secondary | ICD-10-CM | POA: Diagnosis not present

## 2024-02-13 DIAGNOSIS — M545 Low back pain, unspecified: Secondary | ICD-10-CM | POA: Diagnosis not present

## 2024-02-13 DIAGNOSIS — M353 Polymyalgia rheumatica: Secondary | ICD-10-CM | POA: Diagnosis not present

## 2024-02-13 DIAGNOSIS — G8929 Other chronic pain: Secondary | ICD-10-CM | POA: Diagnosis not present

## 2024-02-22 ENCOUNTER — Ambulatory Visit: Admitting: *Deleted

## 2024-02-22 DIAGNOSIS — Z0000 Encounter for general adult medical examination without abnormal findings: Secondary | ICD-10-CM

## 2024-02-22 NOTE — Progress Notes (Signed)
 Subjective:   Karen Delacruz is a 83 y.o. female who presents for Medicare Annual (Subsequent) preventive examination.  Visit Complete: Virtual I connected with  [name] on 02/22/24 by a audio enabled telemedicine application and verified that I am speaking with the correct person using two identifiers.  Patient Location: Home  Provider Location: Home Office  I discussed the limitations of evaluation and management by telemedicine. The patient expressed understanding and agreed to proceed.  Vital Signs: Because this visit was a virtual/telehealth visit, some criteria may be missing or patient reported. Any vitals not documented were not able to be obtained and vitals that have been documented are patient reported.   Cardiac Risk Factors include: advanced age (>2men, >35 women);hypertension     Objective:    There were no vitals filed for this visit. There is no height or weight on file to calculate BMI.     02/22/2024    4:14 PM 12/22/2022    3:07 PM 12/16/2021    8:06 AM 05/22/2021    9:43 AM 09/03/2020   11:20 AM 08/11/2017    6:45 AM 06/02/2017    9:40 AM  Advanced Directives  Does Patient Have a Medical Advance Directive? No No No No No No No  Would patient like information on creating a medical advance directive? No - Patient declined No - Patient declined No - Patient declined  Yes (MAU/Ambulatory/Procedural Areas - Information given) No - Patient declined No - Patient declined    Current Medications (verified) Outpatient Encounter Medications as of 02/22/2024  Medication Sig   amLODipine  (NORVASC ) 5 MG tablet Take 1 tablet (5 mg total) by mouth daily.   Ascorbic Acid (VITAMIN C PO) Take by mouth daily.   cefdinir  (OMNICEF ) 300 MG capsule Take 1 capsule (300 mg total) by mouth 2 (two) times daily.   cyanocobalamin  (,VITAMIN B-12,) 1000 MCG/ML injection Inject 1 mL (1,000 mcg total) into the muscle once.   Multiple Vitamins-Minerals (MULTIVITAMIN ADULTS) TABS Take  by mouth daily.   predniSONE  (DELTASONE ) 1 MG tablet Take 7 mg by mouth daily with breakfast. Slow ween per rheumatology   Vitamin D , Ergocalciferol , (DRISDOL ) 1.25 MG (50000 UNIT) CAPS capsule Take 1 capsule (50,000 Units total) by mouth every 7 (seven) days.   No facility-administered encounter medications on file as of 02/22/2024.    Allergies (verified) Ciprofloxacin , Codeine, Celebrex [celecoxib], and Penicillins   History: Past Medical History:  Diagnosis Date   Abnormal EKG 05/2016   LAFB, voltage criteria for LVH in lead aVL.   Anxiety    Cataract    right   Chronic left shoulder pain    RC impingement + osteoarthritis. as of 09/2020 ortho eval, surg recommended but pt holding off, got steroid inj   Colon cancer screening 06/2017   Cologuard positive--colonoscopy showed adenomatous polyp with high grade dysplasia--recall 3 yrs.   COVID-19 virus infection 11/17/2020   Mild URI   Cystocele, unspecified (CODE) 2018   Pessary fitted at Dr. Deneen [facility]:  Milex ring with support #3--great improvement.   Degenerative arthritis of cervical spine    Fatty liver    Fibromyalgia    GERD (gastroesophageal reflux disease)    History of hiatal hernia    Hx: UTI (urinary tract infection)    recurrent.  Pyelo hospitalization 2012   Hypertension    Idiopathic urticaria 02/2018   Microhematuria 02/2012   Cystoscopy with bladder biopsy and cystouretograms--pathology benign.   Myalgia    Hx of diffuse  myalgias, onset in her 72s   Osteoarthritis of left shoulder    Recurrent L shoulder pain   Osteoporosis 08/10/2016   T-score -2.5: started fosamax  07/2016.  Intol fosamax ; started prolia  08/27/2016, most recent prolia  was 08/30/2018. DEXA 11/2018 T score -2.3 (improved). 04/2021 DEXA T score -2.4.   Periesophageal hiatal hernia 02/2016   Type 4   Peripheral neuropathy    NCS/EMGs 06/2021 chronic sensorimotor axonal polyneuropathy. Gabapentin  no help   Polymyalgia rheumatica (HCC)    2024    Thickened endometrium 10/16/2011   on CT.  F/u pelvic u/s showed nabothian cyst to account for this..  No enometrial pathology noted on u/s, no ovaries visualized.   Vertigo    Vitamin B12 deficiency 11/2015   + LL PN   Vitamin D  deficiency    Past Surgical History:  Procedure Laterality Date   BACK SURGERY  1990s x 2   Dr. Susann Eon in [place]   CHOLECYSTECTOMY     COLONOSCOPY  approx 2002; 08/11/17   2002; Diverticulosis, int hemorrhoids ([facility]).  2018 (for + cologuard)--adenomatous polyps with high grade dysplasia (recall 3 yrs), also diverticulosis.   COLONOSCOPY N/A 08/11/2017   Procedure: COLONOSCOPY;  Surgeon: Ruby Corporal, MD;  Location: [facility];  Service: Endoscopy;  Laterality: N/A;  7:30   DEXA  12/06/2018   T score -2.3 (improved)->continue prolia  q69mo.  04/2021 T score -2.4.   ESOPHAGEAL MANOMETRY N/A 04/19/2016   Normal.  Procedure: ESOPHAGEAL MANOMETRY (EM);  Surgeon: Sergio Dandy, MD;  Location: [facility];  Service: Endoscopy;  Laterality: N/A;   EYE SURGERY Right    cataract extraction with IOL   HYSTEROSCOPY  11/2004   Cervical hystogram    INSERTION OF MESH N/A 06/18/2016   Procedure: INSERTION OF MESH;  Surgeon: Candyce Champagne, MD;  Location: [facility];  Service: General;  Laterality: N/A;   NCS/EMG  07/16/2021   NCS/EMGs 06/2021 chronic sensorimotor axonal polyneuropathy (Dr. Lydia Sams)   NISSEN FUNDOPLICATION  06/18/2016   POLYPECTOMY  08/11/2017   Procedure: POLYPECTOMY;  Surgeon: Ruby Corporal, MD;  Location: [facility];  Service: Endoscopy;;  colon    Family History  Problem Relation Age of Onset   Cancer Mother        bladder   Other Father        had bladder issues   Social History   Socioeconomic History   Marital status: Married    Spouse name: Not on file   Number of children: Not on file   Years of education: Not on file   Highest education level: 10th grade  Occupational History   Not on file  Tobacco Use   Smoking  status: Never   Smokeless tobacco: Never  Vaping Use   Vaping status: Never Used  Substance and Sexual Activity   Alcohol use: No   Drug use: No   Sexual activity: Yes    Birth control/protection: Post-menopausal  Other Topics Concern   Not on file  Social History Narrative   Married, lives with husband in [place].  Has two grown children.   Retired from [employer], then un-retired to go back to same job--full time.   No t/a/d.   No formal exercise.   Normal american diet.   Social Drivers of Corporate investment banker Strain: Low Risk  (02/22/2024)   Overall Financial Resource Strain (CARDIA)    Difficulty of Paying Living Expenses: Not hard at all  Food Insecurity: No Food  Insecurity (02/22/2024)   Hunger Vital Sign    Worried About Running Out of Food in the Last Year: Never true    Ran Out of Food in the Last Year: Never true  Transportation Needs: No Transportation Needs (01/06/2023)   PRAPARE - Administrator, Civil Service (Medical): No    Lack of Transportation (Non-Medical): No  Physical Activity: Inactive (02/22/2024)   Exercise Vital Sign    Days of Exercise per Week: 0 days    Minutes of Exercise per Session: 0 min  Stress: No Stress Concern Present (02/22/2024)   [employer] of Occupational Health - Occupational Stress Questionnaire    Feeling of Stress : Not at all  Social Connections: Moderately Integrated (02/22/2024)   Social Connection and Isolation Panel [NHANES]    Frequency of Communication with Friends and Family: Three times a week    Frequency of Social Gatherings with Friends and Family: Once a week    Attends Religious Services: 1 to 4 times per year    Active Member of [employer] or Organizations: No    Attends Engineer, structural: Never    Marital Status: Married    Tobacco Counseling Counseling given: Not Answered   Clinical Intake:  Pre-visit preparation completed: Yes  Pain : No/denies pain      Diabetes: No  How often do you need to have someone help you when you read instructions, pamphlets, or other written materials from your doctor or pharmacy?: 1 - Never  Interpreter Needed?: No  Information entered by :: Kieth Pelt LPN   Activities of Daily Living    02/22/2024    3:43 PM  In your present state of health, do you have any difficulty performing the following activities:  Hearing? 0  Vision? 0  Difficulty concentrating or making decisions? 0  Walking or climbing stairs? 0  Dressing or bathing? 0  Doing errands, shopping? 0  Preparing Food and eating ? N  Using the Toilet? N  In the past six months, have you accidently leaked urine? N  Do you have problems with loss of bowel control? N  Managing your Medications? N  Managing your Finances? N  Housekeeping or managing your Housekeeping? N    Patient Care Team: Shelvia Dick, MD as PCP - General (Family Medicine) Candyce Champagne, MD as Consulting Physician (General Surgery) Nandigam, Kavitha V, MD as Consulting Physician (Gastroenterology) Wendelyn Halter, MD as Consulting Physician (Obstetrics and Gynecology) Ruby Corporal, MD (Inactive) as Consulting Physician (Gastroenterology) Patel, Donika K, DO as Consulting Physician (Neurology) Winston Hawking, MD as Consulting Physician (Orthopedic Surgery)  Indicate any recent Medical Services you may have received from other than [facility] providers in the past year (date may be approximate).     Assessment:   This is a routine wellness examination for [place].  Hearing/Vision screen Hearing Screening - Comments:: No trouble hearing Vision Screening - Comments:: [facility] Up to date   Goals Addressed             This Visit's Progress    Patient Stated   On track    Maintain current health     Patient Stated   On track    None at this time      Patient Stated   On track    Stay healthy and active      Patient Stated       Stay healthy        Depression Screen  02/22/2024    3:45 PM 03/28/2023    3:04 PM 12/22/2022    3:06 PM 12/16/2021    8:05 AM 09/03/2020   11:23 AM 01/26/2020    8:47 AM 11/14/2019    3:09 PM  PHQ 2/9 Scores  PHQ - 2 Score 0 0 0 0 0 0 1  PHQ- 9 Score 0          Fall Risk    02/22/2024    3:41 PM 03/28/2023    3:04 PM 01/06/2023    8:09 PM 12/22/2022    3:08 PM 12/16/2021    8:08 AM  Fall Risk   Falls in the past year? 0 0 1 0 0  Number falls in past yr: 0 0 0 0 0  Injury with Fall? 0 0 1 0 0  Risk for fall due to :  No Fall Risks  Impaired vision Impaired vision  Follow up Falls evaluation completed;Education provided;Falls prevention discussed Falls evaluation completed  Falls prevention discussed Falls prevention discussed    MEDICARE RISK AT HOME: Medicare Risk at Home Any stairs in or around the home?: Yes If so, are there any without handrails?: No Home free of loose throw rugs in walkways, pet beds, electrical cords, etc?: Yes Adequate lighting in your home to reduce risk of falls?: Yes Life alert?: No Use of a cane, walker or w/c?: No Grab bars in the bathroom?: Yes Shower chair or bench in shower?: Yes Elevated toilet seat or a handicapped toilet?: Yes  TIMED UP AND GO:  Was the test performed?  No    Cognitive Function:        02/22/2024    3:42 PM 12/22/2022    3:10 PM 12/16/2021    8:10 AM 09/03/2020   11:28 AM  6CIT Screen  What Year? 0 points 0 points 0 points 0 points  What month? 0 points 0 points 0 points 0 points  What time? 0 points 0 points 0 points 0 points  Count back from 20 0 points 0 points 0 points 0 points  Months in reverse 4 points 4 points 0 points 4 points  Repeat phrase 4 points 4 points 0 points 0 points  Total Score 8 points 8 points 0 points 4 points    Immunizations Immunization History  Administered Date(s) Administered   Fluad Quad(high Dose 65+) 08/14/2019, 07/11/2020, 07/24/2021, 08/19/2022   Influenza Split 07/31/2012   Influenza,  High Dose Seasonal PF 07/31/2015, 08/04/2016, 07/29/2017, 07/17/2018   Influenza,inj,Quad PF,6+ Mos 07/17/2014   Influenza,inj,quad, With Preservative 07/25/2020   PFIZER(Purple Top)SARS-COV-2 Vaccination 01/31/2020, 02/22/2020   Pneumococcal Conjugate-13 08/21/2014   Pneumococcal Polysaccharide-23 12/19/2015   Pneumococcal-Unspecified 10/25/2018   Tdap 07/31/2012   Zoster Recombinant(Shingrix) 01/05/2018, 03/28/2018    TDAP status: Due, Education has been provided regarding the importance of this vaccine. Advised may receive this vaccine at local pharmacy or Health Dept. Aware to provide a copy of the vaccination record if obtained from local pharmacy or Health Dept. Verbalized acceptance and understanding.  Flu Vaccine status: Up to date  Pneumococcal vaccine status: Up to date  Covid-19 vaccine status: Information provided on how to obtain vaccines.   Qualifies for Shingles Vaccine? No   Zostavax completed Yes   Shingrix Completed?: Yes  Screening Tests Health Maintenance  Topic Date Due   Colonoscopy  08/11/2020   COVID-19 Vaccine (3 - 2024-25 season) 06/26/2023   INFLUENZA VACCINE  05/25/2024   Medicare Annual Wellness (AWV)  02/21/2025   Pneumonia  Vaccine 23+ Years old  Completed   DEXA SCAN  Completed   Zoster Vaccines- Shingrix  Completed   HPV VACCINES  Aged Out   Meningococcal B Vaccine  Aged Out   DTaP/Tdap/Td  Discontinued    Health Maintenance  Health Maintenance Due  Topic Date Due   Colonoscopy  08/11/2020   COVID-19 Vaccine (3 - 2024-25 season) 06/26/2023    Colorectal cancer screening: No longer required.   Mammogram status: No longer required due to  .  Bone Density status: Completed 2022. Results reflect: Bone density results: OSTEOPOROSIS. Repeat every education provided  years.  Lung Cancer Screening: (Low Dose CT Chest recommended if Age 58-80 years, 20 pack-year currently smoking OR have quit w/in 15years.) does not qualify.   Lung Cancer  Screening Referral:   Additional Screening:  Hepatitis C Screening: does not qualify;   Vision Screening: Recommended annual ophthalmology exams for early detection of glaucoma and other disorders of the eye. Is the patient up to date with their annual eye exam?  Yes  Who is the provider or what is the name of the office in which the patient attends annual eye exams?  [facility] If pt is not established with a provider, would they like to be referred to a provider to establish care? No .   Dental Screening: Recommended annual dental exams for proper oral hygiene   Community Resource Referral / Chronic Care Management: CRR required this visit?  No   CCM required this visit?  No     Plan:     I have personally reviewed and noted the following in the patient's chart:   Medical and social history Use of alcohol, tobacco or illicit drugs  Current medications and supplements including opioid prescriptions. Patient is not currently taking opioid prescriptions. Functional ability and status Nutritional status Physical activity Advanced directives List of other physicians Hospitalizations, surgeries, and ER visits in previous 12 months Vitals Screenings to include cognitive, depression, and falls Referrals and appointments  In addition, I have reviewed and discussed with patient certain preventive protocols, quality metrics, and best practice recommendations. A written personalized care plan for preventive services as well as general preventive health recommendations were provided to patient.     Kieth Pelt, LPN   1/61/0960   After Visit Summary: (MyChart) Due to this being a telephonic visit, the after visit summary with patients personalized plan was offered to patient via MyChart   Nurse Notes:

## 2024-02-22 NOTE — Patient Instructions (Signed)
 [name] , Thank you for taking time to come for your Medicare Wellness Visit. I appreciate your ongoing commitment to your health goals. Please review the following plan we discussed and let me know if I can assist you in the future.   Screening recommendations/referrals: Colonoscopy: no longer required Mammogram:  Bone Density: Education provided Recommended yearly ophthalmology/optometry visit for glaucoma screening and checkup Recommended yearly dental visit for hygiene and checkup  Vaccinations: Influenza vaccine: up to date Pneumococcal vaccine: up to date Tdap vaccine: up to date Shingles vaccine: up to date        Preventive Care 65 Years and Older, Female Preventive care refers to lifestyle choices and visits with your health care provider that can promote health and wellness. What does preventive care include? A yearly physical exam. This is also called an annual well check. Dental exams once or twice a year. Routine eye exams. Ask your health care provider how often you should have your eyes checked. Personal lifestyle choices, including: Daily care of your teeth and gums. Regular physical activity. Eating a healthy diet. Avoiding tobacco and drug use. Limiting alcohol use. Practicing safe sex. Taking low-dose aspirin  every day. Taking vitamin and mineral supplements as recommended by your health care provider. What happens during an annual well check? The services and screenings done by your health care provider during your annual well check will depend on your age, overall health, lifestyle risk factors, and family history of disease. Counseling  Your health care provider may ask you questions about your: Alcohol use. Tobacco use. Drug use. Emotional well-being. Home and relationship well-being. Sexual activity. Eating habits. History of falls. Memory and ability to understand (cognition). Work and work Astronomer. Reproductive health. Screening  You  may have the following tests or measurements: Height, weight, and BMI. Blood pressure. Lipid and cholesterol levels. These may be checked every 5 years, or more frequently if you are over 16 years old. Skin check. Lung cancer screening. You may have this screening every year starting at age 2 if you have a 30-pack-year history of smoking and currently smoke or have quit within the past 15 years. Fecal occult blood test (FOBT) of the stool. You may have this test every year starting at age 43. Flexible sigmoidoscopy or colonoscopy. You may have a sigmoidoscopy every 5 years or a colonoscopy every 10 years starting at age 78. Hepatitis C blood test. Hepatitis B blood test. Sexually transmitted disease (STD) testing. Diabetes screening. This is done by checking your blood sugar (glucose) after you have not eaten for a while (fasting). You may have this done every 1-3 years. Bone density scan. This is done to screen for osteoporosis. You may have this done starting at age 77. Mammogram. This may be done every 1-2 years. Talk to your health care provider about how often you should have regular mammograms. Talk with your health care provider about your test results, treatment options, and if necessary, the need for more tests. Vaccines  Your health care provider may recommend certain vaccines, such as: Influenza vaccine. This is recommended every year. Tetanus, diphtheria, and acellular pertussis (Tdap, Td) vaccine. You may need a Td booster every 10 years. Zoster vaccine. You may need this after age 82. Pneumococcal 13-valent conjugate (PCV13) vaccine. One dose is recommended after age 84. Pneumococcal polysaccharide (PPSV23) vaccine. One dose is recommended after age 21. Talk to your health care provider about which screenings and vaccines you need and how often you need them. This information  is not intended to replace advice given to you by your health care provider. Make sure you discuss any  questions you have with your health care provider. Document Released: 11/07/2015 Document Revised: 06/30/2016 Document Reviewed: 08/12/2015 Elsevier Interactive Patient Education  2017 [employer].  Fall Prevention in the Home Falls can cause injuries. They can happen to people of all ages. There are many things you can do to make your home safe and to help prevent falls. What can I do on the outside of my home? Regularly fix the edges of walkways and driveways and fix any cracks. Remove anything that might make you trip as you walk through a door, such as a raised step or threshold. Trim any bushes or trees on the path to your home. Use bright outdoor lighting. Clear any walking paths of anything that might make someone trip, such as rocks or tools. Regularly check to see if handrails are loose or broken. Make sure that both sides of any steps have handrails. Any raised decks and porches should have guardrails on the edges. Have any leaves, snow, or ice cleared regularly. Use sand or salt on walking paths during winter. Clean up any spills in your garage right away. This includes oil or grease spills. What can I do in the bathroom? Use night lights. Install grab bars by the toilet and in the tub and shower. Do not use towel bars as grab bars. Use non-skid mats or decals in the tub or shower. If you need to sit down in the shower, use a plastic, non-slip stool. Keep the floor dry. Clean up any water  that spills on the floor as soon as it happens. Remove soap buildup in the tub or shower regularly. Attach bath mats securely with double-sided non-slip rug tape. Do not have throw rugs and other things on the floor that can make you trip. What can I do in the bedroom? Use night lights. Make sure that you have a light by your bed that is easy to reach. Do not use any sheets or blankets that are too big for your bed. They should not hang down onto the floor. Have a firm chair that has side  arms. You can use this for support while you get dressed. Do not have throw rugs and other things on the floor that can make you trip. What can I do in the kitchen? Clean up any spills right away. Avoid walking on wet floors. Keep items that you use a lot in easy-to-reach places. If you need to reach something above you, use a strong step stool that has a grab bar. Keep electrical cords out of the way. Do not use floor polish or wax that makes floors slippery. If you must use wax, use non-skid floor wax. Do not have throw rugs and other things on the floor that can make you trip. What can I do with my stairs? Do not leave any items on the stairs. Make sure that there are handrails on both sides of the stairs and use them. Fix handrails that are broken or loose. Make sure that handrails are as long as the stairways. Check any carpeting to make sure that it is firmly attached to the stairs. Fix any carpet that is loose or worn. Avoid having throw rugs at the top or bottom of the stairs. If you do have throw rugs, attach them to the floor with carpet tape. Make sure that you have a light switch at the top of  the stairs and the bottom of the stairs. If you do not have them, ask someone to add them for you. What else can I do to help prevent falls? Wear shoes that: Do not have high heels. Have rubber bottoms. Are comfortable and fit you well. Are closed at the toe. Do not wear sandals. If you use a stepladder: Make sure that it is fully opened. Do not climb a closed stepladder. Make sure that both sides of the stepladder are locked into place. Ask someone to hold it for you, if possible. Clearly mark and make sure that you can see: Any grab bars or handrails. First and last steps. Where the edge of each step is. Use tools that help you move around (mobility aids) if they are needed. These include: Canes. Walkers. Scooters. Crutches. Turn on the lights when you go into a dark area.  Replace any light bulbs as soon as they burn out. Set up your furniture so you have a clear path. Avoid moving your furniture around. If any of your floors are uneven, fix them. If there are any pets around you, be aware of where they are. Review your medicines with your doctor. Some medicines can make you feel dizzy. This can increase your chance of falling. Ask your doctor what other things that you can do to help prevent falls. This information is not intended to replace advice given to you by your health care provider. Make sure you discuss any questions you have with your health care provider. Document Released: 08/07/2009 Document Revised: 03/18/2016 Document Reviewed: 11/15/2014 Elsevier Interactive Patient Education  2017 [employer].

## 2024-02-29 ENCOUNTER — Ambulatory Visit: Admitting: Family Medicine

## 2024-02-29 ENCOUNTER — Encounter: Payer: Self-pay | Admitting: Family Medicine

## 2024-02-29 VITALS — BP 113/70 | HR 72 | Temp 98.4°F | Ht 66.0 in | Wt 143.4 lb

## 2024-02-29 DIAGNOSIS — E559 Vitamin D deficiency, unspecified: Secondary | ICD-10-CM | POA: Diagnosis not present

## 2024-02-29 DIAGNOSIS — G8929 Other chronic pain: Secondary | ICD-10-CM | POA: Diagnosis not present

## 2024-02-29 DIAGNOSIS — M75102 Unspecified rotator cuff tear or rupture of left shoulder, not specified as traumatic: Secondary | ICD-10-CM | POA: Diagnosis not present

## 2024-02-29 DIAGNOSIS — M7542 Impingement syndrome of left shoulder: Secondary | ICD-10-CM | POA: Diagnosis not present

## 2024-02-29 DIAGNOSIS — Z0000 Encounter for general adult medical examination without abnormal findings: Secondary | ICD-10-CM

## 2024-02-29 DIAGNOSIS — E538 Deficiency of other specified B group vitamins: Secondary | ICD-10-CM

## 2024-02-29 DIAGNOSIS — M19012 Primary osteoarthritis, left shoulder: Secondary | ICD-10-CM | POA: Diagnosis not present

## 2024-02-29 DIAGNOSIS — I10 Essential (primary) hypertension: Secondary | ICD-10-CM | POA: Diagnosis not present

## 2024-02-29 DIAGNOSIS — M25512 Pain in left shoulder: Secondary | ICD-10-CM | POA: Diagnosis not present

## 2024-02-29 DIAGNOSIS — M810 Age-related osteoporosis without current pathological fracture: Secondary | ICD-10-CM | POA: Diagnosis not present

## 2024-02-29 LAB — LIPID PANEL
Cholesterol: 139 mg/dL (ref 0–200)
HDL: 48.7 mg/dL (ref >39.00)
LDL Cholesterol: 69 mg/dL (ref 0–99)
NonHDL: 90.2
Total CHOL/HDL Ratio: 3
Triglycerides: 104 mg/dL (ref 0.0–149.0)
VLDL: 20.8 mg/dL (ref 0.0–40.0)

## 2024-02-29 LAB — CBC WITH DIFFERENTIAL/PLATELET
Basophils Absolute: 0.1 10*3/uL (ref 0.0–0.1)
Basophils Relative: 0.7 % (ref 0.0–3.0)
Eosinophils Absolute: 0.1 10*3/uL (ref 0.0–0.7)
Eosinophils Relative: 1.8 % (ref 0.0–5.0)
HCT: 39.7 % (ref 36.0–46.0)
Hemoglobin: 13.5 g/dL (ref 12.0–15.0)
Lymphocytes Relative: 32.6 % (ref 12.0–46.0)
Lymphs Abs: 2.7 10*3/uL (ref 0.7–4.0)
MCHC: 33.9 g/dL (ref 30.0–36.0)
MCV: 96.2 fl (ref 78.0–100.0)
Monocytes Absolute: 0.7 10*3/uL (ref 0.1–1.0)
Monocytes Relative: 8.1 % (ref 3.0–12.0)
Neutro Abs: 4.7 10*3/uL (ref 1.4–7.7)
Neutrophils Relative %: 56.8 % (ref 43.0–77.0)
Platelets: 271 10*3/uL (ref 150.0–400.0)
RBC: 4.13 Mil/uL (ref 3.87–5.11)
RDW: 13.7 % (ref 11.5–15.5)
WBC: 8.3 10*3/uL (ref 4.0–10.5)

## 2024-02-29 LAB — COMPREHENSIVE METABOLIC PANEL WITH GFR
ALT: 7 U/L (ref 0–35)
AST: 17 U/L (ref 0–37)
Albumin: 4.1 g/dL (ref 3.5–5.2)
Alkaline Phosphatase: 45 U/L (ref 39–117)
BUN: 11 mg/dL (ref 6–23)
CO2: 30 meq/L (ref 19–32)
Calcium: 9 mg/dL (ref 8.4–10.5)
Chloride: 104 meq/L (ref 96–112)
Creatinine, Ser: 0.6 mg/dL (ref 0.40–1.20)
GFR: 83.77 mL/min (ref >60.00)
Glucose, Bld: 93 mg/dL (ref 70–99)
Potassium: 3.5 meq/L (ref 3.5–5.1)
Sodium: 141 meq/L (ref 135–145)
Total Bilirubin: 0.7 mg/dL (ref 0.2–1.2)
Total Protein: 6.7 g/dL (ref 6.0–8.3)

## 2024-02-29 LAB — VITAMIN D 25 HYDROXY (VIT D DEFICIENCY, FRACTURES): VITD: 54.38 ng/mL (ref 30.00–100.00)

## 2024-02-29 LAB — VITAMIN B12: Vitamin B-12: >1537 pg/mL — ABNORMAL HIGH (ref 211–911)

## 2024-02-29 MED ORDER — TRIAMCINOLONE ACETONIDE 40 MG/ML IJ SUSP
40.0000 mg | Freq: Once | INTRAMUSCULAR | Status: AC
  Administered 2024-02-29: 40 mg via INTRA_ARTICULAR

## 2024-02-29 NOTE — Progress Notes (Signed)
 Office Note 02/29/2024  CC:  Chief Complaint  Patient presents with   Annual Exam    Pt is fasting    HPI:  Patient is a 83 y.o. female who is here accompanied by her husband Donavon Fudge for annual health maintenance exam and f/u HTN, vit D def, and vit B12 def.  She has had return of her left shoulder pain over the last couple of months.  Hurts her every day, particularly when reaching outward and upward.  She does have pain constantly but significantly worse with movement. No acute injury prior to the recurrence of her pain. She has responded very well to a subacromial steroid injection (08/17/2021). She does have a known history of left shoulder arthritis and rotator cuff tear, see PMH section below for details.  She sees a rheumatologist for her polymyalgia rheumatica.  Her symptoms or primary neuropathic type pain plus generalized weakness in her legs.  She responded well to prednisone  and has been on a very long taper and just recently completed this.  She is currently off prednisone .   Past Medical History:  Diagnosis Date   Abnormal EKG 05/2016   LAFB, voltage criteria for LVH in lead aVL.   Anxiety    Cataract    right   Chronic left shoulder pain    RC impingement + osteoarthritis. as of 09/2020 ortho eval, surg recommended but pt holding off, got steroid inj   Colon cancer screening 06/2017   Cologuard positive--colonoscopy showed adenomatous polyp with high grade dysplasia--recall 3 yrs.   COVID-19 virus infection 11/17/2020   Mild URI   Cystocele, unspecified (CODE) 2018   Pessary fitted at Dr. Deneen Finical:  Milex ring with support #3--great improvement.   Degenerative arthritis of cervical spine    Fatty liver    Fibromyalgia    GERD (gastroesophageal reflux disease)    History of hiatal hernia    Hx: UTI (urinary tract infection)    recurrent.  Pyelo hospitalization 2012   Hypertension    Idiopathic urticaria 02/2018   Microhematuria 02/2012   Cystoscopy with  bladder biopsy and cystouretograms--pathology benign.   Myalgia    Hx of diffuse myalgias, onset in her 60s   Osteoarthritis of left shoulder    Recurrent L shoulder pain   Osteoporosis 08/10/2016   T-score -2.5: started fosamax  07/2016.  Intol fosamax ; started prolia  08/27/2016, most recent prolia  was 08/30/2018. DEXA 11/2018 T score -2.3 (improved). 04/2021 DEXA T score -2.4.   Periesophageal hiatal hernia 02/2016   Type 4   Peripheral neuropathy    NCS/EMGs 06/2021 chronic sensorimotor axonal polyneuropathy. Gabapentin  no help   Polymyalgia rheumatica (HCC)    2024   Thickened endometrium 10/16/2011   on CT.  F/u pelvic u/s showed nabothian cyst to account for this..  No enometrial pathology noted on u/s, no ovaries visualized.   Vertigo    Vitamin B12 deficiency 11/2015   + LL PN   Vitamin D  deficiency     Past Surgical History:  Procedure Laterality Date   BACK SURGERY  1990s x 2   Dr. Susann Eon in [place]   CHOLECYSTECTOMY     COLONOSCOPY  approx 2002; 08/11/17   2002; Diverticulosis, int hemorrhoids ([facility]).  2018 (for + cologuard)--adenomatous polyps with high grade dysplasia (recall 3 yrs), also diverticulosis.   COLONOSCOPY N/A 08/11/2017   Procedure: COLONOSCOPY;  Surgeon: Ruby Corporal, MD;  Location: [facility];  Service: Endoscopy;  Laterality: N/A;  7:30   DEXA  12/06/2018  T score -2.3 (improved)->continue prolia  q98mo.  04/2021 T score -2.4.   ESOPHAGEAL MANOMETRY N/A 04/19/2016   Normal.  Procedure: ESOPHAGEAL MANOMETRY (EM);  Surgeon: Sergio Dandy, MD;  Location: [facility];  Service: Endoscopy;  Laterality: N/A;   EYE SURGERY Right    cataract extraction with IOL   HYSTEROSCOPY  11/2004   Cervical hystogram    INSERTION OF MESH N/A 06/18/2016   Procedure: INSERTION OF MESH;  Surgeon: Candyce Champagne, MD;  Location: [facility];  Service: General;  Laterality: N/A;   NCS/EMG  07/16/2021   NCS/EMGs 06/2021 chronic sensorimotor axonal polyneuropathy (Dr.  Lydia Sams)   NISSEN FUNDOPLICATION  06/18/2016   POLYPECTOMY  08/11/2017   Procedure: POLYPECTOMY;  Surgeon: Ruby Corporal, MD;  Location: [facility];  Service: Endoscopy;;  colon     Family History  Problem Relation Age of Onset   Cancer Mother        bladder   Other Father        had bladder issues    Social History   Socioeconomic History   Marital status: Married    Spouse name: Not on file   Number of children: Not on file   Years of education: Not on file   Highest education level: 9th grade  Occupational History   Not on file  Tobacco Use   Smoking status: Never   Smokeless tobacco: Never  Vaping Use   Vaping status: Never Used  Substance and Sexual Activity   Alcohol use: No   Drug use: No   Sexual activity: Yes    Birth control/protection: Post-menopausal  Other Topics Concern   Not on file  Social History Narrative   Married, lives with husband in [place].  Has two grown children.   Retired from [employer], then un-retired to go back to same job--full time.   No t/a/d.   No formal exercise.   Normal american diet.   Social Drivers of Corporate investment banker Strain: Low Risk  (02/27/2024)   Overall Financial Resource Strain (CARDIA)    Difficulty of Paying Living Expenses: Not very hard  Food Insecurity: No Food Insecurity (02/27/2024)   Hunger Vital Sign    Worried About Running Out of Food in the Last Year: Never true    Ran Out of Food in the Last Year: Never true  Transportation Needs: No Transportation Needs (02/27/2024)   PRAPARE - Administrator, Civil Service (Medical): No    Lack of Transportation (Non-Medical): No  Physical Activity: Inactive (02/27/2024)   Exercise Vital Sign    Days of Exercise per Week: 0 days    Minutes of Exercise per Session: 0 min  Stress: No Stress Concern Present (02/27/2024)   [employer] of Occupational Health - Occupational Stress Questionnaire    Feeling of Stress : Only a little   Social Connections: Moderately Isolated (02/27/2024)   Social Connection and Isolation Panel [NHANES]    Frequency of Communication with Friends and Family: More than three times a week    Frequency of Social Gatherings with Friends and Family: Once a week    Attends Religious Services: Never    Database administrator or Organizations: No    Attends Banker Meetings: Never    Marital Status: Married  Catering manager Violence: Not At Risk (02/22/2024)   Humiliation, Afraid, Rape, and Kick questionnaire    Fear of Current or Ex-Partner: No    Emotionally Abused: No  Physically Abused: No    Sexually Abused: No    Outpatient Medications Prior to Visit  Medication Sig Dispense Refill   amLODipine  (NORVASC ) 5 MG tablet Take 1 tablet (5 mg total) by mouth daily. 90 tablet 3   Ascorbic Acid (VITAMIN C PO) Take by mouth daily.     cyanocobalamin  (,VITAMIN B-12,) 1000 MCG/ML injection Inject 1 mL (1,000 mcg total) into the muscle once. 1 mL 0   Multiple Vitamins-Minerals (MULTIVITAMIN ADULTS) TABS Take by mouth daily.     Vitamin D , Ergocalciferol , (DRISDOL ) 1.25 MG (50000 UNIT) CAPS capsule Take 1 capsule (50,000 Units total) by mouth every 7 (seven) days. 12 capsule 3   cefdinir  (OMNICEF ) 300 MG capsule Take 1 capsule (300 mg total) by mouth 2 (two) times daily. 10 capsule 0   predniSONE  (DELTASONE ) 1 MG tablet Take 7 mg by mouth daily with breakfast. Slow ween per rheumatology     No facility-administered medications prior to visit.    Allergies  Allergen Reactions   Ciprofloxacin  Nausea And Vomiting   Codeine Nausea And Vomiting and Other (See Comments)    Feels funny   Celebrex [Celecoxib] Rash   Penicillins Rash    Has patient had a PCN reaction causing immediate rash, facial/tongue/throat swelling, SOB or lightheadedness with hypotension: Rash Has patient had a PCN reaction causing severe rash involving mucus membranes or skin necrosis: No Has patient had a PCN  reaction that required hospitalization No Has patient had a PCN reaction occurring within the last 10 years: NO If all of the above answers are "NO", then may proceed with Cephalosporin use.    Review of Systems  Constitutional:  Negative for appetite change, chills, fatigue and fever.  HENT:  Negative for congestion, dental problem, ear pain and sore throat.   Eyes:  Negative for discharge, redness and visual disturbance.  Respiratory:  Negative for cough, chest tightness, shortness of breath and wheezing.   Cardiovascular:  Negative for chest pain, palpitations and leg swelling.  Gastrointestinal:  Negative for abdominal pain, blood in stool, diarrhea, nausea and vomiting.  Genitourinary:  Negative for difficulty urinating, dysuria, flank pain, frequency, hematuria and urgency.  Musculoskeletal:  Negative for arthralgias, back pain, joint swelling and neck stiffness. Myalgias: arms and hands pain and stiffness primarily in mornings. Skin:  Negative for pallor and rash.  Neurological:  Negative for dizziness, speech difficulty, weakness and headaches.  Hematological:  Negative for adenopathy. Does not bruise/bleed easily.  Psychiatric/Behavioral:  Negative for confusion and sleep disturbance. The patient is not nervous/anxious.     PE;    02/29/2024    1:02 PM 02/03/2024   11:20 AM 07/29/2023    1:05 PM  Vitals with BMI  Height 5\' 6"  5\' 7"  5\' 7"   Weight 143 lbs 6 oz 145 lbs 147 lbs 10 oz  BMI 23.16 22.71 23.11  Systolic 113 135 409  Diastolic 70 79 74  Pulse 72 74 75    Gen: Alert, well appearing.  Patient is oriented to person, place, time, and situation. AFFECT: pleasant, lucid thought and speech. ENT: Ears: EACs clear, normal epithelium.  TMs with good light reflex and landmarks bilaterally.  Eyes: no injection, icteris, swelling, or exudate.  EOMI, PERRLA. Nose: no drainage or turbinate edema/swelling.  No injection or focal lesion.  Mouth: lips without lesion/swelling.  Oral  mucosa pink and moist.  Dentition intact and without obvious caries or gingival swelling.  Oropharynx without erythema, exudate, or swelling.  Neck: supple/nontender.  No  LAD, mass, or TM.  Carotid pulses 2+ bilaterally, without bruits. CV: RRR, no m/r/g.   LUNGS: CTA bilat, nonlabored resps, good aeration in all lung fields. ABD: soft, NT, ND, BS normal.  No hepatospenomegaly or mass.  No bruits. EXT: no clubbing, cyanosis, or edema.  Musculoskeletal: Left shoulder with some tenderness to palpation around the posterolateral acromion region.  No AC joint tenderness. She can AB duct to about 70 degrees.  Hawkins and O'Brien's and Neer's all positive.  Speeds and Yergason's negative.  Positive drop sign.  No crepitus.   Skin - no sores or suspicious lesions or rashes or color changes  Pertinent labs:  Lab Results  Component Value Date   TSH 1.74 03/28/2023   Lab Results  Component Value Date   WBC 7.1 03/28/2023   HGB 13.3 03/28/2023   HCT 40.4 03/28/2023   MCV 94.5 03/28/2023   PLT 296.0 03/28/2023   Lab Results  Component Value Date   CREATININE 0.68 03/28/2023   BUN 13 03/28/2023   NA 141 03/28/2023   K 3.6 03/28/2023   CL 101 03/28/2023   CO2 29 03/28/2023   Lab Results  Component Value Date   ALT 5 03/28/2023   AST 15 03/28/2023   ALKPHOS 82 03/28/2023   BILITOT 0.7 03/28/2023   Lab Results  Component Value Date   CHOL 183 11/09/2018   Lab Results  Component Value Date   HDL 58.70 11/09/2018   Lab Results  Component Value Date   LDLCALC 100 (H) 11/09/2018   Lab Results  Component Value Date   TRIG 125.0 11/09/2018   Lab Results  Component Value Date   CHOLHDL 3 11/09/2018   Lab Results  Component Value Date   HGBA1C 5.6 04/18/2023   ASSESSMENT AND PLAN:    #1 Health maintenance exam: Reviewed age and gender appropriate health maintenance issues (prudent diet, regular exercise, health risks of tobacco and excessive alcohol, use of seatbelts, fire  alarms in home, use of sunscreen).  Also reviewed age and gender appropriate health screening as well as vaccine recommendations. Vaccines: Labs: cbc,cmet, lipids, vitamin B12, vitamin D  Vaccines all UTD. Last mammogram was 04/2021.-->pt declines and further breast ca screening. She declines any further colon cancer screening  #2 hypertension, well-controlled on amlodipine  5 mg a day. Monitoring electrolytes and creatinine today.  #3 vitamin B12 deficiency. She takes vitamin B12 daily. We will check level today.  #4 vitamin D  deficiency. She takes 50,000 unit capsule daily long-term. Check vitamin D  level today.  #5 Chronic L shoulder pain d/t osteoarthritis and chronic RC tear.  She has seen orthopedics in the past and they recommended reverse total shoulder arthroplasty. She declined.  She continues to decline any invasive/aggressive treatment for this problem. She has gotten some benefit from injection in the past and that is what she wanted to do today.  Ultrasound-guided injection is preferred based on studies that show increased duration, increased effect, greater accuracy, decreased procedural pain, increased response rate, and decreased cost with ultrasound-guided versus blind injection. Procedure: Real-time ultrasound guided injection of left subacromial/subdeltoid bursa. Device: GE [employer] informed consent obtained.  Timeout conducted.  No overlying erythema, induration, or other signs of local infection. After sterile prep with Betadine, injected 3 mL of 1% lidocaine  without epi for local anesthesia.  This was followed by a mixture of 3 mL 1% lidocaine  and 40 mg Kenalog .  Injectate seen filling subacromial bursa. Patient tolerated the procedure well.  No immediate complications.  Post-injection care discussed. Advised to call if fever/chills, erythema, drainage, or persistent bleeding.  Impression: Technically successful ultrasound-guided injection.  An After Visit  Summary was printed and given to the patient.  FOLLOW UP:  Return in about 6 months (around 08/31/2024) for routine chronic illness f/u.  Signed:  Arletha Lady, MD           02/29/2024

## 2024-03-01 ENCOUNTER — Encounter: Payer: Self-pay | Admitting: Family Medicine

## 2024-03-08 ENCOUNTER — Encounter: Admitting: Family Medicine

## 2024-03-26 ENCOUNTER — Encounter: Payer: Self-pay | Admitting: Adult Health

## 2024-03-26 ENCOUNTER — Ambulatory Visit: Admitting: Adult Health

## 2024-03-26 VITALS — BP 164/90 | HR 77 | Ht 65.5 in | Wt 139.0 lb

## 2024-03-26 DIAGNOSIS — T8389XA Other specified complication of genitourinary prosthetic devices, implants and grafts, initial encounter: Secondary | ICD-10-CM | POA: Diagnosis not present

## 2024-03-26 DIAGNOSIS — I10 Essential (primary) hypertension: Secondary | ICD-10-CM | POA: Diagnosis not present

## 2024-03-26 DIAGNOSIS — N898 Other specified noninflammatory disorders of vagina: Secondary | ICD-10-CM

## 2024-03-26 DIAGNOSIS — N362 Urethral caruncle: Secondary | ICD-10-CM

## 2024-03-26 DIAGNOSIS — Z960 Presence of urogenital implants: Secondary | ICD-10-CM | POA: Diagnosis not present

## 2024-03-26 DIAGNOSIS — Z4689 Encounter for fitting and adjustment of other specified devices: Secondary | ICD-10-CM

## 2024-03-26 MED ORDER — ESTRADIOL 0.1 MG/GM VA CREA: TOPICAL_CREAM | VAGINAL | 1 refills | Status: AC

## 2024-03-26 NOTE — Progress Notes (Signed)
  Subjective:     Patient ID: Karen Delacruz, female   DOB: Apr 27, 1941, 83 y.o.   MRN: [id number]  HPI Karen Delacruz is a 83 year old white female,married, PM, in for ?bruising near cervix has pessary. She takes pessary out and cleans every 4 months, but had noticed blood when wiped and saw [name] who noticed ?bruising. Her daughter is with her.  Review of Systems Had blood when wiped, ?bruising near cervix, has pessary Reviewed past medical,surgical, social and family history. Reviewed medications and allergies.     Objective:   Physical Exam BP (!) 164/90 (BP Location: Left Arm, Patient Position: Sitting, Cuff Size: Normal)   Pulse 77   Ht 5' 5.5" (1.664 m)   Wt 139 lb (63 kg)   BMI 22.78 kg/m     Skin warm and dry.Pelvic: external genitalia is normal in appearance no lesions, vagina: pessary,milex ring with support # 3, removed,has vaginal irritation with bleeding,+cystocele, urethra has caruncle, cervix: poorly seen, uterus: normal size, shape and contour, non tender, no masses felt, adnexa: no masses or tenderness noted. Bladder is non tender and no masses felt.  Dr Ozan in for co exam, pessary was cleaned with soap and water  and easily reinserted.   AA is 0    03/26/2024   10:38 AM 02/29/2024    1:05 PM 02/22/2024    3:45 PM  Depression screen PHQ 2/9  Decreased Interest 0 0 0  Down, Depressed, Hopeless 0 0 0  PHQ - 2 Score 0 0 0  Altered sleeping 0 0 0  Tired, decreased energy 0 0 0  Change in appetite 0 0 0  Feeling bad or failure about yourself  0 0 0  Trouble concentrating 0 0 0  Moving slowly or fidgety/restless 0 0 0  Suicidal thoughts 0 0 0  PHQ-9 Score 0 0 0  Difficult doing work/chores  Not difficult at all Not difficult at all       03/26/2024   10:38 AM  GAD 7 : Generalized Anxiety Score  Nervous, Anxious, on Edge 1  Control/stop worrying 0  Worry too much - different things 0  Trouble relaxing 0  Restless 0  Easily annoyed or irritable 0  Afraid -  awful might happen 0  Total GAD 7 Score 1    Upstream - 03/26/24 1033       Pregnancy Intention Screening   Does the patient want to become pregnant in the next year? N/A    Does the patient's partner want to become pregnant in the next year? N/A    Would the patient like to discuss contraceptive options today? N/A      Contraception Wrap Up   Current Method Abstinence   PM   End Method Abstinence   PM   Contraception Counseling Provided No            Examination chaperoned by Alphonso Aschoff LPN    Assessment:     1. Vaginal irritation from pessary (HCC) (Primary) +irritation and bleeding Will try using estrace vaginal cream, pea sized amount in vagina at bedtime weekly Return in 4 weeks to recheck to see if healed, if not can take pessary out for a while   2. Pessary maintenance Cleaned and reinserted   3. Urethral caruncle  4. Hypertension, unspecified type Go home and take meds, follow up with PCP    Plan:     Return in 4 weeks for recheck

## 2024-04-23 ENCOUNTER — Ambulatory Visit: Admitting: Adult Health

## 2024-04-23 ENCOUNTER — Encounter: Payer: Self-pay | Admitting: Adult Health

## 2024-04-23 VITALS — BP 148/88 | HR 80 | Ht 67.0 in | Wt 136.0 lb

## 2024-04-23 DIAGNOSIS — N362 Urethral caruncle: Secondary | ICD-10-CM | POA: Diagnosis not present

## 2024-04-23 DIAGNOSIS — Z4689 Encounter for fitting and adjustment of other specified devices: Secondary | ICD-10-CM | POA: Diagnosis not present

## 2024-04-23 DIAGNOSIS — I10 Essential (primary) hypertension: Secondary | ICD-10-CM

## 2024-04-23 DIAGNOSIS — N898 Other specified noninflammatory disorders of vagina: Secondary | ICD-10-CM

## 2024-04-23 DIAGNOSIS — T8389XA Other specified complication of genitourinary prosthetic devices, implants and grafts, initial encounter: Secondary | ICD-10-CM | POA: Diagnosis not present

## 2024-04-23 NOTE — Progress Notes (Signed)
  Subjective:     Patient ID: Karen Delacruz, female   DOB: 1941/03/16, 83 y.o.   MRN: [id number]  HPI Karen Delacruz is a 83 year old white female,married. PM, back in follow up on using estrace  cream had vaginal irritation from pessary, and pessary maintenance.Last seen 4 weeks ago.  Has not had any bleeding.  PCP is Dr Candise    Review of Systems Denies any bleeding Reviewed past medical,surgical, social and family history. Reviewed medications and allergies.     Objective:   Physical Exam BP (!) 148/88 (BP Location: Right Arm, Patient Position: Sitting, Cuff Size: Normal)   Pulse 80   Ht 5' 7 (1.702 m)   Wt 136 lb (61.7 kg)   BMI 21.30 kg/m     Skin warm and dry.Pelvic: external genitalia is normal in appearance no lesions, vagina: pessary,milex ring with support # 3, removed,washed and dried,has vaginal irritation, no bleeding,+cystocele, urethra has caruncle, cervix: smooth, uterus: normal size, shape and contour, non tender, no masses felt, adnexa: no masses or tenderness noted. Bladder is non tender and no masses. Will leave pessary out for for now, placed in bag and gave to her daughter   Up[facility] - 04/23/24 1100       Pregnancy Intention Screening   Does the patient want to become pregnant in the next year? N/A    Does the patient's partner want to become pregnant in the next year? N/A    Would the patient like to discuss contraceptive options today? N/A      Contraception Wrap Up   Current Method Abstinence   PM   End Method Abstinence   PM   Contraception Counseling Provided No         Karen Delacruz CNM student, was chaperone.  Assessment:     1. Vaginal irritation from pessary (HCC) (Primary) Will leave pessary out and recheck 7/10 Use estrace  vaginal cream once a week  2. Pessary maintenance Cleaned and removed for now  3. Urethral caruncle  4.Hypertension Take meds and follow up with PCP    Plan:     Return 05/03/24 to recheck vaginal irritation and if  better reinsert pessary

## 2024-04-24 ENCOUNTER — Encounter: Payer: Self-pay | Admitting: Family Medicine

## 2024-04-24 ENCOUNTER — Telehealth: Payer: Self-pay

## 2024-04-24 DIAGNOSIS — M810 Age-related osteoporosis without current pathological fracture: Secondary | ICD-10-CM

## 2024-04-24 NOTE — Telephone Encounter (Signed)
 Imaging ordered, her last Prolia  was 11/07/23.

## 2024-04-24 NOTE — Addendum Note (Signed)
 Addended by: FLETA CARE D on: 04/24/2024 01:38 PM   Modules accepted: Orders

## 2024-04-24 NOTE — Telephone Encounter (Signed)
 Copied from CRM [id number]. Topic: Clinical - Request for Lab/Test Order >> Apr 23, 2024 12:54 PM Henretta I wrote: Reason for CRM: Patients daughter called in to request a bone density test for mom. Daughter would like a callback to schedule at  [phone]  Pt's last bone density 05/14/21, okay for order?

## 2024-04-24 NOTE — Telephone Encounter (Signed)
 Please order DEXA at [facility] radiology, diagnosis osteoporosis. When was her last injection of Prolia ?

## 2024-05-01 ENCOUNTER — Ambulatory Visit (HOSPITAL_COMMUNITY)
Admission: RE | Admit: 2024-05-01 | Discharge: 2024-05-01 | Disposition: A | Discharge status: Home / Self Care | Source: Ambulatory Visit | Attending: Family Medicine | Admitting: Family Medicine

## 2024-05-01 DIAGNOSIS — Z780 Asymptomatic menopausal state: Secondary | ICD-10-CM | POA: Diagnosis not present

## 2024-05-01 DIAGNOSIS — M810 Age-related osteoporosis without current pathological fracture: Secondary | ICD-10-CM | POA: Diagnosis not present

## 2024-05-02 ENCOUNTER — Encounter: Payer: Self-pay | Admitting: Family Medicine

## 2024-05-02 ENCOUNTER — Ambulatory Visit: Payer: Self-pay | Admitting: Family Medicine

## 2024-05-02 DIAGNOSIS — M810 Age-related osteoporosis without current pathological fracture: Secondary | ICD-10-CM

## 2024-05-03 ENCOUNTER — Ambulatory Visit

## 2024-05-03 ENCOUNTER — Encounter: Payer: Self-pay | Admitting: Adult Health

## 2024-05-03 ENCOUNTER — Ambulatory Visit: Admitting: Family Medicine

## 2024-05-03 ENCOUNTER — Ambulatory Visit: Admitting: Adult Health

## 2024-05-03 VITALS — BP 118/81 | HR 79 | Ht 67.0 in | Wt 136.0 lb

## 2024-05-03 DIAGNOSIS — N898 Other specified noninflammatory disorders of vagina: Secondary | ICD-10-CM

## 2024-05-03 DIAGNOSIS — T8389XA Other specified complication of genitourinary prosthetic devices, implants and grafts, initial encounter: Secondary | ICD-10-CM | POA: Diagnosis not present

## 2024-05-03 NOTE — Progress Notes (Signed)
  Subjective:     Patient ID: Karen Delacruz, female   DOB: August 28, 1941, 83 y.o.   MRN: [id number]  HPI Karen Delacruz is a 83 year old white female, married, PM back in follow up on vaginal irritation and leaving pessary out, since 04/23/24 and she feels so much better.  PCP is Dr Johnita Clack  Review of Systems Denies any odor or discharge in panties and feels better with pessary out Reviewed past medical,surgical, social and family history. Reviewed medications and allergies.     Objective:   Physical Exam BP 118/81 (BP Location: Left Arm, Patient Position: Sitting, Cuff Size: Normal)   Pulse 79   Ht 5' 7 (1.702 m)   Wt 136 lb (61.7 kg)   BMI 21.30 kg/m     Skin warm and dry.Pelvic: external genitalia is normal in appearance no lesions, vagina: scant pink discharge, with some irritation still,urethra has caruncle, cervix:smooth, uterus: normal size, shape and contour, non tender, no masses felt, adnexa: no masses or tenderness noted. Bladder is non tender and no masses felt. Examination chaperoned by Clarita Salt LPN  Upstream - 05/03/24 1124       Pregnancy Intention Screening   Does the patient want to become pregnant in the next year? N/A    Does the patient's partner want to become pregnant in the next year? N/A    Would the patient like to discuss contraceptive options today? N/A      Contraception Wrap Up   Current Method Abstinence   PM   End Method Abstinence   PM   Contraception Counseling Provided No          Assessment:     1. Vaginal irritation from pessary (HCC) (Primary)  Use estrace  cream once a week Will leave pessary out for now and recheck in 4 weeks    Call me with any concerns  Plan:     Follow up in 4 weeks for recheck

## 2024-05-07 ENCOUNTER — Telehealth: Payer: Self-pay

## 2024-05-07 ENCOUNTER — Ambulatory Visit (INDEPENDENT_AMBULATORY_CARE_PROVIDER_SITE_OTHER)

## 2024-05-07 DIAGNOSIS — M810 Age-related osteoporosis without current pathological fracture: Secondary | ICD-10-CM

## 2024-05-07 MED ORDER — DENOSUMAB 60 MG/ML ~~LOC~~ SOSY
60.0000 mg | PREFILLED_SYRINGE | SUBCUTANEOUS | Status: AC
  Administered 2024-05-07: 60 mg via SUBCUTANEOUS

## 2024-05-07 NOTE — Telephone Encounter (Signed)
 RECEIVED PROLIA  INJECTION ON 05/07/24.

## 2024-05-07 NOTE — Progress Notes (Signed)
 Pt in for Prolia  injection per Dr. McGowen.   Injection tolerated well  Next injection scheduled for 6 months.

## 2024-05-31 ENCOUNTER — Ambulatory Visit: Admitting: Adult Health

## 2024-06-04 ENCOUNTER — Ambulatory Visit: Payer: Self-pay | Admitting: "Endocrinology

## 2024-10-09 ENCOUNTER — Telehealth: Payer: Self-pay

## 2024-10-09 NOTE — Telephone Encounter (Signed)
 Pt is needing updated prior auth for Prolia  injection

## 2024-10-09 NOTE — Telephone Encounter (Signed)
 FYI reviewed

## 2024-10-09 NOTE — Telephone Encounter (Addendum)
 Patient not due for Prolia  until 11/07/24. Will verify insurance after January 1st for 2026 benefits.

## 2024-10-30 ENCOUNTER — Other Ambulatory Visit (HOSPITAL_COMMUNITY): Payer: Self-pay

## 2024-10-30 ENCOUNTER — Telehealth: Payer: Self-pay

## 2024-10-30 NOTE — Telephone Encounter (Signed)
 Prolia  VOB initiated via [email or web address]  Next Prolia  inj DUE: 11/07/24

## 2024-10-31 ENCOUNTER — Other Ambulatory Visit (HOSPITAL_COMMUNITY): Payer: Self-pay

## 2024-10-31 ENCOUNTER — Encounter: Payer: Self-pay | Admitting: Family Medicine

## 2024-10-31 NOTE — Telephone Encounter (Signed)
 Karen Delacruz

## 2024-10-31 NOTE — Telephone Encounter (Signed)
 Pt ready for scheduling for PROLIA  on or after : 11/07/24  Option# 1: Buy/Bill (Office supplied medication)  Out-of-pocket cost due at time of clinic visit: $377  Number of injection/visits approved: 2  Primary: [employer] Prolia  co-insurance: 20% Admin fee co-insurance: 20%  Secondary: --- Prolia  co-insurance:  Admin fee co-insurance:   Medical Benefit Details: Date Benefits were checked: 10/31/24 Deductible: NO/ Coinsurance: 20%/ Admin Fee: 20%  Prior Auth: APPROVED PA# [id number] Expiration Date: 10/25/24-10/24/25  # of doses approved: 2 ----------------------------------------------------------------------- Option# 2- Med Obtained from pharmacy:  Pharmacy benefit: Copay $64 (Paid to pharmacy) Admin Fee: 20% (Pay at clinic)  Prior Auth: N/A PA# Expiration Date:   # of doses approved:   If patient wants fill through the pharmacy benefit please send prescription to: [facility], and include estimated need by date in rx notes. Pharmacy will ship medication directly to the office.  Patient NOT eligible for Prolia  Copay Card. Copay Card can make patient's cost as little as $25. Link to apply: [email or web address]://www.[email or web address].[email or web address]/copay  ** This summary of benefits is an estimation of the patient's out-of-pocket cost. Exact cost may very based on individual plan coverage.

## 2024-10-31 NOTE — Telephone Encounter (Signed)
" ° °  [employer] cannot give estimate, following Medicare Advantage guideline (20% coinsurance, 20% admin fee)   "

## 2024-11-20 ENCOUNTER — Other Ambulatory Visit: Payer: Self-pay

## 2024-11-20 MED ORDER — AMLODIPINE BESYLATE 5 MG PO TABS: 5.0000 mg | ORAL_TABLET | Freq: Every day | ORAL | 1 refills | Status: AC

## 2024-12-27 ENCOUNTER — Ambulatory Visit: Admitting: Family Medicine

## 2025-02-27 ENCOUNTER — Encounter
